# Patient Record
Sex: Male | Born: 1935 | Race: White | Hispanic: No | Smoking: Former smoker
Health system: Southern US, Community
[De-identification: ages and names within clinical notes are randomized; demographics above are authoritative.]

## PROBLEM LIST (undated history)

## (undated) DIAGNOSIS — I10 Essential (primary) hypertension: Secondary | ICD-10-CM

## (undated) DIAGNOSIS — E785 Hyperlipidemia, unspecified: Secondary | ICD-10-CM

## (undated) DIAGNOSIS — N400 Enlarged prostate without lower urinary tract symptoms: Secondary | ICD-10-CM

## (undated) DIAGNOSIS — H409 Unspecified glaucoma: Secondary | ICD-10-CM

## (undated) DIAGNOSIS — I340 Nonrheumatic mitral (valve) insufficiency: Secondary | ICD-10-CM

## (undated) DIAGNOSIS — I509 Heart failure, unspecified: Secondary | ICD-10-CM

## (undated) DIAGNOSIS — I480 Paroxysmal atrial fibrillation: Secondary | ICD-10-CM

## (undated) DIAGNOSIS — I071 Rheumatic tricuspid insufficiency: Secondary | ICD-10-CM

## (undated) DIAGNOSIS — I351 Nonrheumatic aortic (valve) insufficiency: Secondary | ICD-10-CM

## (undated) HISTORY — DX: Nonrheumatic aortic (valve) insufficiency: I35.1

## (undated) HISTORY — PX: PILONIDAL CYST / SINUS EXCISION: SUR543

## (undated) HISTORY — DX: Rheumatic tricuspid insufficiency: I07.1

## (undated) HISTORY — DX: Nonrheumatic mitral (valve) insufficiency: I34.0

## (undated) HISTORY — DX: Paroxysmal atrial fibrillation: I48.0

## (undated) HISTORY — PX: EYE SURGERY: SHX253

---

## 1998-12-25 ENCOUNTER — Other Ambulatory Visit: Admission: RE | Admit: 1998-12-25 | Discharge: 1998-12-25 | Payer: Self-pay | Admitting: Urology

## 2001-12-28 ENCOUNTER — Ambulatory Visit (HOSPITAL_COMMUNITY): Admission: RE | Admit: 2001-12-28 | Discharge: 2001-12-28 | Payer: Self-pay | Admitting: Gastroenterology

## 2011-05-08 NOTE — Procedures (Signed)
[facility]  Patient:    WES, LEZOTTE Visit Number: [id number] MRN: [id number]          Service Type: END Location: ENDO Attending Physician:  Rich Brave Dictated by:   Florencia Reasons, M.D. Proc. Date: 12/28/01 Admit Date:  12/28/2001   CC:         Al Decant. Janey Greaser, M.D.   Procedure Report  PROCEDURE:  Colonoscopy.  SURGEON:  Florencia Reasons, M.D.  INDICATIONS:  Screening for colon cancer in a 77 year old gentleman.  FINDINGS:  Pancolonic diverticulosis.  DESCRIPTION OF PROCEDURE:  The nature, purpose, and risks of the procedure had been discussed with the patient who provided written consent.  Sedation was fentanyl 75 mcg and Versed 9 mg IV without arrhythmias or desaturation. Digital exam of the prostate was unremarkable.  The Olympus adult video colonoscope was advanced without significant difficulty to the cecum as identified by clear visualization of the appendiceal orifice, after which pull back was initiated.  The prep was very good with just a little bit of residual stool film here and there which was irrigated away when necessary for adequate visualization.  The main finding on this exam was extensive pancolonic diverticulosis.  No polyps, cancer, colitis, or vascular malformations were observed, and retroflexion in the rectum was normal.  No biopsies were obtained.  The patient tolerated the procedure well and there were no apparent complications.  IMPRESSION:  Pancolonic diverticulosis, otherwise normal examination to the cecum.  PLAN:  Consider screening flexible sigmoidoscopy in five years and perhaps a follow up colonoscopy in 10 years if the patient remains in good general health. Dictated by:   Florencia Reasons, M.D. Attending Physician:  Rich Brave DD:  12/28/01 TD:  12/28/01 Job: [id number]

## 2013-08-31 ENCOUNTER — Other Ambulatory Visit (HOSPITAL_COMMUNITY): Payer: Self-pay | Admitting: Family Medicine

## 2013-08-31 DIAGNOSIS — R42 Dizziness and giddiness: Secondary | ICD-10-CM

## 2013-08-31 DIAGNOSIS — R011 Cardiac murmur, unspecified: Secondary | ICD-10-CM

## 2013-09-04 ENCOUNTER — Ambulatory Visit (HOSPITAL_COMMUNITY)
Admission: RE | Admit: 2013-09-04 | Discharge: 2013-09-04 | Disposition: A | Discharge status: Home / Self Care | Payer: Medicare Other | Source: Ambulatory Visit | Attending: Family Medicine | Admitting: Family Medicine

## 2013-09-04 DIAGNOSIS — R011 Cardiac murmur, unspecified: Secondary | ICD-10-CM

## 2013-09-04 DIAGNOSIS — R42 Dizziness and giddiness: Secondary | ICD-10-CM

## 2013-09-04 NOTE — Progress Notes (Signed)
  Echocardiogram 2D Echocardiogram has been performed.  Barry Burgess FRANCES 09/04/2013, 2:55 PM

## 2015-10-14 ENCOUNTER — Encounter (HOSPITAL_COMMUNITY): Payer: Self-pay | Admitting: Emergency Medicine

## 2015-10-14 ENCOUNTER — Inpatient Hospital Stay (HOSPITAL_COMMUNITY)
Admission: EM | Admit: 2015-10-14 | Discharge: 2015-10-16 | DRG: 309 | Disposition: A | Discharge status: Home / Self Care | Payer: Medicare Other | Attending: Internal Medicine | Admitting: Internal Medicine

## 2015-10-14 ENCOUNTER — Emergency Department (HOSPITAL_COMMUNITY): Payer: Medicare Other

## 2015-10-14 DIAGNOSIS — E785 Hyperlipidemia, unspecified: Secondary | ICD-10-CM | POA: Diagnosis present

## 2015-10-14 DIAGNOSIS — I4891 Unspecified atrial fibrillation: Secondary | ICD-10-CM

## 2015-10-14 DIAGNOSIS — I10 Essential (primary) hypertension: Secondary | ICD-10-CM | POA: Diagnosis present

## 2015-10-14 DIAGNOSIS — Z87891 Personal history of nicotine dependence: Secondary | ICD-10-CM | POA: Diagnosis not present

## 2015-10-14 DIAGNOSIS — Z7982 Long term (current) use of aspirin: Secondary | ICD-10-CM | POA: Diagnosis not present

## 2015-10-14 DIAGNOSIS — N400 Enlarged prostate without lower urinary tract symptoms: Secondary | ICD-10-CM | POA: Diagnosis present

## 2015-10-14 DIAGNOSIS — I169 Hypertensive crisis, unspecified: Secondary | ICD-10-CM | POA: Diagnosis present

## 2015-10-14 DIAGNOSIS — Z79899 Other long term (current) drug therapy: Secondary | ICD-10-CM | POA: Diagnosis not present

## 2015-10-14 DIAGNOSIS — H409 Unspecified glaucoma: Secondary | ICD-10-CM | POA: Diagnosis not present

## 2015-10-14 HISTORY — DX: Essential (primary) hypertension: I10

## 2015-10-14 HISTORY — DX: Unspecified atrial fibrillation: I48.91

## 2015-10-14 HISTORY — DX: Hyperlipidemia, unspecified: E78.5

## 2015-10-14 HISTORY — DX: Unspecified glaucoma: H40.9

## 2015-10-14 HISTORY — DX: Benign prostatic hyperplasia without lower urinary tract symptoms: N40.0

## 2015-10-14 LAB — HEPATIC FUNCTION PANEL
ALBUMIN: 4 g/dL (ref 3.5–5.0)
ALK PHOS: 58 U/L (ref 38–126)
ALT: 17 U/L (ref 17–63)
AST: 22 U/L (ref 15–41)
Bilirubin, Direct: 0.2 mg/dL (ref 0.1–0.5)
Indirect Bilirubin: 0.9 mg/dL (ref 0.3–0.9)
TOTAL PROTEIN: 6.5 g/dL (ref 6.5–8.1)
Total Bilirubin: 1.1 mg/dL (ref 0.3–1.2)

## 2015-10-14 LAB — BASIC METABOLIC PANEL
ANION GAP: 5 (ref 5–15)
BUN: 17 mg/dL (ref 6–20)
CALCIUM: 8.7 mg/dL — AB (ref 8.9–10.3)
CO2: 27 mmol/L (ref 22–32)
CREATININE: 1.06 mg/dL (ref 0.61–1.24)
Chloride: 110 mmol/L (ref 101–111)
GFR calc Af Amer: >60 mL/min (ref >60)
GLUCOSE: 145 mg/dL — AB (ref 65–99)
Potassium: 4.2 mmol/L (ref 3.5–5.1)
Sodium: 142 mmol/L (ref 135–145)

## 2015-10-14 LAB — TSH: TSH: 1.487 u[IU]/mL (ref 0.350–4.500)

## 2015-10-14 LAB — CBC
HCT: 43.4 % (ref 39.0–52.0)
Hemoglobin: 14 g/dL (ref 13.0–17.0)
MCH: 30.8 pg (ref 26.0–34.0)
MCHC: 32.3 g/dL (ref 30.0–36.0)
MCV: 95.4 fL (ref 78.0–100.0)
PLATELETS: 170 10*3/uL (ref 150–400)
RBC: 4.55 MIL/uL (ref 4.22–5.81)
RDW: 13.5 % (ref 11.5–15.5)
WBC: 5.6 10*3/uL (ref 4.0–10.5)

## 2015-10-14 LAB — T4, FREE: Free T4: 1.25 ng/dL — ABNORMAL HIGH (ref 0.61–1.12)

## 2015-10-14 LAB — PROTIME-INR
INR: 1.09 (ref 0.00–1.49)
PROTHROMBIN TIME: 14.3 s (ref 11.6–15.2)

## 2015-10-14 LAB — TROPONIN I
Troponin I: <0.03 ng/mL (ref <0.031)
Troponin I: <0.03 ng/mL (ref <0.031)

## 2015-10-14 LAB — APTT: APTT: 31 s (ref 24–37)

## 2015-10-14 LAB — MRSA PCR SCREENING: MRSA by PCR: NEGATIVE

## 2015-10-14 LAB — PHOSPHORUS: Phosphorus: 3 mg/dL (ref 2.5–4.6)

## 2015-10-14 LAB — MAGNESIUM: MAGNESIUM: 1.8 mg/dL (ref 1.7–2.4)

## 2015-10-14 LAB — BRAIN NATRIURETIC PEPTIDE: B NATRIURETIC PEPTIDE 5: 345.1 pg/mL — AB (ref 0.0–100.0)

## 2015-10-14 MED ORDER — LATANOPROST 0.005 % OP SOLN
1.0000 [drp] | Freq: Every day | OPHTHALMIC | Status: DC
  Filled 2015-10-14: qty 2.5

## 2015-10-14 MED ORDER — DILTIAZEM LOAD VIA INFUSION
10.0000 mg | Freq: Once | INTRAVENOUS | Status: AC
  Administered 2015-10-14: 10 mg via INTRAVENOUS
  Filled 2015-10-14: qty 10

## 2015-10-14 MED ORDER — ACETAMINOPHEN 650 MG RE SUPP: 650.0000 mg | Freq: Four times a day (QID) | RECTAL | Status: DC | PRN

## 2015-10-14 MED ORDER — FINASTERIDE 5 MG PO TABS
5.0000 mg | ORAL_TABLET | Freq: Every day | ORAL | Status: DC
  Administered 2015-10-15 – 2015-10-16 (×2): 5 mg via ORAL
  Filled 2015-10-14 (×2): qty 1

## 2015-10-14 MED ORDER — ONDANSETRON HCL 4 MG PO TABS: 4.0000 mg | ORAL_TABLET | Freq: Four times a day (QID) | ORAL | Status: DC | PRN

## 2015-10-14 MED ORDER — ASPIRIN EC 81 MG PO TBEC
81.0000 mg | DELAYED_RELEASE_TABLET | Freq: Every day | ORAL | Status: DC
  Administered 2015-10-14 – 2015-10-15 (×2): 81 mg via ORAL
  Filled 2015-10-14 (×4): qty 1

## 2015-10-14 MED ORDER — HEPARIN (PORCINE) IN NACL 100-0.45 UNIT/ML-% IJ SOLN
1050.0000 [IU]/h | INTRAMUSCULAR | Status: AC
  Administered 2015-10-14: 1200 [IU]/h via INTRAVENOUS
  Administered 2015-10-15: 1050 [IU]/h via INTRAVENOUS
  Filled 2015-10-14 (×3): qty 250

## 2015-10-14 MED ORDER — HEPARIN BOLUS VIA INFUSION
4000.0000 [IU] | Freq: Once | INTRAVENOUS | Status: AC
  Administered 2015-10-14: 4000 [IU] via INTRAVENOUS
  Filled 2015-10-14: qty 4000

## 2015-10-14 MED ORDER — DOXAZOSIN MESYLATE 4 MG PO TABS
4.0000 mg | ORAL_TABLET | Freq: Every day | ORAL | Status: DC
  Administered 2015-10-14 – 2015-10-15 (×2): 4 mg via ORAL
  Filled 2015-10-14: qty 1
  Filled 2015-10-14 (×2): qty 4
  Filled 2015-10-14: qty 1

## 2015-10-14 MED ORDER — ONDANSETRON HCL 4 MG/2ML IJ SOLN: 4.0000 mg | Freq: Four times a day (QID) | INTRAMUSCULAR | Status: DC | PRN

## 2015-10-14 MED ORDER — DORZOLAMIDE HCL-TIMOLOL MAL 2-0.5 % OP SOLN
1.0000 [drp] | Freq: Two times a day (BID) | OPHTHALMIC | Status: DC
  Administered 2015-10-14 – 2015-10-16 (×4): 1 [drp] via OPHTHALMIC
  Filled 2015-10-14: qty 10

## 2015-10-14 MED ORDER — ICAPS PO CAPS: 1.0000 | ORAL_CAPSULE | Freq: Every day | ORAL | Status: DC

## 2015-10-14 MED ORDER — SODIUM CHLORIDE 0.9 % IJ SOLN: 3.0000 mL | INTRAMUSCULAR | Status: DC | PRN

## 2015-10-14 MED ORDER — SODIUM CHLORIDE 0.9 % IV SOLN
250.0000 mL | INTRAVENOUS | Status: DC | PRN
Start: 2015-10-14 — End: 2015-10-16

## 2015-10-14 MED ORDER — SODIUM CHLORIDE 0.9 % IJ SOLN
3.0000 mL | Freq: Two times a day (BID) | INTRAMUSCULAR | Status: DC
  Administered 2015-10-15 – 2015-10-16 (×2): 3 mL via INTRAVENOUS

## 2015-10-14 MED ORDER — PROSIGHT PO TABS
1.0000 | ORAL_TABLET | Freq: Every day | ORAL | Status: DC
  Administered 2015-10-16: 1 via ORAL
  Filled 2015-10-14 (×2): qty 1

## 2015-10-14 MED ORDER — ATORVASTATIN CALCIUM 40 MG PO TABS
40.0000 mg | ORAL_TABLET | Freq: Every day | ORAL | Status: DC
  Administered 2015-10-15: 40 mg via ORAL
  Filled 2015-10-14: qty 1

## 2015-10-14 MED ORDER — ACETAMINOPHEN 325 MG PO TABS: 650.0000 mg | ORAL_TABLET | Freq: Four times a day (QID) | ORAL | Status: DC | PRN

## 2015-10-14 MED ORDER — DILTIAZEM HCL 100 MG IV SOLR
5.0000 mg/h | INTRAVENOUS | Status: DC
  Administered 2015-10-14: 12.5 mg/h via INTRAVENOUS
  Administered 2015-10-14: 10 mg/h via INTRAVENOUS
  Filled 2015-10-14 (×2): qty 100

## 2015-10-14 NOTE — ED Notes (Signed)
Lenis DickinsonAlaina, RN 2nd RN verified Heparin administration.

## 2015-10-14 NOTE — Progress Notes (Signed)
ANTICOAGULATION CONSULT NOTE - Initial Consult  Pharmacy Consult for Heparin Indication: atrial fibrillation  No Known Allergies  Patient Measurements: Height: 6' (182.9 cm) Weight: 175 lb (79.379 kg) IBW/kg (Calculated) : 77.6 Heparin Dosing Weight: 79 kg   Vital Signs: Temp: 97.9 F (36.6 C) (10/24 1554) Temp Source: Oral (10/24 1554) BP: 161/115 mmHg (10/24 1638) Pulse Rate: 119 (10/24 1638)  Labs: No results for input(s): HGB, HCT, PLT, APTT, LABPROT, INR, HEPARINUNFRC, CREATININE, CKTOTAL, CKMB, TROPONINI in the last 72 hours.  CrCl cannot be calculated (Patient has no serum creatinine result on file.).   Medical History: Past Medical History  Diagnosis Date  . Hypertension   . Hyperlipidemia     Medications:  Scheduled:  . heparin  4,000 Units Intravenous Once   Infusions:  . diltiazem (CARDIZEM) infusion 10 mg/hr (10/14/15 1628)  . heparin      Assessment:  [age] yr male sent by primary MD to ED due to rapid and irregular heart rate with hypertension.  EKG shows AFib with RVR  IV Heparin per pharmacy dosing ordered  Baseline aPTT, INR and CBC already ordered  Per medication history, patient not on oral anticoagulation prior to admission  Goal of Therapy:  Heparin level 0.3-0.7 units/ml Monitor platelets by anticoagulation protocol: Yes   Plan:  Heparin 4000 unit IV bolus x 1 Heparin infusion @ 1200 units/hr Check heparin level 8 hr after heparin started Follow heparin level & CBC daily while on heparin  Kani Jobson, Joselyn GlassmanLeann Trefz, PharmD 10/14/2015,4:41 PM

## 2015-10-14 NOTE — ED Provider Notes (Addendum)
CSN: [id number]     Arrival date & time 10/14/15  1530 History   First MD Initiated Contact with Patient 10/14/15 1558     Chief Complaint  Patient presents with  . elevated HR/BP      (Consider location/radiation/quality/duration/timing/severity/associated sxs/prior Treatment) HPI Comments: Patient presents to the emergency department as a referral from his primary care doctor. Patient was seen in his doctor's office earlier and was found to have a rapid and irregular heart rate as well as hypertension. Patient reports that his ankles have been more swollen than usual but he is not experiencing any chest pain, palpitations, shortness of breath. He was recently taken off his hydrochlorothiazide and had his Cardura dose cut in half.   Past Medical History  Diagnosis Date  . Hypertension   . Hyperlipidemia    History reviewed. No pertinent past surgical history. No family history on file. Social History  Substance Use Topics  . Smoking status: Never Smoker   . Smokeless tobacco: None  . Alcohol Use: No    Review of Systems  Respiratory: Negative for shortness of breath.   Cardiovascular: Positive for leg swelling. Negative for chest pain.  All other systems reviewed and are negative.     Allergies  Review of patient's allergies indicates not on file.  Home Medications   Prior to Admission medications   Not on File   BP 178/118 mmHg  Pulse 143  Temp(Src) 97.9 F (36.6 C) (Oral)  Resp 18  SpO2 100% Physical Exam  Constitutional: He is oriented to person, place, and time. He appears well-developed and well-nourished. No distress.  HENT:  Head: Normocephalic and atraumatic.  Right Ear: Hearing normal.  Left Ear: Hearing normal.  Nose: Nose normal.  Mouth/Throat: Oropharynx is clear and moist and mucous membranes are normal.  Eyes: Conjunctivae and EOM are normal. Pupils are equal, round, and reactive to light.  Neck: Normal range of motion. Neck supple.   Cardiovascular: S1 normal and S2 normal.  An irregularly irregular rhythm present. Tachycardia present.  Exam reveals no gallop and no friction rub.   No murmur heard. Pulmonary/Chest: Effort normal and breath sounds normal. No respiratory distress. He exhibits no tenderness.  Abdominal: Soft. Normal appearance and bowel sounds are normal. There is no hepatosplenomegaly. There is no tenderness. There is no rebound, no guarding, no tenderness at McBurney's point and negative Murphy's sign. No hernia.  Musculoskeletal: Normal range of motion. He exhibits edema.  Neurological: He is alert and oriented to person, place, and time. He has normal strength. No cranial nerve deficit or sensory deficit. Coordination normal. GCS eye subscore is 4. GCS verbal subscore is 5. GCS motor subscore is 6.  Skin: Skin is warm, dry and intact. No rash noted. No cyanosis.  Psychiatric: He has a normal mood and affect. His speech is normal and behavior is normal. Thought content normal.  Nursing note and vitals reviewed.   ED Course  Procedures (including critical care time) Labs Review Labs Reviewed  CBC  BASIC METABOLIC PANEL  TROPONIN I  TSH  PROTIME-INR  APTT  BRAIN NATRIURETIC PEPTIDE    Imaging Review No results found. I have personally reviewed and evaluated these images and lab results as part of my medical decision-making.   EKG Interpretation None      ED ECG REPORT   Date: 10/14/2015  Rate: 139  Rhythm: atrial fibrillation  QRS Axis: normal  Intervals: no P waves  ST/T Wave abnormalities: normal  Conduction Disutrbances:none  Narrative Interpretation:   Old EKG Reviewed: none available  I have personally reviewed the EKG tracing and agree with the computerized printout as noted.   MDM   Final diagnoses:  Atrial fibrillation with RVR (HCC)    Patient presents to the emergency room for evaluation of elevated heart rate and elevated blood pressure. Patient does have a history  of hypertension. He recently had a pressure medications adjusted. He was at his doctor for follow-up when he was noted to have a blood pressure that was very elevated and also heart rate in the 140-150 range. EKG shows atrial fibrillation with rapid ventricular response. Patient is asymptomatic, onset time is unknown. CHAD2DS2-VASc score = 3, so anticoagulation was initiated. Patient initiated on Cardizem bolus and drip for rate control. He will require hospitalization for further management.  CRITICAL CARE Performed by: Gilda Crease   Total critical care time:  Critical care time was exclusive of separately billable procedures and treating other patients.  Critical care was necessary to treat or prevent imminent or life-threatening deterioration.  Critical care was time spent personally by me on the following activities: development of treatment plan with patient and/or surrogate as well as nursing, discussions with consultants, evaluation of patient's response to treatment, examination of patient, obtaining history from patient or surrogate, ordering and performing treatments and interventions, ordering and review of laboratory studies, ordering and review of radiographic studies, pulse oximetry and re-evaluation of patient's condition.   Addendum: Discussed briefly with Dr. [place]. Patient will be seen by cardiology in the morning.  Gilda Crease, MD 10/14/15 0865  Gilda Crease, MD 10/14/15 [phone]

## 2015-10-14 NOTE — ED Notes (Signed)
MD at bedside. 

## 2015-10-14 NOTE — ED Notes (Signed)
Per patient, was at PCPs office and BP and HR were elevated-ankle swelling-was taking off HCTZ and BP pill dose was lowered

## 2015-10-14 NOTE — H&P (Signed)
[facility] History and Physical  Barry Burgess OAC:[id number] DOB: 09/24/1926 DOA: 10/14/2015  Referring physician: Dr. Blinda LeatherwoodPollina  PCP: Mickie HillierLITTLE,KEVIN LORNE, MD   Chief Complaint: hypertensive crisis and Irregular rhythm and HR  HPI: Barry Burgess is a 81 y.o. male with PMH significant for HTN, HLD, BPH and Glaucoma; who came to ED from PCP office to be evaluated for irregular heart rhythm and hypertensive crisis. Patient reports noticing LE swelling (pedal edema) for the last week and also noticing his BP at home to be higher than usual. Went to see PCP for further evaluation and was found to have on A. Fib with RVR and with hypertensive crisis. Patient sent to ED for further evaluation and treatment. Patient denies CP, palpitations, diaphoresis, SOB, orthopnea, HA's, lightheadedness, blurred vision, abd pain, cough, dysuria and any overt bleeding.  In the ED HR up to 140-150, A. Fib with RVR confirmed and BP of 178/118 (was 220/120 at PCP office according to patient). Patient started on cardizem drip and heparin drip. [facility] called to admit patient for further evaluation and treatment.   Review of Systems:  Negative except as mentioned on HPI  Past Medical History  Diagnosis Date  . Hypertension   . Hyperlipidemia    Past Surgical History  Procedure Laterality Date  . Pilonidal cyst / sinus excision    . Eye surgery      bilateral cataracts   Social History:  reports that he quit smoking about 53 years ago. His smoking use included Pipe and Cigars. He has never used smokeless tobacco. He reports that he drinks alcohol. He reports that he does not use illicit drugs.  No Known Allergies  Family hx: significant for HTN, otherwise unremarkable   Prior to Admission medications   Medication Sig Start Date End Date Taking? Authorizing Provider  aspirin EC 81 MG tablet Take 81 mg by mouth daily.   Yes Historical Provider, MD  dorzolamide-timolol (COSOPT) 22.3-6.8 MG/ML  ophthalmic solution Place 1 drop into both eyes 2 (two) times daily. 10/07/15  Yes Historical Provider, MD  doxazosin (CARDURA) 4 MG tablet Take 4 mg by mouth daily. 10/11/15  Yes Historical Provider, MD  finasteride (PROSCAR) 5 MG tablet Take 5 mg by mouth daily. 08/14/15  Yes Historical Provider, MD  latanoprost (XALATAN) 0.005 % ophthalmic solution Place 1 drop into both eyes every morning. 10/07/15  Yes Historical Provider, MD  Multiple Vitamins-Minerals (ICAPS PO) Take 1 tablet by mouth daily.   Yes Historical Provider, MD  simvastatin (ZOCOR) 80 MG tablet Take 40 mg by mouth every evening. 09/10/15  Yes Historical Provider, MD   Physical Exam: Filed Vitals:   10/14/15 1900 10/14/15 1915 10/14/15 1930 10/14/15 1938  BP: 154/85 154/89 163/95 154/89  Pulse: 88 94 99 86  Temp:      TempSrc:      Resp: 20 21 12 22   Height:      Weight:      SpO2: 95% 94% 95% 94%    Wt Readings from Last 3 Encounters:  10/14/15 79.379 kg (175 lb)    General:  Appears calm and comfortable, denies CP, SOB, nausea, vomiting, diaphoresis and palpitations. Patient AAOX3. Eyes: PERRL, normal lids, irises & conjunctiva; no nystagmus; patient with right lazy eye/starbismus (intermittent and chronic) vision not affected  ENT: grossly normal hearing, lips & tongue, no erythema, no exudates, fair dentition; no ears or nostrils drainage  Neck: no LAD, masses or thyromegaly, no JVD Cardiovascular: irregular irregular, no m/r/g. Trace  to 1+ LE edema bilaterally. Telemetry: no P waves; trace consistent with A. fib Respiratory: CTA bilaterally, no w/r/r. Normal respiratory effort. Abdomen: soft, nt, ND, positive BS Skin: no rash or induration seen on exam Musculoskeletal: grossly normal tone BUE/BLE Psychiatric: grossly normal mood and affect, speech fluent and appropriate Neurologic: grossly non-focal.          Labs on Admission:  Basic Metabolic Panel:  Recent Labs Lab 10/14/15 1613  NA 142  K 4.2  CL  110  CO2 27  GLUCOSE 145*  BUN 17  CREATININE 1.06  CALCIUM 8.7*   CBC:  Recent Labs Lab 10/14/15 1613  WBC 5.6  HGB 14.0  HCT 43.4  MCV 95.4  PLT 170   Cardiac Enzymes:  Recent Labs Lab 10/14/15 1613  TROPONINI <0.03    BNP (last 3 results)  Recent Labs  10/14/15 1613  BNP 345.1*    Radiological Exams on Admission: Dg Chest Port 1 View  10/14/2015  CLINICAL DATA:  Rapid and irregular heart rate.  Hypertension. EXAM: PORTABLE CHEST 1 VIEW COMPARISON:  None. FINDINGS: AP portable views of the chest were obtained. There is lucency in the retrocardiac space and suspect a large hiatal hernia. Subtle densities at both lung bases could represent pleural effusions with atelectasis or consolidation. Lung markings are slightly prominent without frank pulmonary edema. Heart size is upper limits of normal. IMPRESSION: Bibasilar chest densities could represent a combination of atelectasis/consolidation with small effusions. Question a large hiatal hernia. Electronically Signed   By: Richarda Overlie M.D.   On: 10/14/2015 16:44    EKG:  Date: 10/14/2015 Rate: 139 Rhythm: atrial fibrillation QRS Axis: normal Intervals: no P waves appreciated  ST/T Wave abnormalities: none Narrative Interpretation: Positive A. Fib with RVR, no acute ischemic changes appreciated; HR 139 Old EKG Reviewed: none available   Assessment/Plan 1-Hypertensive crisis and A.fib with RVR: new onset -patient is asymptomatic (no palpitations, no CP, no diaphoresis) -will admit to stepdown -will start cardizem drip -started on heparin drip -will cycle troponin, will check 2-D echo, will check electrolytes (especially magnesium, phosphorus and K) and thyroid function -no signs or symptoms of infection -CHADsVasc Score 3 -cardiology consulted, will follow rec's  2-Atrial fibrillation with RVR [facility]): treatment as mentioned above. -CHADsVASC score 3  3-HLD (hyperlipidemia): will continue  statins -will check lipid profile  4-Glaucoma: will continue current eye drops regimen (xalanta, cosopt)  5-BPH: no complaints of urinary retention -will continue cardura and proscar  Cardiology (Dr. Swaziland consulted by ED physician)  Code Status: DNR DVT Prophylaxis:on heparin drip Family Communication: no family at bedside Disposition Plan: LOS > 2 midnights, inpatient, stepdown  Time spent: 55 minutes  Vassie Loll [facility] Pager [phone]

## 2015-10-15 ENCOUNTER — Inpatient Hospital Stay (HOSPITAL_COMMUNITY): Payer: Medicare Other

## 2015-10-15 ENCOUNTER — Encounter (HOSPITAL_COMMUNITY): Payer: Self-pay | Admitting: Student

## 2015-10-15 DIAGNOSIS — E785 Hyperlipidemia, unspecified: Secondary | ICD-10-CM

## 2015-10-15 DIAGNOSIS — N400 Enlarged prostate without lower urinary tract symptoms: Secondary | ICD-10-CM

## 2015-10-15 DIAGNOSIS — I4891 Unspecified atrial fibrillation: Secondary | ICD-10-CM

## 2015-10-15 DIAGNOSIS — I10 Essential (primary) hypertension: Secondary | ICD-10-CM | POA: Diagnosis present

## 2015-10-15 LAB — LIPID PANEL
CHOL/HDL RATIO: 1.8 ratio
Cholesterol: 93 mg/dL (ref 0–200)
HDL: 53 mg/dL (ref >40)
LDL Cholesterol: 35 mg/dL (ref 0–99)
Triglycerides: 27 mg/dL (ref <150)
VLDL: 5 mg/dL (ref 0–40)

## 2015-10-15 LAB — BASIC METABOLIC PANEL
ANION GAP: 6 (ref 5–15)
BUN: 19 mg/dL (ref 6–20)
CALCIUM: 8.6 mg/dL — AB (ref 8.9–10.3)
CO2: 27 mmol/L (ref 22–32)
Chloride: 108 mmol/L (ref 101–111)
Creatinine, Ser: 1.07 mg/dL (ref 0.61–1.24)
GFR calc Af Amer: >60 mL/min (ref >60)
GFR calc non Af Amer: >60 mL/min (ref >60)
GLUCOSE: 159 mg/dL — AB (ref 65–99)
POTASSIUM: 3.8 mmol/L (ref 3.5–5.1)
Sodium: 141 mmol/L (ref 135–145)

## 2015-10-15 LAB — CBC
HEMATOCRIT: 39.3 % (ref 39.0–52.0)
HEMOGLOBIN: 12.7 g/dL — AB (ref 13.0–17.0)
MCH: 31.1 pg (ref 26.0–34.0)
MCHC: 32.3 g/dL (ref 30.0–36.0)
MCV: 96.3 fL (ref 78.0–100.0)
Platelets: 161 10*3/uL (ref 150–400)
RBC: 4.08 MIL/uL — ABNORMAL LOW (ref 4.22–5.81)
RDW: 13.7 % (ref 11.5–15.5)
WBC: 5.1 10*3/uL (ref 4.0–10.5)

## 2015-10-15 LAB — HEPARIN LEVEL (UNFRACTIONATED)
HEPARIN UNFRACTIONATED: 0.54 [IU]/mL (ref 0.30–0.70)
Heparin Unfractionated: 0.81 IU/mL — ABNORMAL HIGH (ref 0.30–0.70)

## 2015-10-15 LAB — TROPONIN I: Troponin I: <0.03 ng/mL (ref <0.031)

## 2015-10-15 MED ORDER — LOSARTAN POTASSIUM 50 MG PO TABS
25.0000 mg | ORAL_TABLET | Freq: Every day | ORAL | Status: DC
  Administered 2015-10-15: 25 mg via ORAL
  Filled 2015-10-15 (×2): qty 1

## 2015-10-15 MED ORDER — DILTIAZEM HCL 60 MG PO TABS
60.0000 mg | ORAL_TABLET | Freq: Four times a day (QID) | ORAL | Status: DC
  Administered 2015-10-15 – 2015-10-16 (×4): 60 mg via ORAL
  Filled 2015-10-15 (×4): qty 1

## 2015-10-15 MED ORDER — RIVAROXABAN 15 MG PO TABS
15.0000 mg | ORAL_TABLET | Freq: Every day | ORAL | Status: DC
  Administered 2015-10-15: 15 mg via ORAL
  Filled 2015-10-15: qty 1

## 2015-10-15 NOTE — Progress Notes (Signed)
ANTICOAGULATION CONSULT NOTE - F/u Consult  Pharmacy Consult for Heparin Indication: atrial fibrillation  No Known Allergies  Patient Measurements: Height: 6' (182.9 cm) Weight: 170 lb 10.2 oz (77.4 kg) IBW/kg (Calculated) : 77.6 Heparin Dosing Weight: 79 kg   Vital Signs: Temp: 98.3 F (36.8 C) (10/25 0000) Temp Source: Oral (10/25 0000) BP: 99/60 mmHg (10/25 0200) Pulse Rate: 63 (10/25 0200)  Labs:  Recent Labs  10/14/15 1613 10/14/15 2100 10/15/15 0214  HGB 14.0  --  12.7*  HCT 43.4  --  39.3  PLT 170  --  161  APTT 31  --   --   LABPROT 14.3  --   --   INR 1.09  --   --   HEPARINUNFRC  --   --  0.81*  CREATININE 1.06  --  1.07  TROPONINI <0.03 <0.03 <0.03    Estimated Creatinine Clearance: 53.2 mL/min (by C-G formula based on Cr of 1.07).   Medical History: Past Medical History  Diagnosis Date  . Hypertension   . Hyperlipidemia     Medications:  Scheduled:  . aspirin EC  81 mg Oral Daily  . atorvastatin  40 mg Oral q1800  . dorzolamide-timolol  1 drop Both Eyes BID  . doxazosin  4 mg Oral Daily  . finasteride  5 mg Oral Daily  . latanoprost  1 drop Both Eyes QHS  . multivitamin  1 tablet Oral Daily  . sodium chloride  3 mL Intravenous Q12H   Infusions:  . diltiazem (CARDIZEM) infusion Stopped (10/15/15 0045)  . heparin 1,200 Units/hr (10/14/15 1722)    Assessment:  [age] yr male sent by primary MD to ED due to rapid and irregular heart rate with hypertension.  EKG shows AFib with RVR  IV Heparin per pharmacy dosing ordered  Baseline aPTT, INR and CBC already ordered  Per medication history, patient not on oral anticoagulation prior to admission Today, 10/25  0200 HL=0.81, no problems per RN, above goal  Goal of Therapy:  Heparin level 0.3-0.7 units/ml Monitor platelets by anticoagulation protocol: Yes   Plan:  Decrease heparin drip to 1050 units/hr Check heparin level 8 hr after heparin adjusted Follow heparin level & CBC daily  while on heparin  Lorenza EvangelistGreen, Tuere Nwosu R, PharmD 10/15/2015,3:16 AM

## 2015-10-15 NOTE — Progress Notes (Signed)
Pt transferred to 1424 via wheelchair with all belongings. Report given to RN and all questions answered.

## 2015-10-15 NOTE — Care Management Note (Signed)
Case Management Note  Patient Details  Name: Barry Burgess MRN: [id number] Date of Birth: 06/29/1926  Subjective/Objective:            A.fib        Action/Plan:Date: October 15, 2015 Chart reviewed for concurrent status and case management needs. Will continue to follow patient for changes and needs: Marcelle Smilinghonda Rosemae Mcquown, RN, BSN, [place]   [phone]   Expected Discharge Date:   (unknown)               Expected Discharge Plan:  Home/Self Care  In-House Referral:     Discharge planning Services  CM Consult  Post Acute Care Choice:  NA Choice offered to:  NA  DME Arranged:    DME Agency:     HH Arranged:    HH Agency:     Status of Service:  In process, will continue to follow  Medicare Important Message Given:    Date Medicare IM Given:    Medicare IM give by:    Date Additional Medicare IM Given:    Additional Medicare Important Message give by:     If discussed at Long Length of Stay Meetings, dates discussed:    Additional Comments:  Golda AcreDavis, Prentiss Polio Lynn, RN 10/15/2015, 10:49 AM

## 2015-10-15 NOTE — Progress Notes (Signed)
  Echocardiogram 2D Echocardiogram has been performed.  Tye SavoyCasey N Glorianne Proctor 10/15/2015, 2:06 PM

## 2015-10-15 NOTE — Consult Note (Signed)
CARDIOLOGY CONSULT NOTE   Patient ID: Barry NunneryCharles W Burgess MRN: [id number], DOB/AGE: 79/12/1925   Admit date: 10/14/2015 Date of Consult: 10/15/2015 Reason for  Consult: Atrial Fibrillation  Primary Physician: Mickie HillierLITTLE,KEVIN LORNE, MD Primary Cardiologist: New  HPI: Barry Burgess is a 81 y.o. male with past medical history of HTN, HLD, BPH, and Glaucoma who presented to [facility] ED on 10/14/2015 for elevated BP and HR. Was noted to be in atrial fibrillation with rates in 140's - 150's and BP was elevated to the 170's / 110's.   The patient reports his home BP medications have been reduced in the past few weeks secondary to dizziness in the early morning hours. His HCTZ had been discontinued and his Cardura dose had been reduced from 8mg  to 4mg . He had been monitoring his BP at home and said it has been elevated into the 170's and 180's over the past several days. When at his PCP yesterday, his BP was in the 180s and his pulse was in the 140's and an EKG showed atrial fibrillation.  Upon being admitted, he denies any palpitations, chest pain, shortness of breath, lightheadedness, dizziness, or presyncopal events. He does report trace edema in his lower extremities ever since stopping HCTZ.  He was started on Cardizem for rate control and Heparin for anticoagulation. His HR was in the 70's - 80's overnight and has been in the 90's - 130's this morning.   He reports having never seen a cardiologist in the past. Denies any past cardiac workup or history of atrial fibrillation. Reports his father passed of a stroke. No history of CAD in the patient personally or in his family. Exercises several times a week at [employer]. Lives with his son, but performs most ADL's independently.    Problem List Past Medical History  Diagnosis Date  . Hypertension   . Hyperlipidemia   . BPH (benign prostatic hyperplasia)   . Glaucoma     Past Surgical History  Procedure Laterality Date  . Pilonidal  cyst / sinus excision    . Eye surgery      bilateral cataracts     Allergies No Known Allergies    Inpatient Medications . aspirin EC  81 mg Oral Daily  . atorvastatin  40 mg Oral q1800  . diltiazem  60 mg Oral 4 times per day  . dorzolamide-timolol  1 drop Both Eyes BID  . doxazosin  4 mg Oral Daily  . finasteride  5 mg Oral Daily  . latanoprost  1 drop Both Eyes QHS  . losartan  25 mg Oral Daily  . multivitamin  1 tablet Oral Daily  . sodium chloride  3 mL Intravenous Q12H    Family History Family History  Problem Relation Age of Onset  . Stroke Father [id number]     Social History Social History   Social History  . Marital Status: Married    Spouse Name: N/A  . Number of Children: N/A  . Years of Education: N/A   Occupational History  . Not on file.   Social History Main Topics  . Smoking status: Former Smoker    Types: Pipe, Cigars    Quit date: 10/13/1962  . Smokeless tobacco: Never Used  . Alcohol Use: Yes     Comment: 1 glass wine per night  . Drug Use: No  . Sexual Activity: Not on file   Other Topics Concern  . Not on file   Social History Narrative  Review of Systems General:  No chills, fever, night sweats or weight changes.  Cardiovascular:  No chest pain, dyspnea on exertion, edema, orthopnea, palpitations, paroxysmal nocturnal dyspnea. Dermatological: No rash, lesions/masses Respiratory: No cough, dyspnea Urologic: No hematuria, dysuria Abdominal:   No nausea, vomiting, diarrhea, bright red blood per rectum, melena, or hematemesis Neurologic:  No visual changes, wkns, changes in mental status. All other systems reviewed and are otherwise negative except as noted above.  Physical Exam Blood pressure 147/83, pulse 77, temperature 97.4 F (36.3 C), temperature source Oral, resp. rate 17, height 6' (1.829 m), weight 170 lb 10.2 oz (77.4 kg), SpO2 95 %.  General: Pleasant, elderly Caucasian male in  NAD Psych: Normal affect. Neuro: Alert  and oriented X 3. Moves all extremities spontaneously. HEENT: Normal  Neck: Supple without bruits or JVD. Lungs:  Resp regular and unlabored, CTA without wheezing or rales. Heart: Irregularly irregular, no s3, s4, or murmurs. Abdomen: Soft, non-tender, non-distended, BS + x 4.  Extremities: No clubbing or cyanosis. Trace edema. DP/PT/Radials 2+ and equal bilaterally.  Labs  Recent Labs  10/14/15 1613 10/14/15 2100 10/15/15 0214 10/15/15 0900  TROPONINI <0.03 <0.03 <0.03 <0.03   Lab Results  Component Value Date   WBC 5.1 10/15/2015   HGB 12.7* 10/15/2015   HCT 39.3 10/15/2015   MCV 96.3 10/15/2015   PLT 161 10/15/2015     Recent Labs Lab 10/14/15 2100 10/15/15 0214  NA  --  141  K  --  3.8  CL  --  108  CO2  --  27  BUN  --  19  CREATININE  --  1.07  CALCIUM  --  8.6*  PROT 6.5  --   BILITOT 1.1  --   ALKPHOS 58  --   ALT 17  --   AST 22  --   GLUCOSE  --  159*   Lab Results  Component Value Date   CHOL 93 10/15/2015   HDL 53 10/15/2015   LDLCALC 35 10/15/2015   TRIG 27 10/15/2015   No results found for: DDIMER  Radiology/Studies  Dg Chest Port 1 View: 10/14/2015  CLINICAL DATA:  Rapid and irregular heart rate.  Hypertension. EXAM: PORTABLE CHEST 1 VIEW COMPARISON:  None. FINDINGS: AP portable views of the chest were obtained. There is lucency in the retrocardiac space and suspect a large hiatal hernia. Subtle densities at both lung bases could represent pleural effusions with atelectasis or consolidation. Lung markings are slightly prominent without frank pulmonary edema. Heart size is upper limits of normal. IMPRESSION: Bibasilar chest densities could represent a combination of atelectasis/consolidation with small effusions. Question a large hiatal hernia. Electronically Signed   By: Richarda Overlie M.D.   On: 10/14/2015 16:44    Telemetry: Atrial fibrillation with rates in 70's - 80's overnight. 90's - 130's this AM.  ECHOCARDIOGRAM: Pending   ASSESSMENT  AND PLAN  1. New Onset Atrial Fibrillation with RVR - presented in Atrial Fibrillation with RVR and rates in 140's - 150's. Was started on Cardizem drip and Heparin for anticoagulation. - This patients CHA2DS2-VASc Score and unadjusted Ischemic Stroke Rate (% per year) is equal to 3.2 % stroke rate/year from a score of 3 (HTN, Age > 75 x2).  - Will switch IV Cardizem to Cardizem  PO Q6H. Will consult Pharmacy for initiation of Xarelto.   2. Hypertensive Crisis - BP initially in the 170's / 110's. - has been 85/52 - 178/118 while admitted.  - will  start Cozaar  daily.  3. HLD - Lipid Panel showing LDL of 35, HDL of 53, Triglycerides of 27 and Total Cholesterol of 93. - continue statin therapy.  4. Glaucoma - per admitting team   Signed, Ellsworth Lennox, PA-C 10/15/2015, 10:35 AM Pager: [phone]   Patient examined chart reviewed discussed care with patient and son Exam with poor dentition. Lazy right eye.  Rapid afib SEM.  Lungs clear New onset afib risk factors age and HTN  CHADVASC  3  Stop heparin Start xarelto Discussed stroke risk and NOAC with patient and son.   Rx BP and rate with ARB and cardizem orally.  Echo  IF no valve disease And normal EF can see as outpatient and arrange [facility] in 3 weeks If EF low or signfiicant valve disease would arrange TEE/DCC Thursday  Charlton Haws

## 2015-10-15 NOTE — Progress Notes (Signed)
Patient ID: Barry Burgess, male   DOB: 1926-08-09, 81 y.o.   MRN: [id number] [facility] PROGRESS NOTE  Barry Burgess:[id number] DOB: January 31, 1926 DOA: 2015/11/02 PCP: Mickie Hillier, MD  Brief narrative:    81 y.o. male with past medical history of HTN, HLD, BPH who presented to [facility] ED with elevated blood pressure and he was found to be in atrial fibrillation with HR in 140-150 range. He was started on Cardizem IV and heparin drip for anticoagulation. Cardio has seen the pt in consultation.  Transfer to telemetry today.   Assessment/Plan:    Principal Problem:   Accelerated hypertension - BP currently controlled with Cardizem and losartan - Transfer to telemetry floor today  Active Problems:   Atrial fibrillation with RVR (HCC) - CHADS vasc score 3 (HTN, age x 2) - On AC with heparin - Change to xarelto per cardiology - Rate controlled with Cardizem     HLD (hyperlipidemia) - Lipid Panel showed LDL of 35, HDL of 53, Triglycerides of 27 and Total Cholesterol of 93. - Continue statin therapy.    BPH (benign prostatic hyperplasia) - Continue Proscar and Cardura   DVT Prophylaxis  - Currently on IV heparin, switch to xarelto per pharmacy  Code Status: Full.  Family Communication:  plan of care discussed with the patient Disposition Plan: Transfer tot telemetry today   IV access:  Peripheral IV  Procedures and diagnostic studies:    Dg Chest Port 1 View 11-02-15  Bibasilar chest densities could represent a combination of atelectasis/consolidation with small effusions. Question a large hiatal hernia. Electronically Signed   By: Richarda Overlie M.D.   On: 11/02/15 16:44    Medical Consultants:  Cardiology   Other Consultants:  None   IAnti-Infectives:   None    Manson Passey, MD  [facility] Pager [phone]  Time spent in minutes: 25 minutes  If 7PM-7AM, please contact night-coverage [email or web address] Password [facility] 10/15/2015, 12:39 PM   LOS: 1 day    HPI/Subjective: No acute overnight events. Patient reports he feels better.   Objective: Filed Vitals:   10/15/15 0750 10/15/15 0800 10/15/15 1000 10/15/15 1211  BP:  152/86 147/83   Pulse:  51 77   Temp: 97.4 F (36.3 C)   97.7 F (36.5 C)  TempSrc: Oral   Oral  Resp:  15 17   Height:      Weight:      SpO2:  97% 95%     Intake/Output Summary (Last 24 hours) at 10/15/15 1239 Last data filed at 10/15/15 0933  Gross per 24 hour  Intake  277.6 ml  Output    475 ml  Net -197.4 ml    Exam:   General:  Pt is alert, follows commands appropriately, not in acute distress  Cardiovascular: tachcyardic, S1/S2, no murmurs  Respiratory: Clear to auscultation bilaterally, no wheezing, no crackles, no rhonchi  Abdomen: Soft, non tender, non distended, bowel sounds present  Extremities: No edema, pulses DP and PT palpable bilaterally  Neuro: Grossly nonfocal  Data Reviewed: Basic Metabolic Panel:  Recent Labs Lab 11-02-15 1613 02-Nov-2015 2100 10/15/15 0214  NA 142  --  141  K 4.2  --  3.8  CL 110  --  108  CO2 27  --  27  GLUCOSE 145*  --  159*  BUN 17  --  19  CREATININE 1.06  --  1.07  CALCIUM 8.7*  --  8.6*  MG  --  1.8  --  PHOS  --  3.0  --    Liver Function Tests:  Recent Labs Lab 10/14/15 2100  AST 22  ALT 17  ALKPHOS 58  BILITOT 1.1  PROT 6.5  ALBUMIN 4.0   No results for input(s): LIPASE, AMYLASE in the last 168 hours. No results for input(s): AMMONIA in the last 168 hours. CBC:  Recent Labs Lab 10/14/15 1613 10/15/15 0214  WBC 5.6 5.1  HGB 14.0 12.7*  HCT 43.4 39.3  MCV 95.4 96.3  PLT 170 161   Cardiac Enzymes:  Recent Labs Lab 10/14/15 1613 10/14/15 2100 10/15/15 0214 10/15/15 0900  TROPONINI <0.03 <0.03 <0.03 <0.03   BNP: Invalid input(s): POCBNP CBG: No results for input(s): GLUCAP in the last 168 hours.  Recent Results (from the past 240 hour(s))  MRSA PCR Screening     Status: None   Collection Time:  10/14/15  9:00 PM  Result Value Ref Range Status   MRSA by PCR NEGATIVE NEGATIVE Final     Scheduled Meds: . aspirin EC  81 mg Oral Daily  . atorvastatin  40 mg Oral q1800  . diltiazem  60 mg Oral 4 times per day  . dorzolamide-timolol  1 drop Both Eyes BID  . doxazosin  4 mg Oral Daily  . finasteride  5 mg Oral Daily  . latanoprost  1 drop Both Eyes QHS  . losartan  25 mg Oral Daily  . multivitamin  1 tablet Oral Daily  . sodium chloride  3 mL Intravenous Q12H   Continuous Infusions: . heparin 1,050 Units/hr (10/15/15 1115)

## 2015-10-15 NOTE — Progress Notes (Signed)
ANTICOAGULATION CONSULT NOTE - Initial Consult  Pharmacy Consult for Heparin >> Xarelto Indication: atrial fibrillation  No Known Allergies  Patient Measurements: Height: 6' (182.9 cm) Weight: 170 lb 10.2 oz (77.4 kg) IBW/kg (Calculated) : 77.6  Vital Signs: Temp: 97.7 F (36.5 C) (10/25 1211) Temp Source: Oral (10/25 1211) BP: 147/83 mmHg (10/25 1000) Pulse Rate: 77 (10/25 1000)  Labs:  Recent Labs  10/14/15 1613 10/14/15 2100 10/15/15 0214 10/15/15 0900 10/15/15 1134  HGB 14.0  --  12.7*  --   --   HCT 43.4  --  39.3  --   --   PLT 170  --  161  --   --   APTT 31  --   --   --   --   LABPROT 14.3  --   --   --   --   INR 1.09  --   --   --   --   HEPARINUNFRC  --   --  0.81*  --  0.54  CREATININE 1.06  --  1.07  --   --   TROPONINI <0.03 <0.03 <0.03 <0.03  --     Estimated Creatinine Clearance: 53.2 mL/min (by C-G formula based on Cr of 1.07).   Medical History: Past Medical History  Diagnosis Date  . Hypertension   . Hyperlipidemia   . BPH (benign prostatic hyperplasia)   . Glaucoma     Medications:  Prescriptions prior to admission  Medication Sig Dispense Refill Last Dose  . aspirin EC 81 MG tablet Take 81 mg by mouth daily.   10/13/2015 at Unknown time  . dorzolamide-timolol (COSOPT) 22.3-6.8 MG/ML ophthalmic solution Place 1 drop into both eyes 2 (two) times daily.  6 10/13/2015 at Unknown time  . doxazosin (CARDURA) 4 MG tablet Take 4 mg by mouth daily.  2 10/13/2015 at Unknown time  . finasteride (PROSCAR) 5 MG tablet Take 5 mg by mouth daily.  2 10/14/2015 at Unknown time  . latanoprost (XALATAN) 0.005 % ophthalmic solution Place 1 drop into both eyes every morning.  8 10/14/2015 at Unknown time  . Multiple Vitamins-Minerals (ICAPS PO) Take 1 tablet by mouth daily.   10/14/2015 at Unknown time  . simvastatin (ZOCOR) 80 MG tablet Take 40 mg by mouth every evening.  1 10/13/2015 at Unknown time   Scheduled:  . aspirin EC  81 mg Oral Daily  .  atorvastatin  40 mg Oral q1800  . diltiazem  60 mg Oral 4 times per day  . dorzolamide-timolol  1 drop Both Eyes BID  . doxazosin  4 mg Oral Daily  . finasteride  5 mg Oral Daily  . latanoprost  1 drop Both Eyes QHS  . losartan  25 mg Oral Daily  . multivitamin  1 tablet Oral Daily  . rivaroxaban  15 mg Oral Q supper  . sodium chloride  3 mL Intravenous Q12H    Assessment: 37 yoM presenting with elevated BP and new onset Afib w/ RVR; started on heparin per pharmacy.  Today cardiology would like to transition to Xarelto.     Baseline INR, aPTT: wnl  Prior anticoagulation: none  CHA2DS2-VASc = 3   Today, 10/15/2015:  CBC: Hgb/Plt low-normal  Most recent heparin level at goal  Major drug interactions: ASA, diltiazem (mod 3A4 inhib - per package insert Xarelto not recommended with moderate inhibitors if CrCl 15-80 ml/min unless benefits of anticoagulation outweigh risks)  No bleeding issues per nursing  CrCl 53 ml/min (  baseline)  Goal of Therapy: Prevention of thromboembolic event  Plan:  Stop heparin at 1400 today  Start Xarelto 15 mg daily with supper, with first dose given as heparin is turned off.  Opting for lower Xarelto dose due to advanced age, concomitant diltiazem, and borderline renal function at baseline (dose would be reduced to 15 mg daily with CrCl 15-50 ml/min)  CBC at least q72 hr while on Xarelto; SCr weekly  Monitor for signs of bleeding or thrombosis   Bernadene Personrew Milissa Fesperman, PharmD Pager: [phone] 10/15/2015, 1:54 PM

## 2015-10-16 MED ORDER — DILTIAZEM HCL ER COATED BEADS 240 MG PO CP24: 240.0000 mg | ORAL_CAPSULE | Freq: Every day | ORAL | Status: DC

## 2015-10-16 MED ORDER — APIXABAN 5 MG PO TABS: 5.0000 mg | ORAL_TABLET | Freq: Two times a day (BID) | ORAL | Status: DC

## 2015-10-16 MED ORDER — DILTIAZEM HCL ER COATED BEADS 240 MG PO CP24
240.0000 mg | ORAL_CAPSULE | Freq: Every day | ORAL | Status: DC
  Administered 2015-10-16: 240 mg via ORAL
  Filled 2015-10-16: qty 1

## 2015-10-16 MED ORDER — LOSARTAN POTASSIUM 25 MG PO TABS: 25.0000 mg | ORAL_TABLET | Freq: Every day | ORAL | Status: DC

## 2015-10-16 MED ORDER — APIXABAN 5 MG PO TABS
5.0000 mg | ORAL_TABLET | Freq: Once | ORAL | Status: AC
  Administered 2015-10-16: 5 mg via ORAL
  Filled 2015-10-16: qty 1

## 2015-10-16 NOTE — Progress Notes (Signed)
Pt's co pay for Xarelto or Eliquis is $300.00. Prior authorization is needed by MD [id number]. Information given to Dr. Elisabeth Pigeonevine, pt and pt's son. Eliquis 30 days free trail card given to pt and pt's son.

## 2015-10-16 NOTE — Progress Notes (Signed)
Patient Name: Barry Burgess Date of Encounter: 10/16/2015  Principal Problem:   Accelerated hypertension Active Problems:   Atrial fibrillation with RVR (HCC)   HLD (hyperlipidemia)   BPH (benign prostatic hyperplasia)   Primary Cardiologist: New - Dr. Eden Emms Patient Profile: 81 y.o. male w/ PMH of HTN, HLD, BPH, and Glaucoma who presented to [facility] ED on 10/14/2015 for elevated BP and HR. Was noted to be in atrial fibrillation with rates in 140's - 150's and BP was elevated to the 170's / 110's.   SUBJECTIVE: Denies any chest pain, palpitations, or shortness of breath. Feeling well this AM and desires to go home.  OBJECTIVE Filed Vitals:   10/15/15 2150 10/16/15 0058 10/16/15 0257 10/16/15 0532  BP: 138/78 126/75 139/90 140/75  Pulse: 86 77 103 77  Temp: 98.3 F (36.8 C)  97.8 F (36.6 C) 97.2 F (36.2 C)  TempSrc: Oral  Oral Oral  Resp: 18  18   Height:      Weight:    172 lb 3.2 oz (78.109 kg)  SpO2: 95%  97% 97%    Intake/Output Summary (Last 24 hours) at 10/16/15 0921 Last data filed at 10/16/15 0630  Gross per 24 hour  Intake      0 ml  Output    550 ml  Net   -550 ml   Filed Weights   10/14/15 2048 10/15/15 0600 10/16/15 0532  Weight: 170 lb 10.2 oz (77.4 kg) 170 lb 10.2 oz (77.4 kg) 172 lb 3.2 oz (78.109 kg)    PHYSICAL EXAM General: Well developed, well nourished, male in no acute distress. Head: Normocephalic, atraumatic.  Neck: Supple without bruits, JVD not elevated. Lungs:  Resp regular and unlabored, CTA without wheezing or rales. Heart: Irregularly irregular, S1, S2, no S3, S4, or murmur; no rub. Abdomen: Soft, non-tender, non-distended with normoactive bowel sounds. No hepatomegaly. No rebound/guarding. No obvious abdominal masses. Extremities: No clubbing, cyanosis, or edema. Distal pedal pulses are 2+ bilaterally. Neuro: Alert and oriented X 3. Moves all extremities spontaneously. Psych: Normal affect.  LABS: CBC: Recent  Labs  10/14/15 1613 10/15/15 0214  WBC 5.6 5.1  HGB 14.0 12.7*  HCT 43.4 39.3  MCV 95.4 96.3  PLT 170 161   INR: Recent Labs  10/14/15 1613  INR 1.09   Basic Metabolic Panel: Recent Labs  10/14/15 1613 10/14/15 2100 10/15/15 0214  NA 142  --  141  K 4.2  --  3.8  CL 110  --  108  CO2 27  --  27  GLUCOSE 145*  --  159*  BUN 17  --  19  CREATININE 1.06  --  1.07  CALCIUM 8.7*  --  8.6*  MG  --  1.8  --   PHOS  --  3.0  --    Liver Function Tests: Recent Labs  10/14/15 2100  AST 22  ALT 17  ALKPHOS 58  BILITOT 1.1  PROT 6.5  ALBUMIN 4.0   Cardiac Enzymes: Recent Labs  10/14/15 2100 10/15/15 0214 10/15/15 0900  TROPONINI <0.03 <0.03 <0.03   BNP:  B NATRIURETIC PEPTIDE  Date/Time Value Ref Range Status  10/14/2015 04:13 PM 345.1* 0.0 - 100.0 pg/mL Final   Fasting Lipid Panel: Recent Labs  10/15/15 0214  CHOL 93  HDL 53  LDLCALC 35  TRIG 27  CHOLHDL 1.8   Thyroid Function Tests: Recent Labs  10/14/15 1613  TSH 1.487   TELE:  Atrial fibrillation  with rate in 60's -80's. Peaks into the 110's.    ECHO: 10/15/2015 Study Conclusions - Left ventricle: The cavity size was normal. Wall thickness was normal. Systolic function was normal. The estimated ejection fraction was in the range of 55% to 60%. Wall motion was normal; there were no regional wall motion abnormalities. - Right atrium: The atrium was severely dilated.  Radiology/Studies: Dg Chest Port 1 View: 10/14/2015  CLINICAL DATA:  Rapid and irregular heart rate.  Hypertension. EXAM: PORTABLE CHEST 1 VIEW COMPARISON:  None. FINDINGS: AP portable views of the chest were obtained. There is lucency in the retrocardiac space and suspect a large hiatal hernia. Subtle densities at both lung bases could represent pleural effusions with atelectasis or consolidation. Lung markings are slightly prominent without frank pulmonary edema. Heart size is upper limits of normal. IMPRESSION: Bibasilar  chest densities could represent a combination of atelectasis/consolidation with small effusions. Question a large hiatal hernia. Electronically Signed   By: Richarda OverlieAdam  Henn M.D.   On: 10/14/2015 16:44   Current Medications:  . aspirin EC  81 mg Oral Daily  . atorvastatin  40 mg Oral q1800  . diltiazem  60 mg Oral 4 times per day  . dorzolamide-timolol  1 drop Both Eyes BID  . doxazosin  4 mg Oral Daily  . finasteride  5 mg Oral Daily  . latanoprost  1 drop Both Eyes QHS  . losartan  25 mg Oral Daily  . multivitamin  1 tablet Oral Daily  . rivaroxaban  15 mg Oral Q supper  . sodium chloride  3 mL Intravenous Q12H      ASSESSMENT AND PLAN:  1. New Onset Atrial Fibrillation with RVR - presented in Atrial Fibrillation with RVR and rates in 140's - 150's. Was started on Cardizem drip and Heparin for anticoagulation. - This patients CHA2DS2-VASc Score and unadjusted Ischemic Stroke Rate (% per year) is equal to 3.2 % stroke rate/year from a score of 3 (HTN, Age > 75 x2).  - Echo showed EF of 55-60% with no evidence of valvular disease. - Rate has been in 60's - 80's mostly with peak rate in 110's. Continue Cardizem 60mg  PO Q6H with potential switch to Cardizem CD.  - Xarelto started. Pharmacy concerned with potential interaction of Cardizem and Xarelto. Recommend switching to Eliquis. Will check with insurance to see if they cover Eliquis. Is OK from cardiology perspective to be switched to Eliquis.   2. Hypertensive Crisis - BP initially in the 170's / 110's. - has been 126/75 - 147/96 in the past 24 hours. - continue Cozaar 25mg  daily.  3. HLD - Lipid Panel showing LDL of 35, HDL of 53, Triglycerides of 27 and Total Cholesterol of 93. - continue statin therapy.  4. Glaucoma - per admitting team  Once anticoagulation is confirmed, will likely need TEE/DCCV in 3 weeks which can be done on an outpatient basis. Stable for discharge from cardiology perspective.   Signed, Ellsworth LennoxBrittany M  Strader , PA-C 9:21 AM 10/16/2015 Pager: [phone]  Doing well BP down and rate controlled Eliquis for anticoagulation and LA cardizem for rate control Plan DCC in 3-4 weeks as Echo shows normal EF and no significant valve disease.    Charlton HawsPeter Nishan

## 2015-10-16 NOTE — Discharge Summary (Signed)
Physician Discharge Summary  Barry Burgess ZOX:[id number] DOB: 04-Oct-1926 DOA: 10/14/2015  PCP: Mickie Hillier, MD  Admit date: 10/14/2015 Discharge date: 10/16/2015  Recommendations for Outpatient Follow-up:  Continue apixaban and Cardizem on discharge for atrial fibrillation.  Discharge Diagnoses:  Principal Problem:   Accelerated hypertension Active Problems:   Atrial fibrillation with RVR (HCC)   HLD (hyperlipidemia)   BPH (benign prostatic hyperplasia)    Discharge Condition: stable   Diet recommendation: as tolerated   History of present illness:  81 y.o. male with past medical history of HTN, HLD, BPH who presented to [facility] ED with elevated blood pressure and he was found to be in atrial fibrillation with HR in 140-150 range. He was started on Cardizem IV and heparin drip for anticoagulation. Cardio has seen the pt in consultation. Transferred to telemetry 10/25.   Hospital Course:    Assessment/Plan:    Principal Problem:  Accelerated hypertension - BP currently controlled with Cardizem and losartan - Patient will continue Cardizem 240 mg daily and losartan 25 mg daily on discharge  Active Problems:  Atrial fibrillation with RVR (HCC) - CHADS vasc score 3 (HTN, age x 2) - On AC with heparin initially, then today xarelto but per cardiology will be changed to apixaban 5 mg BID 1st dose to be given in hospital and then to continue as prescribed on discharge - Rate controlled with Cardizem    HLD (hyperlipidemia) - Lipid Panel showed LDL of 35, HDL of 53, Triglycerides of 27 and Total Cholesterol of 93. - Continue statin therapy on discharge   BPH (benign prostatic hyperplasia) - Continue Proscar and Cardura on discharge   DVT Prophylaxis  - On xarelto but per cardio changed to apixaban today   Code Status: Full.  Family Communication: plan of care discussed with the patient   IV access:  Peripheral IV  Procedures and diagnostic  studies:   Dg Chest Port 1 View 10/14/2015 Bibasilar chest densities could represent a combination of atelectasis/consolidation with small effusions. Question a large hiatal hernia. Electronically Signed By: Richarda Overlie M.D. On: 10/14/2015 16:44    Medical Consultants:  Cardiology   Other Consultants:  None   IAnti-Infectives:   None    Signed:  Manson Passey, MD  [facility] 10/16/2015, 10:29 AM  Pager #: [phone]  Time spent in minutes: more than 30 minutes   Discharge Exam: Filed Vitals:   10/16/15 0955  BP: 136/72  Pulse: 76  Temp:   Resp:    Filed Vitals:   10/16/15 0058 10/16/15 0257 10/16/15 0532 10/16/15 0955  BP: 126/75 139/90 140/75 136/72  Pulse: 77 103 77 76  Temp:  97.8 F (36.6 C) 97.2 F (36.2 C)   TempSrc:  Oral Oral   Resp:  18    Height:      Weight:   78.109 kg (172 lb 3.2 oz)   SpO2:  97% 97%     General: Pt is alert, follows commands appropriately, not in acute distress Cardiovascular: Rate controlled, S1/S2 + Respiratory: Clear to auscultation bilaterally, no wheezing, no crackles, no rhonchi Abdominal: Soft, non tender, non distended, bowel sounds +, no guarding Extremities: no edema, no cyanosis, pulses palpable bilaterally DP and PT Neuro: Grossly nonfocal  Discharge Instructions  Discharge Instructions    Call MD for:  difficulty breathing, headache or visual disturbances    Complete by:  As directed      Call MD for:  persistant dizziness or light-headedness    Complete by:  As directed      Call MD for:  persistant nausea and vomiting    Complete by:  As directed      Call MD for:  severe uncontrolled pain    Complete by:  As directed      Diet - low sodium heart healthy    Complete by:  As directed      Discharge instructions    Complete by:  As directed   Continue apixaban and Cardizem on discharge for atrial fibrillation.     Increase activity slowly    Complete by:  As directed              Medication List    TAKE these medications        apixaban 5 MG Tabs tablet  Commonly known as:  ELIQUIS  Take 1 tablet (5 mg total) by mouth 2 (two) times daily.     aspirin EC 81 MG tablet  Take 81 mg by mouth daily.     diltiazem 240 MG 24 hr capsule  Commonly known as:  CARDIZEM CD  Take 1 capsule (240 mg total) by mouth daily.     dorzolamide-timolol 22.3-6.8 MG/ML ophthalmic solution  Commonly known as:  COSOPT  Place 1 drop into both eyes 2 (two) times daily.     doxazosin 4 MG tablet  Commonly known as:  CARDURA  Take 4 mg by mouth daily.     finasteride 5 MG tablet  Commonly known as:  PROSCAR  Take 5 mg by mouth daily.     ICAPS PO  Take 1 tablet by mouth daily.     latanoprost 0.005 % ophthalmic solution  Commonly known as:  XALATAN  Place 1 drop into both eyes every morning.     losartan 25 MG tablet  Commonly known as:  COZAAR  Take 1 tablet (25 mg total) by mouth daily.     simvastatin 80 MG tablet  Commonly known as:  ZOCOR  Take 40 mg by mouth every evening.           Follow-up Information    Follow up with Tereso NewcomerScott Weaver, PA-C On 10/30/2015.   Specialties:  Physician Assistant, Radiology, Interventional Cardiology   Why:  Cardiology Follow-Up on 10/30/2015 at 2:20PM.    Contact information:   [address] [place] [address] [phone]       Follow up with Mickie HillierLITTLE,KEVIN LORNE, MD. Schedule an appointment as soon as possible for a visit in 1 week.   Specialty:  Family Medicine   Why:  Follow up appt after recent hospitalization   Contact information:   [address] [place] [address] [phone]        The results of significant diagnostics from this hospitalization (including imaging, microbiology, ancillary and laboratory) are listed below for reference.    Significant Diagnostic Studies: Dg Chest Port 1 View  10/14/2015  CLINICAL DATA:  Rapid and irregular heart rate.  Hypertension. EXAM:  PORTABLE CHEST 1 VIEW COMPARISON:  None. FINDINGS: AP portable views of the chest were obtained. There is lucency in the retrocardiac space and suspect a large hiatal hernia. Subtle densities at both lung bases could represent pleural effusions with atelectasis or consolidation. Lung markings are slightly prominent without frank pulmonary edema. Heart size is upper limits of normal. IMPRESSION: Bibasilar chest densities could represent a combination of atelectasis/consolidation with small effusions. Question a large hiatal hernia. Electronically Signed   By: Richarda OverlieAdam  Henn  M.D.   On: 10/14/2015 16:44    Microbiology: Recent Results (from the past 240 hour(s))  MRSA PCR Screening     Status: None   Collection Time: 10/14/15  9:00 PM  Result Value Ref Range Status   MRSA by PCR NEGATIVE NEGATIVE Final    Comment:        The GeneXpert MRSA Assay (FDA approved for NASAL specimens only), is one component of a comprehensive MRSA colonization surveillance program. It is not intended to diagnose MRSA infection nor to guide or monitor treatment for MRSA infections.      Labs: Basic Metabolic Panel:  Recent Labs Lab 10/14/15 1613 10/14/15 2100 10/15/15 0214  NA 142  --  141  K 4.2  --  3.8  CL 110  --  108  CO2 27  --  27  GLUCOSE 145*  --  159*  BUN 17  --  19  CREATININE 1.06  --  1.07  CALCIUM 8.7*  --  8.6*  MG  --  1.8  --   PHOS  --  3.0  --    Liver Function Tests:  Recent Labs Lab 10/14/15 2100  AST 22  ALT 17  ALKPHOS 58  BILITOT 1.1  PROT 6.5  ALBUMIN 4.0   No results for input(s): LIPASE, AMYLASE in the last 168 hours. No results for input(s): AMMONIA in the last 168 hours. CBC:  Recent Labs Lab 10/14/15 1613 10/15/15 0214  WBC 5.6 5.1  HGB 14.0 12.7*  HCT 43.4 39.3  MCV 95.4 96.3  PLT 170 161   Cardiac Enzymes:  Recent Labs Lab 10/14/15 1613 10/14/15 2100 10/15/15 0214 10/15/15 0900  TROPONINI <0.03 <0.03 <0.03 <0.03   BNP: BNP (last 3  results)  Recent Labs  10/14/15 1613  BNP 345.1*    ProBNP (last 3 results) No results for input(s): PROBNP in the last 8760 hours.  CBG: No results for input(s): GLUCAP in the last 168 hours.

## 2015-10-16 NOTE — Progress Notes (Signed)
Patient with 2.02 second pause. Pt asymptomatic. VSS. NP on call notified. No new orders placed. Will continue to monitor closely

## 2015-10-16 NOTE — Progress Notes (Signed)
Patient with 4 beats of Vtach. Patient asymptomatic. VSS. NP on call notified. No new orders placed. Will continue to monitor closely

## 2015-10-16 NOTE — Discharge Instructions (Signed)
Atrial Fibrillation Atrial fibrillation is a type of heartbeat that is irregular or fast (rapid). If you have this condition, your heart keeps quivering in a weird (chaotic) way. This condition can make it so your heart cannot pump blood normally. Having this condition gives a person more risk for stroke, heart failure, and other heart problems. There are different types of atrial fibrillation. Talk with your doctor to learn about the type that you have. HOME CARE  Take over-the-counter and prescription medicines only as told by your doctor.  If your doctor prescribed a blood-thinning medicine, take it exactly as told. Taking too much of it can cause bleeding. If you do not take enough of it, you will not have the protection that you need against stroke and other problems.  Do not use any tobacco products. These include cigarettes, chewing tobacco, and e-cigarettes. If you need help quitting, ask your doctor.  If you have apnea (obstructive sleep apnea), manage it as told by your doctor.  Do not drink alcohol.  Do not drink beverages that have caffeine. These include coffee, soda, and tea.  Maintain a healthy weight. Do not use diet pills unless your doctor says they are safe for you. Diet pills may make heart problems worse.  Follow diet instructions as told by your doctor.  Exercise regularly as told by your doctor.  Keep all follow-up visits as told by your doctor. This is important. GET HELP IF:  You notice a change in the speed, rhythm, or strength of your heartbeat.  You are taking a blood-thinning medicine and you notice more bruising.  You get tired more easily when you move or exercise. GET HELP RIGHT AWAY IF:  You have pain in your chest or your belly (abdomen).  You have sweating or weakness.  You feel sick to your stomach (nauseous).  You notice blood in your throw up (vomit), poop (stool), or pee (urine).  You are short of breath.  You suddenly have swollen feet  and ankles.  You feel dizzy.  Your suddenly get weak or numb in your face, arms, or legs, especially if it happens on one side of your body.  You have trouble talking, trouble understanding, or both.  Your face or your eyelid droops on one side. These symptoms may be an emergency. Do not wait to see if the symptoms will go away. Get medical help right away. Call your local emergency services (911 in the [place] Do not drive yourself to the hospital.   This information is not intended to replace advice given to you by your health care provider. Make sure you discuss any questions you have with your health care provider.   Document Released: 09/15/2008 Document Revised: 08/28/2015 Document Reviewed: 04/03/2015 Elsevier Interactive Patient Education 2016 Elsevier Inc. Diltiazem extended-release capsules or tablets What is this medicine? DILTIAZEM (dil TYE a zem) is a calcium-channel blocker. It affects the amount of calcium found in your heart and muscle cells. This relaxes your blood vessels, which can reduce the amount of work the heart has to do. This medicine is used to treat high blood pressure and chest pain caused by angina. This medicine may be used for other purposes; ask your health care provider or pharmacist if you have questions. What should I tell my health care provider before I take this medicine? They need to know if you have any of these conditions: -heart problems, low blood pressure, irregular heartbeat -liver disease -previous heart attack -an unusual or allergic reaction  to diltiazem, other medicines, foods, dyes, or preservatives -pregnant or trying to get pregnant -breast-feeding How should I use this medicine? Take this medicine by mouth with a glass of water. Follow the directions on the prescription label. Swallow whole, do not crush or chew. Ask your doctor or pharmacist if your should take this medicine with food. Take your doses at regular intervals. Do not take  your medicine more often then directed. Do not stop taking except on the advice of your doctor or health care professional. Ask your doctor or health care professional how to gradually reduce the dose. Talk to your pediatrician regarding the use of this medicine in children. Special care may be needed. Overdosage: If you think you have taken too much of this medicine contact a poison control center or emergency room at once. NOTE: This medicine is only for you. Do not share this medicine with others. What if I miss a dose? If you miss a dose, take it as soon as you can. If it is almost time for your next dose, take only that dose. Do not take double or extra doses. What may interact with this medicine? Do not take this medicine with any of the following medications: -cisapride -hawthorn -pimozide -ranolazine -red yeast rice This medicine may also interact with the following medications: -buspirone -carbamazepine -cimetidine -cyclosporine -digoxin -local anesthetics or general anesthetics -lovastatin -medicines for anxiety or difficulty sleeping like midazolam and triazolam -medicines for high blood pressure or heart problems -quinidine -rifampin, rifabutin, or rifapentine This list may not describe all possible interactions. Give your health care provider a list of all the medicines, herbs, non-prescription drugs, or dietary supplements you use. Also tell them if you smoke, drink alcohol, or use illegal drugs. Some items may interact with your medicine. What should I watch for while using this medicine? Check your blood pressure and pulse rate regularly. Ask your doctor or health care professional what your blood pressure and pulse rate should be and when you should contact him or her. You may feel dizzy or lightheaded. Do not drive, use machinery, or do anything that needs mental alertness until you know how this medicine affects you. To reduce the risk of dizzy or fainting spells, do  not sit or stand up quickly, especially if you are an older patient. Alcohol can make you more dizzy or increase flushing and rapid heartbeats. Avoid alcoholic drinks. What side effects may I notice from receiving this medicine? Side effects that you should report to your doctor or health care professional as soon as possible: -allergic reactions like skin rash, itching or hives, swelling of the face, lips, or tongue -confusion, mental depression -feeling faint or lightheaded, falls -redness, blistering, peeling or loosening of the skin, including inside the mouth -slow, irregular heartbeat -swelling of the feet and ankles -unusual bleeding or bruising, pinpoint red spots on the skin Side effects that usually do not require medical attention (report to your doctor or health care professional if they continue or are bothersome): -constipation or diarrhea -difficulty sleeping -facial flushing -headache -nausea, vomiting -sexual dysfunction -weak or tired This list may not describe all possible side effects. Call your doctor for medical advice about side effects. You may report side effects to FDA at 1-800-FDA-1088. Where should I keep my medicine? Keep out of the reach of children. Store at room temperature between 15 and 30 degrees C (59 and 86 degrees F). Protect from humidity. Throw away any unused medicine after the expiration date. NOTE: This  sheet is a summary. It may not cover all possible information. If you have questions about this medicine, talk to your doctor, pharmacist, or health care provider.    2016, Elsevier/Gold Standard. (2008-03-29 14:35:47) Apixaban oral tablets What is this medicine? APIXABAN (a PIX a ban) is an anticoagulant (blood thinner). It is used to lower the chance of stroke in people with a medical condition called atrial fibrillation. It is also used to treat or prevent blood clots in the lungs or in the veins. This medicine may be used for other purposes;  ask your health care provider or pharmacist if you have questions. What should I tell my health care provider before I take this medicine? They need to know if you have any of these conditions: -bleeding disorders -bleeding in the brain -blood in your stools (black or tarry stools) or if you have blood in your vomit -history of stomach bleeding -kidney disease -liver disease -mechanical heart valve -an unusual or allergic reaction to apixaban, other medicines, foods, dyes, or preservatives -pregnant or trying to get pregnant -breast-feeding How should I use this medicine? Take this medicine by mouth with a glass of water. Follow the directions on the prescription label. You can take it with or without food. If it upsets your stomach, take it with food. Take your medicine at regular intervals. Do not take it more often than directed. Do not stop taking except on your doctor's advice. Stopping this medicine may increase your risk of a blot clot. Be sure to refill your prescription before you run out of medicine. Talk to your pediatrician regarding the use of this medicine in children. Special care may be needed. Overdosage: If you think you have taken too much of this medicine contact a poison control center or emergency room at once. NOTE: This medicine is only for you. Do not share this medicine with others. What if I miss a dose? If you miss a dose, take it as soon as you can. If it is almost time for your next dose, take only that dose. Do not take double or extra doses. What may interact with this medicine? This medicine may interact with the following: -aspirin and aspirin-like medicines -certain medicines for fungal infections like ketoconazole and itraconazole -certain medicines for seizures like carbamazepine and phenytoin -certain medicines that treat or prevent blood clots like warfarin, enoxaparin, and dalteparin -clarithromycin -NSAIDs, medicines for pain and inflammation, like  ibuprofen or naproxen -rifampin -ritonavir -St. John's wort This list may not describe all possible interactions. Give your health care provider a list of all the medicines, herbs, non-prescription drugs, or dietary supplements you use. Also tell them if you smoke, drink alcohol, or use illegal drugs. Some items may interact with your medicine. What should I watch for while using this medicine? Notify your doctor or health care professional and seek emergency treatment if you develop breathing problems; changes in vision; chest pain; severe, sudden headache; pain, swelling, warmth in the leg; trouble speaking; sudden numbness or weakness of the face, arm, or leg. These can be signs that your condition has gotten worse. If you are going to have surgery, tell your doctor or health care professional that you are taking this medicine. Tell your health care professional that you use this medicine before you have a spinal or epidural procedure. Sometimes people who take this medicine have bleeding problems around the spine when they have a spinal or epidural procedure. This bleeding is very rare. If you have a spinal  or epidural procedure while on this medicine, call your health care professional immediately if you have back pain, numbness or tingling (especially in your legs and feet), muscle weakness, paralysis, or loss of bladder or bowel control. Avoid sports and activities that might cause injury while you are using this medicine. Severe falls or injuries can cause unseen bleeding. Be careful when using sharp tools or knives. Consider using an Neurosurgeon. Take special care brushing or flossing your teeth. Report any injuries, bruising, or red spots on the skin to your doctor or health care professional. What side effects may I notice from receiving this medicine? Side effects that you should report to your doctor or health care professional as soon as possible: -allergic reactions like skin rash,  itching or hives, swelling of the face, lips, or tongue -signs and symptoms of bleeding such as bloody or black, tarry stools; red or dark-brown urine; spitting up blood or brown material that looks like coffee grounds; red spots on the skin; unusual bruising or bleeding from the eye, gums, or nose This list may not describe all possible side effects. Call your doctor for medical advice about side effects. You may report side effects to FDA at 1-800-FDA-1088. Where should I keep my medicine? Keep out of the reach of children. Store at room temperature between 20 and 25 degrees C (68 and 77 degrees F). Throw away any unused medicine after the expiration date. NOTE: This sheet is a summary. It may not cover all possible information. If you have questions about this medicine, talk to your doctor, pharmacist, or health care provider.    2016, Elsevier/Gold Standard. (2013-08-11 11:59:24)

## 2015-10-29 NOTE — H&P (Signed)
Cardiology Office Note   Date:  10/30/2015   ID:  Barry NunneryCharles W Burgess, DOB 05/16/1926, MRN [id number]   Patient Care Team: Catha GosselinKevin Little, MD as PCP - General (Family Medicine) Wendall StadePeter C Nishan, MD as Consulting Physician (Cardiology)    Chief Complaint  Patient presents with  . Hospitalization Follow-up  . Atrial Fibrillation     History of Present Illness: Barry Burgess is a 81 y.o. male with a hx of HTN, HL, BPH, Glaucoma.  Admitted 10/24-10/26 with AF with RVR in the setting of accelerated HTN.  CHADS2-VASc=3.  Apixaban was initiated for anticoagulation.  Rate was controlled on calcium channel blocker. Plan was to pursue DCCV after 3-4 weeks of uninterrupted anticoagulation.    Returns for FU.  Here alone. Doing well since DC. Denies chest pain, dyspnea, syncope, PND.  He has chronic LE edema.  Denies any bleeding issues.  He likes to exercise and play golf.  He is eager to get back to his usual routine.  He was fatigued after DC but this is improved.     Studies/Reports Reviewed Today:  Echo 10/15/15 EF 55-60%, no RWMA, severe RAE   Past Medical History  Diagnosis Date  . Hypertension   . Hyperlipidemia   . BPH (benign prostatic hyperplasia)   . Glaucoma     Past Surgical History  Procedure Laterality Date  . Pilonidal cyst / sinus excision    . Eye surgery      bilateral cataracts     Current Outpatient Prescriptions  Medication Sig Dispense Refill  . apixaban (ELIQUIS) 5 MG TABS tablet Take 1 tablet (5 mg total) by mouth 2 (two) times daily. 60 tablet 0  . aspirin EC 81 MG tablet Take 81 mg by mouth daily.    Marland Kitchen. diltiazem (CARDIZEM CD) 240 MG 24 hr capsule Take 1 capsule (240 mg total) by mouth daily. 30 capsule 0  . dorzolamide-timolol (COSOPT) 22.3-6.8 MG/ML ophthalmic solution Place 1 drop into both eyes 2 (two) times daily.  6  . doxazosin (CARDURA) 4 MG tablet Take 4 mg by mouth daily.  2  . finasteride (PROSCAR) 5 MG tablet Take 5 mg by mouth daily.  2   . latanoprost (XALATAN) 0.005 % ophthalmic solution Place 1 drop into both eyes every morning.  8  . losartan (COZAAR) 25 MG tablet Take 1 tablet (25 mg total) by mouth daily. 30 tablet 0  . Multiple Vitamins-Minerals (ICAPS PO) Take 1 tablet by mouth daily.    . simvastatin (ZOCOR) 80 MG tablet Take 40 mg by mouth every evening.  1   No current facility-administered medications for this visit.    Allergies:   Review of patient's allergies indicates no known allergies.    Social History:   Social History   Social History  . Marital Status: Married    Spouse Name: N/A  . Number of Children: N/A  . Years of Education: N/A   Social History Main Topics  . Smoking status: Former Smoker    Types: Pipe, Cigars    Quit date: 10/13/1962  . Smokeless tobacco: Never Used  . Alcohol Use: Yes     Comment: 1 glass wine per night  . Drug Use: No  . Sexual Activity: Not Asked   Other Topics Concern  . None   Social History Narrative     Family History:   Family History  Problem Relation Age of Onset  . Stroke Father 92      ROS:  Please see the history of present illness.   Review of Systems  All other systems reviewed and are negative.     PHYSICAL EXAM: VS:  BP 144/80 mmHg  Pulse 106  Ht 6' (1.829 m)  Wt 171 lb 6.4 oz (77.747 kg)  BMI 23.24 kg/m2    Wt Readings from Last 3 Encounters:  10/30/15 171 lb 6.4 oz (77.747 kg)  10/16/15 172 lb 3.2 oz (78.109 kg)     GEN: Well nourished, well developed, in no acute distress HEENT: normal Neck: no JVD, no masses Cardiac:  Normal S1/S2, irreg irreg rhythm; no murmur ,  no rubs or gallops, 1-2+ ankle edema   Respiratory:  clear to auscultation bilaterally, no wheezing, rhonchi or rales. GI: soft, nontender, nondistended, + BS MS: no deformity or atrophy Skin: warm and dry  Neuro:  CNs II-XII intact, Strength and sensation are intact Psych: Normal affect   EKG:  EKG is ordered today.  It demonstrates:   NSR, HR  106, rightward axis, QTc 438 ms, no change from prior tracing.    Recent Labs: 10/14/2015: ALT 17; B Natriuretic Peptide 345.1*; Magnesium 1.8; TSH 1.487 10/15/2015: BUN 19; Creatinine, Ser 1.07; Hemoglobin 12.7*; Platelets 161; Potassium 3.8; Sodium 141    Lipid Panel    Component Value Date/Time   CHOL 93 10/15/2015 0214   TRIG 27 10/15/2015 0214   HDL 53 10/15/2015 0214   CHOLHDL 1.8 10/15/2015 0214   VLDL 5 10/15/2015 0214   LDLCALC 35 10/15/2015 0214      ASSESSMENT AND PLAN:  1. Persistent Atrial Fibrillation:   He remains in AF.  I reviewed the risks and benefits of proceeding with DCCV to restore NSR.  We discussed the importance of 3 weeks of uninterrupted anticoagulation prior to proceeding with DCCV.  He understands all of this.  Continue Diltiazem, Eliquis.  He can DC ASA as he is now on Eliquis.  I will schedule him for DCCV the week of 11/21 as this will be > 3 weeks since his first dose of Eliquis.    2. HTN:  Fair control.  Continue current Rx.      Medication Changes: Current medicines are reviewed at length with the patient today.  Concerns regarding medicines are as outlined above.  The following changes have been made:   Discontinued Medications   No medications on file   Modified Medications   No medications on file   New Prescriptions   No medications on file   Labs/ tests ordered today include:   Orders Placed This Encounter  Procedures  . Basic Metabolic Panel (BMET)  . EKG 12-Lead  . PR OFFICE OUTPATIENT VISIT 5 MINUTES     Disposition:    FU with Dr. Charlton Haws or me 1-2 weeks post DCCV.     Signed, Brynda Rim, MHS 10/30/2015 5:00 PM    [facility] [address] [place], [place], [place]  [address] Phone: [phone]; Fax: [phone]

## 2015-10-30 ENCOUNTER — Encounter: Payer: Self-pay | Admitting: Physician Assistant

## 2015-10-30 ENCOUNTER — Encounter: Payer: Self-pay | Admitting: *Deleted

## 2015-10-30 ENCOUNTER — Ambulatory Visit (INDEPENDENT_AMBULATORY_CARE_PROVIDER_SITE_OTHER): Payer: Medicare Other | Admitting: Physician Assistant

## 2015-10-30 VITALS — BP 144/80 | HR 106 | Ht 72.0 in | Wt 171.4 lb

## 2015-10-30 DIAGNOSIS — I481 Persistent atrial fibrillation: Secondary | ICD-10-CM

## 2015-10-30 DIAGNOSIS — I10 Essential (primary) hypertension: Secondary | ICD-10-CM

## 2015-10-30 DIAGNOSIS — I4819 Other persistent atrial fibrillation: Secondary | ICD-10-CM

## 2015-10-30 NOTE — Patient Instructions (Signed)
Medication Instructions:  Your physician recommends that you continue on your current medications as directed. Please refer to the Current Medication list given to you today.   Labwork: 11/12/15 BMET  Testing/Procedures: Your physician has recommended that you have a Cardioversion (DCCV). Electrical Cardioversion uses a jolt of electricity to your heart either through paddles or wired patches attached to your chest. This is a controlled, usually prescheduled, procedure. Defibrillation is done under light anesthesia in the hospital, and you usually go home the day of the procedure. This is done to get your heart back into a normal rhythm. You are not awake for the procedure. Please see the instruction sheet given to you today.    Follow-Up: 1. DR. Eden EmmsNISHAN 1-2 WEEKS S/P CARDIOVERSION  2. YOU WILL NEED TO COME IN 11/12/15 FOR NURSE VISIT FOR AN EKG TO BE DONE THE DAY BEFORE CARDIOVERSION  Any Other Special Instructions Will Be Listed Below (If Applicable).   If you need a refill on your cardiac medications before your next appointment, please call your pharmacy.

## 2015-11-12 ENCOUNTER — Other Ambulatory Visit (INDEPENDENT_AMBULATORY_CARE_PROVIDER_SITE_OTHER): Payer: Medicare Other | Admitting: *Deleted

## 2015-11-12 ENCOUNTER — Ambulatory Visit (INDEPENDENT_AMBULATORY_CARE_PROVIDER_SITE_OTHER): Payer: Medicare Other | Admitting: Physician Assistant

## 2015-11-12 DIAGNOSIS — I481 Persistent atrial fibrillation: Secondary | ICD-10-CM

## 2015-11-12 DIAGNOSIS — I10 Essential (primary) hypertension: Secondary | ICD-10-CM

## 2015-11-12 DIAGNOSIS — I4819 Other persistent atrial fibrillation: Secondary | ICD-10-CM

## 2015-11-12 DIAGNOSIS — Z0189 Encounter for other specified special examinations: Secondary | ICD-10-CM

## 2015-11-12 LAB — BASIC METABOLIC PANEL
BUN: 18 mg/dL (ref 7–25)
CALCIUM: 8.9 mg/dL (ref 8.6–10.3)
CHLORIDE: 107 mmol/L (ref 98–110)
CO2: 25 mmol/L (ref 20–31)
Creat: 0.95 mg/dL (ref 0.70–1.11)
GLUCOSE: 101 mg/dL — AB (ref 65–99)
Potassium: 4.1 mmol/L (ref 3.5–5.3)
SODIUM: 142 mmol/L (ref 135–146)

## 2015-11-12 NOTE — Progress Notes (Signed)
PT  HAD   EKG  DONE TODAY  FOR  PRE  CARDIOVERSION. CARDIOVERSION  SCHEDULED FOR  TOMORROW    PER  PT  HAD  QUESTIONS  AND  WAS  WANTING TO  CANCELL PROCEDURE AND  GIVE MED  LONGER TIME  TO  WORK  ATTEMPTED  TO   ENCOURAGE PT  TO  HAVE PROCEDURE DONE  INSISTED   ON SPEAKING TO SCOTT WEAVER  PAC.SCOTT  SPOKE WITH PT  AND  AND AFTER  MUCH ENCOURAGEMENT  PT  AGREES TO PROCEED .Zack Seal/CY

## 2015-11-12 NOTE — Progress Notes (Signed)
Reviewed DCCV with patient. He feels fine. No complaints.  Tells me HR at home in the 80s. HR up to 140 in ECG today. He states he is nervous. I have recommended he proceed with DCCV tomorrow as planned. He agrees to proceed. Harue Pribble, PA-C   11/12/2015 5:46 PM   

## 2015-11-13 ENCOUNTER — Encounter (HOSPITAL_COMMUNITY)
Admission: RE | Disposition: A | Discharge status: Home / Self Care | Payer: Self-pay | Source: Ambulatory Visit | Attending: Cardiovascular Disease

## 2015-11-13 ENCOUNTER — Ambulatory Visit (HOSPITAL_COMMUNITY): Payer: Medicare Other | Admitting: Anesthesiology

## 2015-11-13 ENCOUNTER — Other Ambulatory Visit: Payer: Self-pay | Admitting: Cardiovascular Disease

## 2015-11-13 ENCOUNTER — Other Ambulatory Visit: Payer: Self-pay | Admitting: *Deleted

## 2015-11-13 ENCOUNTER — Encounter (HOSPITAL_COMMUNITY): Payer: Self-pay | Admitting: Anesthesiology

## 2015-11-13 ENCOUNTER — Ambulatory Visit (HOSPITAL_COMMUNITY)
Admission: RE | Admit: 2015-11-13 | Discharge: 2015-11-13 | Disposition: A | Discharge status: Home / Self Care | Payer: Medicare Other | Source: Ambulatory Visit | Attending: Cardiovascular Disease | Admitting: Cardiovascular Disease

## 2015-11-13 DIAGNOSIS — Z7982 Long term (current) use of aspirin: Secondary | ICD-10-CM | POA: Diagnosis not present

## 2015-11-13 DIAGNOSIS — I10 Essential (primary) hypertension: Secondary | ICD-10-CM | POA: Insufficient documentation

## 2015-11-13 DIAGNOSIS — I4891 Unspecified atrial fibrillation: Secondary | ICD-10-CM | POA: Insufficient documentation

## 2015-11-13 DIAGNOSIS — Z7901 Long term (current) use of anticoagulants: Secondary | ICD-10-CM | POA: Diagnosis not present

## 2015-11-13 DIAGNOSIS — E785 Hyperlipidemia, unspecified: Secondary | ICD-10-CM | POA: Diagnosis not present

## 2015-11-13 DIAGNOSIS — Z87891 Personal history of nicotine dependence: Secondary | ICD-10-CM | POA: Insufficient documentation

## 2015-11-13 DIAGNOSIS — I4819 Other persistent atrial fibrillation: Secondary | ICD-10-CM

## 2015-11-13 DIAGNOSIS — Z79899 Other long term (current) drug therapy: Secondary | ICD-10-CM | POA: Insufficient documentation

## 2015-11-13 HISTORY — PX: CARDIOVERSION: SHX1299

## 2015-11-13 SURGERY — CARDIOVERSION
Anesthesia: General

## 2015-11-13 MED ORDER — PROPOFOL 10 MG/ML IV BOLUS
INTRAVENOUS | Status: DC | PRN
  Administered 2015-11-13: 60 mg via INTRAVENOUS

## 2015-11-13 MED ORDER — METOPROLOL TARTRATE 1 MG/ML IV SOLN
INTRAVENOUS | Status: AC
  Filled 2015-11-13: qty 5

## 2015-11-13 MED ORDER — LIDOCAINE HCL (CARDIAC) 20 MG/ML IV SOLN
INTRAVENOUS | Status: DC | PRN
  Administered 2015-11-13: 40 mg via INTRAVENOUS

## 2015-11-13 MED ORDER — DILTIAZEM HCL ER COATED BEADS 240 MG PO CP24: 240.0000 mg | ORAL_CAPSULE | Freq: Every day | ORAL | Status: DC

## 2015-11-13 MED ORDER — SODIUM CHLORIDE 0.9 % IJ SOLN: 3.0000 mL | INTRAMUSCULAR | Status: DC | PRN

## 2015-11-13 MED ORDER — SODIUM CHLORIDE 0.9 % IV SOLN
INTRAVENOUS | Status: DC | PRN
  Administered 2015-11-13: 10:00:00 via INTRAVENOUS

## 2015-11-13 MED ORDER — SODIUM CHLORIDE 0.9 % IJ SOLN: 3.0000 mL | Freq: Two times a day (BID) | INTRAMUSCULAR | Status: DC

## 2015-11-13 MED ORDER — HYDROCORTISONE 1 % EX CREA
1.0000 "application " | TOPICAL_CREAM | Freq: Three times a day (TID) | CUTANEOUS | Status: DC | PRN
  Filled 2015-11-13: qty 28

## 2015-11-13 MED ORDER — LOSARTAN POTASSIUM 25 MG PO TABS: 25.0000 mg | ORAL_TABLET | Freq: Every day | ORAL | Status: DC

## 2015-11-13 MED ORDER — SODIUM CHLORIDE 0.9 % IV SOLN: 250.0000 mL | INTRAVENOUS | Status: DC

## 2015-11-13 NOTE — Anesthesia Postprocedure Evaluation (Signed)
Anesthesia Post Note  Patient: Barry NunneryCharles W Eiben  Procedure(s) Performed: Procedure(s) (LRB): CARDIOVERSION (N/A)  Patient location during evaluation: PACU Anesthesia Type: General Level of consciousness: sedated and patient cooperative Pain management: pain level controlled Vital Signs Assessment: post-procedure vital signs reviewed and stable Respiratory status: spontaneous breathing Cardiovascular status: stable Anesthetic complications: no    Last Vitals:  Filed Vitals:   11/13/15 1030 11/13/15 1040  BP: 128/70 151/86  Pulse: 75 70  Temp:    Resp: 12 19    Last Pain: There were no vitals filed for this visit.               Lewie LoronJohn Ofilia Rayon

## 2015-11-13 NOTE — Transfer of Care (Signed)
Immediate Anesthesia Transfer of Care Note  Patient: Barry NunneryCharles W Burgess  Procedure(s) Performed: Procedure(s): CARDIOVERSION (N/A)  Patient Location: Endoscopy Unit  Anesthesia Type:MAC  Level of Consciousness: awake, alert  and patient cooperative  Airway & Oxygen Therapy: Patient Spontanous Breathing and Patient connected to nasal cannula oxygen  Post-op Assessment: Report given to RN, Post -op Vital signs reviewed and stable and Patient moving all extremities  Post vital signs: Reviewed and stable  Last Vitals:  Filed Vitals:   11/13/15 0956 11/13/15 1024  BP: 165/92 125/77  Pulse:  72  Temp:  36.6 C  Resp: 20 16    Complications: No apparent anesthesia complications

## 2015-11-13 NOTE — CV Procedure (Signed)
DCC: 70 mg propofol  60 mg Lidocaine  ON Rx Eliquis over 3 weeks NPO  Single 120 J bipasic shock delivered with conversion from afib rate 118 to NSR rate 73 Some PAC;s NSVT and elevated BP post conversion given iv lopressor 5 mg  No immediate neurologic sequelae  Charlton HawsPeter Teyon Odette

## 2015-11-13 NOTE — Anesthesia Postprocedure Evaluation (Signed)
Anesthesia Post Note  Patient: Barry Burgess  Procedure(s) Performed: Procedure(s) (LRB): CARDIOVERSION (N/A)  Anesthesia Post Evaluation  Last Vitals:  Filed Vitals:   11/13/15 0956 11/13/15 1024  BP: 165/92 125/77  Pulse:  72  Temp:  36.6 C  Resp: 20 16    Last Pain: There were no vitals filed for this visit.               Eldra Word

## 2015-11-13 NOTE — H&P (View-Only) (Signed)
Reviewed DCCV with patient. He feels fine. No complaints.  Tells me HR at home in the 80s. HR up to 140 in ECG today. He states he is nervous. I have recommended he proceed with DCCV tomorrow as planned. He agrees to proceed. Tereso NewcomerScott Tahji Boyd, PA-C   11/12/2015 5:46 PM

## 2015-11-13 NOTE — Anesthesia Preprocedure Evaluation (Addendum)
Anesthesia Evaluation  Patient identified by MRN, date of birth, ID band Patient awake    Reviewed: Allergy & Precautions, NPO status , Patient's Chart, lab work & pertinent test results  Airway Mallampati: II  TM Distance: >3 FB Neck ROM: Full    Dental no notable dental hx.    Pulmonary former smoker,    Pulmonary exam normal breath sounds clear to auscultation       Cardiovascular hypertension, Pt. on medications negative cardio ROS Normal cardiovascular exam Rhythm:Regular Rate:Normal     Neuro/Psych negative neurological ROS  negative psych ROS   GI/Hepatic negative GI ROS, Neg liver ROS,   Endo/Other  negative endocrine ROS  Renal/GU negative Renal ROS     Musculoskeletal negative musculoskeletal ROS (+)   Abdominal   Peds  Hematology negative hematology ROS (+)   Anesthesia Other Findings   Reproductive/Obstetrics                             Anesthesia Physical Anesthesia Plan  ASA: II  Anesthesia Plan: General   Post-op Pain Management:    Induction: Intravenous  Airway Management Planned: Mask  Additional Equipment:   Intra-op Plan:   Post-operative Plan:   Informed Consent: I have reviewed the patients History and Physical, chart, labs and discussed the procedure including the risks, benefits and alternatives for the proposed anesthesia with the patient or authorized representative who has indicated his/her understanding and acceptance.   Dental advisory given  Plan Discussed with: CRNA  Anesthesia Plan Comments:         Anesthesia Quick Evaluation

## 2015-11-13 NOTE — Discharge Instructions (Signed)
Electrical Cardioversion, Care After °Refer to this sheet in the next few weeks. These instructions provide you with information on caring for yourself after your procedure. Your health care provider may also give you more specific instructions. Your treatment has been planned according to current medical practices, but problems sometimes occur. Call your health care provider if you have any problems or questions after your procedure. °WHAT TO EXPECT AFTER THE PROCEDURE °After your procedure, it is typical to have the following sensations: °· Some redness on the skin where the shocks were delivered. If this is tender, a sunburn lotion or hydrocortisone cream may help. °· Possible return of an abnormal heart rhythm within hours or days after the procedure. °HOME CARE INSTRUCTIONS °· Take medicines only as directed by your health care provider. Be sure you understand how and when to take your medicine. °· Learn how to feel your pulse and check it often. °· Limit your activity for 48 hours after the procedure or as directed by your health care provider. °· Avoid or minimize caffeine and other stimulants as directed by your health care provider. °SEEK MEDICAL CARE IF: °· You feel like your heart is beating too fast or your pulse is not regular. °· You have any questions about your medicines. °· You have bleeding that will not stop. °SEEK IMMEDIATE MEDICAL CARE IF: °· You are dizzy or feel faint. °· It is hard to breathe or you feel short of breath. °· There is a change in discomfort in your chest. °· Your speech is slurred or you have trouble moving an arm or leg on one side of your body. °· You get a serious muscle cramp that does not go away. °· Your fingers or toes turn cold or blue. °  °This information is not intended to replace advice given to you by your health care provider. Make sure you discuss any questions you have with your health care provider. °  °Document Released: 09/27/2013 Document Revised: 12/28/2014  Document Reviewed: 09/27/2013 °Elsevier Interactive Patient Education ©2016 Elsevier Inc. ° °

## 2015-11-13 NOTE — Interval H&P Note (Signed)
History and Physical Interval Note:  11/13/2015 9:48 AM  Barry Burgess  has presented today for surgery, with the diagnosis of AFIB  The various methods of treatment have been discussed with the patient and family. After consideration of risks, benefits and other options for treatment, the patient has consented to  Procedure(s): CARDIOVERSION (N/A) as a surgical intervention .  The patient's history has been reviewed, patient examined, no change in status, stable for surgery.  I have reviewed the patient's chart and labs.  Questions were answered to the patient's satisfaction.     Charlton HawsPeter Dovey Fatzinger

## 2015-11-15 ENCOUNTER — Encounter (HOSPITAL_COMMUNITY): Payer: Self-pay | Admitting: Cardiovascular Disease

## 2015-11-18 ENCOUNTER — Other Ambulatory Visit: Payer: Self-pay

## 2015-11-18 ENCOUNTER — Telehealth: Payer: Self-pay | Admitting: *Deleted

## 2015-11-18 MED ORDER — DILTIAZEM HCL ER COATED BEADS 240 MG PO CP24: 240.0000 mg | ORAL_CAPSULE | Freq: Every day | ORAL | Status: DC

## 2015-11-18 MED ORDER — LOSARTAN POTASSIUM 25 MG PO TABS: 25.0000 mg | ORAL_TABLET | Freq: Every day | ORAL | Status: DC

## 2015-11-18 NOTE — Telephone Encounter (Signed)
New message ° ° ° ° °Returning a call to the nurse °

## 2015-11-18 NOTE — Telephone Encounter (Signed)
Pt notified of lab results by phone with verbal understanding. Pt asked for me to please pass on to Tereso NewcomerScott Weaver, [facility] how much he appreciates everything he has done for him and to let Lorin PicketScott know that he is feels better than he has in a long time. I thanked the pt for his kind words and most certainly will pass his message on to Hudson BendScott W. PA.

## 2015-11-21 NOTE — Progress Notes (Signed)
SEEING  MICHELE  LENZE PAC  ON 12-02-15 ./CY

## 2015-12-02 ENCOUNTER — Encounter: Payer: Self-pay | Admitting: Physician Assistant

## 2015-12-02 ENCOUNTER — Ambulatory Visit (INDEPENDENT_AMBULATORY_CARE_PROVIDER_SITE_OTHER): Payer: Medicare Other | Admitting: Physician Assistant

## 2015-12-02 VITALS — BP 140/90 | HR 75 | Ht 72.0 in | Wt 165.0 lb

## 2015-12-02 DIAGNOSIS — I4891 Unspecified atrial fibrillation: Secondary | ICD-10-CM | POA: Diagnosis not present

## 2015-12-02 DIAGNOSIS — I10 Essential (primary) hypertension: Secondary | ICD-10-CM

## 2015-12-02 DIAGNOSIS — I481 Persistent atrial fibrillation: Secondary | ICD-10-CM | POA: Diagnosis not present

## 2015-12-02 DIAGNOSIS — I4819 Other persistent atrial fibrillation: Secondary | ICD-10-CM

## 2015-12-02 NOTE — Patient Instructions (Signed)
Medication Instructions:  Your physician recommends that you continue on your current medications as directed. Please refer to the Current Medication list given to you today.   Labwork: None ordered  Testing/Procedures: None ordered  Follow-Up: Your physician recommends that you schedule a follow-up appointment in: 2 MONTHS WITH DR. Eden Emms   Any Other Special Instructions Will Be Listed Below (If Applicable).  Low-Sodium Eating Plan Sodium raises blood pressure and causes water to be held in the body. Getting less sodium from food will help lower your blood pressure, reduce any swelling, and protect your heart, liver, and kidneys. We get sodium by adding salt (sodium chloride) to food. Most of our sodium comes from canned, boxed, and frozen foods. Restaurant foods, fast foods, and pizza are also very high in sodium. Even if you take medicine to lower your blood pressure or to reduce fluid in your body, getting less sodium from your food is important. WHAT IS MY PLAN? Most people should limit their sodium intake to 2,300 mg a day. Your health care provider recommends that you limit your sodium intake to 2 GRAMS a day.  WHAT DO I NEED TO KNOW ABOUT THIS EATING PLAN? For the low-sodium eating plan, you will follow these general guidelines:  Choose foods with a % Daily Value for sodium of less than 5% (as listed on the food label).   Use salt-free seasonings or herbs instead of table salt or sea salt.   Check with your health care provider or pharmacist before using salt substitutes.   Eat fresh foods.  Eat more vegetables and fruits.  Limit canned vegetables. If you do use them, rinse them well to decrease the sodium.   Limit cheese to 1 oz (28 g) per day.   Eat lower-sodium products, often labeled as "lower sodium" or "no salt added."  Avoid foods that contain monosodium glutamate (MSG). MSG is sometimes added to [place] food and some canned foods.  Check food labels  (Nutrition Facts labels) on foods to learn how much sodium is in one serving.  Eat more home-cooked food and less restaurant, buffet, and fast food.  When eating at a restaurant, ask that your food be prepared with less salt, or no salt if possible.  HOW DO I READ FOOD LABELS FOR SODIUM INFORMATION? The Nutrition Facts label lists the amount of sodium in one serving of the food. If you eat more than one serving, you must multiply the listed amount of sodium by the number of servings. Food labels may also identify foods as:  Sodium free--Less than 5 mg in a serving.  Very low sodium--35 mg or less in a serving.  Low sodium--140 mg or less in a serving.  Light in sodium--50% less sodium in a serving. For example, if a food that usually has 300 mg of sodium is changed to become light in sodium, it will have 150 mg of sodium.  Reduced sodium--25% less sodium in a serving. For example, if a food that usually has 400 mg of sodium is changed to reduced sodium, it will have 300 mg of sodium. WHAT FOODS CAN I EAT? Grains Low-sodium cereals, including oats, puffed wheat and rice, and shredded wheat cereals. Low-sodium crackers. Unsalted rice and pasta. Lower-sodium bread.  Vegetables Frozen or fresh vegetables. Low-sodium or reduced-sodium canned vegetables. Low-sodium or reduced-sodium tomato sauce and paste. Low-sodium or reduced-sodium tomato and vegetable juices.  Fruits Fresh, frozen, and canned fruit. Fruit juice.  Meat and Other Protein Products Low-sodium canned tuna  and salmon. Fresh or frozen meat, poultry, seafood, and fish. Lamb. Unsalted nuts. Dried beans, peas, and lentils without added salt. Unsalted canned beans. Homemade soups without salt. Eggs.  Dairy Milk. Soy milk. Ricotta cheese. Low-sodium or reduced-sodium cheeses. Yogurt.  Condiments Fresh and dried herbs and spices. Salt-free seasonings. Onion and garlic powders. Low-sodium varieties of mustard and ketchup. Fresh  or refrigerated horseradish. Lemon juice.  Fats and Oils Reduced-sodium salad dressings. Unsalted butter.  Other Unsalted popcorn and pretzels.  The items listed above may not be a complete list of recommended foods or beverages. Contact your dietitian for more options. WHAT FOODS ARE NOT RECOMMENDED? Grains Instant hot cereals. Bread stuffing, pancake, and biscuit mixes. Croutons. Seasoned rice or pasta mixes. Noodle soup cups. Boxed or frozen macaroni and cheese. Self-rising flour. Regular salted crackers. Vegetables Regular canned vegetables. Regular canned tomato sauce and paste. Regular tomato and vegetable juices. Frozen vegetables in sauces. Salted JamaicaFrench fries. Olives. Rosita FirePickles. Relishes. Sauerkraut. Salsa. Meat and Other Protein Products Salted, canned, smoked, spiced, or pickled meats, seafood, or fish. Bacon, ham, sausage, hot dogs, corned beef, chipped beef, and packaged luncheon meats. Salt pork. Jerky. Pickled herring. Anchovies, regular canned tuna, and sardines. Salted nuts. Dairy Processed cheese and cheese spreads. Cheese curds. Blue cheese and cottage cheese. Buttermilk.  Condiments Onion and garlic salt, seasoned salt, table salt, and sea salt. Canned and packaged gravies. Worcestershire sauce. Tartar sauce. Barbecue sauce. Teriyaki sauce. Soy sauce, including reduced sodium. Steak sauce. Fish sauce. Oyster sauce. Cocktail sauce. Horseradish that you find on the shelf. Regular ketchup and mustard. Meat flavorings and tenderizers. Bouillon cubes. Hot sauce. Tabasco sauce. Marinades. Taco seasonings. Relishes. Fats and Oils Regular salad dressings. Salted butter. Margarine. Ghee. Bacon fat.  Other Potato and tortilla chips. Corn chips and puffs. Salted popcorn and pretzels. Canned or dried soups. Pizza. Frozen entrees and pot pies.  The items listed above may not be a complete list of foods and beverages to avoid. Contact your dietitian for more information.     This information is not intended to replace advice given to you by your health care provider. Make sure you discuss any questions you have with your health care provider.   Document Released: 05/29/2002 Document Revised: 12/28/2014 Document Reviewed: 10/11/2013 Elsevier Interactive Patient Education [employer]2016 Elsevier Inc.   If you need a refill on your cardiac medications before your next appointment, please call your pharmacy.

## 2015-12-02 NOTE — Assessment & Plan Note (Signed)
Patient underwent DC cardioversion and converted to normal sinus rhythm. He is feeling so much better. Rate is controlled. Continue Eliquis, and diltiazem.

## 2015-12-02 NOTE — Progress Notes (Signed)
Cardiology Office Note   Date:  12/02/2015   ID:  DEROLD DORSCH, DOB 08/22/1926, MRN [id number]  PCP:  Mickie Hillier, MD  Cardiologist:  Dr. Eden Emms Chief Complaint: Post hospital follow-up    History of Present Illness: Barry Burgess is a 81 y.o. male who presents for post hospital follow-up after undergoing cardioversion for atrial fibrillation. He has history of A. fib with RVR 10/14/15 in the setting of accelerated hypertension.CHADS2Vasc=3. Abixiban was initiated. Rate was controlled with calcium channel blocker. EF 55-60% on echo 10/15/15.  Patient comes in today feeling so much better. His energy is back, edema is gone and feels 20 yrs younger. He denies any further chest pain, palpitations, dyspnea, dyspnea on exertion, dizziness or presyncope.    Past Medical History  Diagnosis Date  . Hypertension   . Hyperlipidemia   . BPH (benign prostatic hyperplasia)   . Glaucoma     Past Surgical History  Procedure Laterality Date  . Pilonidal cyst / sinus excision    . Eye surgery      bilateral cataracts  . Cardioversion N/A 11/13/2015    Procedure: CARDIOVERSION;  Surgeon: Wendall Stade, MD;  Location: [facility] ENDOSCOPY;  Service: Cardiovascular;  Laterality: N/A;     Current Outpatient Prescriptions  Medication Sig Dispense Refill  . apixaban (ELIQUIS) 5 MG TABS tablet Take 1 tablet (5 mg total) by mouth 2 (two) times daily. 60 tablet 0  . aspirin EC 81 MG tablet Take 81 mg by mouth daily.    Marland Kitchen diltiazem (CARDIZEM CD) 240 MG 24 hr capsule Take 1 capsule (240 mg total) by mouth daily. 90 capsule 0  . dorzolamide-timolol (COSOPT) 22.3-6.8 MG/ML ophthalmic solution Place 1 drop into both eyes 2 (two) times daily.  6  . doxazosin (CARDURA) 4 MG tablet Take 4 mg by mouth daily.  2  . finasteride (PROSCAR) 5 MG tablet Take 5 mg by mouth daily.  2  . latanoprost (XALATAN) 0.005 % ophthalmic solution Place 1 drop into both eyes every morning.  8  . losartan (COZAAR) 25 MG  tablet Take 1 tablet (25 mg total) by mouth daily. 90 tablet 0  . Multiple Vitamins-Minerals (ICAPS PO) Take 1 tablet by mouth daily.    . pravastatin (PRAVACHOL) 80 MG tablet Take 40 mg by mouth daily.      No current facility-administered medications for this visit.    Allergies:   Review of patient's allergies indicates no known allergies.    Social History:  The patient  reports that he quit smoking about 53 years ago. His smoking use included Pipe and Cigars. He has never used smokeless tobacco. He reports that he drinks alcohol. He reports that he does not use illicit drugs.   Family History:  The patient's family history includes Stroke (age of onset: 13) in his father.    ROS:  Please see the history of present illness.   Otherwise, review of systems are positive for none.   All other systems are reviewed and negative.    PHYSICAL EXAM: VS:  Ht 6' (1.829 m)  Wt 165 lb (74.844 kg)  BMI 22.37 kg/m2 , BMI Body mass index is 22.37 kg/(m^2). GEN: Well nourished, well developed, in no acute distress Neck: no JVD, HJR, carotid bruits, or masses Cardiac: RRR; 2/6 systolic murmur at the left sternal border, no gallop, rubs, thrill or heave,  Respiratory:  clear to auscultation bilaterally, normal work of breathing GI: soft, nontender, nondistended, + BS MS: no  deformity or atrophy Extremities: without cyanosis, clubbing, edema, good distal pulses bilaterally.  Skin: warm and dry, no rash Neuro:  Strength and sensation are intact    EKG:  EKG is ordered today. The ekg ordered today demonstrates normal sinus rhythm, no acute change Recent Labs: 10/14/2015: ALT 17; B Natriuretic Peptide 345.1*; Magnesium 1.8; TSH 1.487 10/15/2015: Hemoglobin 12.7*; Platelets 161 11/12/2015: BUN 18; Creat 0.95; Potassium 4.1; Sodium 142    Lipid Panel    Component Value Date/Time   CHOL 93 10/15/2015 0214   TRIG 27 10/15/2015 0214   HDL 53 10/15/2015 0214   CHOLHDL 1.8 10/15/2015 0214    VLDL 5 10/15/2015 0214   LDLCALC 35 10/15/2015 0214      Wt Readings from Last 3 Encounters:  12/02/15 165 lb (74.844 kg)  11/13/15 171 lb (77.565 kg)  10/30/15 171 lb 6.4 oz (77.747 kg)      Other studies Reviewed: Additional studies/ records that were reviewed today include and review of the records demonstrates:  2-D echo 10/15/15 Study Conclusions  - Left ventricle: The cavity size was normal. Wall thickness was   normal. Systolic function was normal. The estimated ejection   fraction was in the range of 55% to 60%. Wall motion was normal;   there were no regional wall motion abnormalities. - Right atrium: The atrium was severely dilated    ASSESSMENT AND PLAN: Atrial fibrillation with RVR (HCC) Patient underwent DC cardioversion and converted to normal sinus rhythm. He is feeling so much better. Rate is controlled. Continue Eliquis, and diltiazem.  Accelerated hypertension Patient's blood pressure is elevated today. He admits to eating a lot of salt. He takes his blood pressure regularly at home and it's usually 135/70. I've asked him to follow a 2 g sodium diet and call us with his blood pressure readings. We can increase his Cozaar if needed. He was also on HCTZ in the past that was stopped. Follow-up with Dr. Eden EmmsNishan in 2 months.     Elson ClanSigned, Michele Lenze, PA-C  12/02/2015 10:28 AM    [facility] [address], [place], [place]  [address] Phone: [phone]; Fax: [phone]

## 2015-12-02 NOTE — Assessment & Plan Note (Signed)
Patient's blood pressure is elevated today. He admits to eating a lot of salt. He takes his blood pressure regularly at home and it's usually 135/70. I've asked him to follow a 2 g sodium diet and call us with his blood pressure readings. We can increase his Cozaar if needed. He was also on HCTZ in the past that was stopped. Follow-up with Dr. Eden EmmsNishan in 2 months.

## 2016-01-22 ENCOUNTER — Other Ambulatory Visit: Payer: Self-pay | Admitting: Cardiovascular Disease

## 2016-01-27 ENCOUNTER — Telehealth: Payer: Self-pay | Admitting: Cardiovascular Disease

## 2016-01-27 MED ORDER — APIXABAN 5 MG PO TABS: 5.0000 mg | ORAL_TABLET | Freq: Two times a day (BID) | ORAL | Status: DC

## 2016-01-27 NOTE — Telephone Encounter (Signed)
New message    *STAT* If patient is at the pharmacy, call can be transferred to refill team.   1. Which medications need to be refilled? (please list name of each medication and dose if known) eliquis  5 mg   2. Which pharmacy/location (including street and city if local pharmacy) is medication to be sent to? [facility]  - mail order   3. Do they need a 30 day or 90 day supply? 90 days supply

## 2016-01-27 NOTE — Telephone Encounter (Signed)
From discharge note from hospital 10/26 "- On AC with heparin initially, then today xarelto but per cardiology will be changed to apixaban 5 mg BID 1st dose to be given in hospital and then to continue as prescribed on discharge"  Per last office visit on 12/02/15, with Jacolyn Reedy, patient is to continue apixaban. Will send refill in.  Patient has follow-up appointment with Dr. Eden Emms in a few weeks.

## 2016-01-30 ENCOUNTER — Telehealth: Payer: Self-pay | Admitting: Cardiovascular Disease

## 2016-01-30 NOTE — Telephone Encounter (Signed)
Follow up  ° ° ° °Returning call back to nurse  °

## 2016-01-30 NOTE — Telephone Encounter (Signed)
Pt has 3 tab left of Eliquis, mail order not in yet-requesting samples--pls call

## 2016-01-30 NOTE — Telephone Encounter (Signed)
Called patient back. There were samples available. Patient will come to office to pick--up at front desk.

## 2016-02-03 NOTE — Progress Notes (Signed)
Patient ID: Barry Burgess, male   DOB: July 24, 1926, 82 y.o.   MRN: [id number] Cardiology Office Note   Date:  02/10/2016   ID:  Barry Burgess Apr 09, 1926, MRN [id number]  PCP:  Barry Hillier, MD  Cardiologist:  Dr. Eden Emms Chief Complaint: Post hospital follow-up    History of Present Illness: Barry Burgess is a 82 y.o. male post [facility] 10/2015  . He has history of A. fib with RVR 10/14/15 in the setting of accelerated hypertension.CHADS2Vasc=3. Abixiban was initiated. Rate was controlled with calcium channel blocker. EF 55-60% on echo 10/15/15.  11/13/15  Single 120 J bipasic shock delivered with conversion from afib rate 118 to NSR rate 73 Some PAC;s NSVT and elevated BP post conversion given iv lopressor 5 mg  No immediate neurologic sequelae  Doing well post [facility] with no palpitations chest pain or dyspnea No bleeding issues on regular dose of eliquis . Has new hearing aids and is thrilled with them  Told me about [employer] web site  He is very knowledgeable     Past Medical History  Diagnosis Date  . Hypertension   . Hyperlipidemia   . BPH (benign prostatic hyperplasia)   . Glaucoma     Past Surgical History  Procedure Laterality Date  . Pilonidal cyst / sinus excision    . Eye surgery      bilateral cataracts  . Cardioversion N/A 11/13/2015    Procedure: CARDIOVERSION;  Surgeon: Wendall Stade, MD;  Location: [facility] ENDOSCOPY;  Service: Cardiovascular;  Laterality: N/A;     Current Outpatient Prescriptions  Medication Sig Dispense Refill  . apixaban (ELIQUIS) 5 MG TABS tablet Take 1 tablet (5 mg total) by mouth 2 (two) times daily. 180 tablet 3  . aspirin EC 81 MG tablet Take 81 mg by mouth daily.    Marland Kitchen diltiazem (CARDIZEM CD) 240 MG 24 hr capsule Take 1 capsule by mouth  daily 90 capsule 2  . dorzolamide-timolol (COSOPT) 22.3-6.8 MG/ML ophthalmic solution Place 1 drop into both eyes 2 (two) times daily.  6  . doxazosin (CARDURA) 4 MG tablet Take 4  mg by mouth daily.  2  . finasteride (PROSCAR) 5 MG tablet Take 5 mg by mouth daily.  2  . latanoprost (XALATAN) 0.005 % ophthalmic solution Place 1 drop into both eyes every morning.  8  . losartan (COZAAR) 25 MG tablet Take 1 tablet by mouth  daily 90 tablet 2  . Multiple Vitamins-Minerals (ICAPS PO) Take 1 tablet by mouth daily.    . pravastatin (PRAVACHOL) 80 MG tablet Take 40 mg by mouth daily.      No current facility-administered medications for this visit.    Allergies:   Review of patient's allergies indicates no known allergies.    Social History:  The patient  reports that he quit smoking about 53 years ago. His smoking use included Pipe and Cigars. He has never used smokeless tobacco. He reports that he drinks alcohol. He reports that he does not use illicit drugs.   Family History:  The patient's family history includes Stroke (age of onset: 34) in his father.    ROS:  Please see the history of present illness.   Otherwise, review of systems are positive for none.   All other systems are reviewed and negative.    PHYSICAL EXAM: VS:  BP 140/64 mmHg  Pulse 64  Ht 6' (1.829 m)  Wt 78.926 kg (174 lb)  BMI 23.59 kg/m2  SpO2 95% , BMI Body mass index is 23.59 kg/(m^2). GEN: Well nourished, well developed, in no acute distress Neck: no JVD, HJR, carotid bruits, or masses Cardiac: RRR; 2/6 systolic murmur at the left sternal border, no gallop, rubs, thrill or heave,  Respiratory:  clear to auscultation bilaterally, normal work of breathing GI: soft, nontender, nondistended, + BS MS: no deformity or atrophy Extremities: without cyanosis, clubbing, edema, good distal pulses bilaterally.  Skin: warm and dry, no rash Neuro:  Strength and sensation are intact    EKG:              10/2015 normal sinus rhythm, no acute change Recent Labs: 10/14/2015: ALT 17; B Natriuretic Peptide 345.1*; Magnesium 1.8; TSH 1.487 10/15/2015: Hemoglobin 12.7*; Platelets 161 11/12/2015: BUN 18;  Creat 0.95; Potassium 4.1; Sodium 142    Lipid Panel    Component Value Date/Time   CHOL 93 10/15/2015 0214   TRIG 27 10/15/2015 0214   HDL 53 10/15/2015 0214   CHOLHDL 1.8 10/15/2015 0214   VLDL 5 10/15/2015 0214   LDLCALC 35 10/15/2015 0214      Wt Readings from Last 3 Encounters:  02/10/16 78.926 kg (174 lb)  12/02/15 74.844 kg (165 lb)  11/13/15 77.565 kg (171 lb)      Other studies Reviewed: Additional studies/ records that were reviewed today include and review of the records demonstrates:  2-D echo 10/15/15 Study Conclusions  - Left ventricle: The cavity size was normal. Wall thickness was   normal. Systolic function was normal. The estimated ejection   fraction was in the range of 55% to 60%. Wall motion was normal;   there were no regional wall motion abnormalities. - Right atrium: The atrium was severely dilated    ASSESSMENT AND PLAN: No problem-specific assessment & plan notes found for this encounter.  PAF:  Post [facility] 11/13/15  Maint NSR continue Eliquis  ChadVasc 3  D/C aspirin  Ok to have teeth cleaned without stopping eliquis HTN: Well controlled.  Continue current medications and low sodium Dash type diet.   Chol:   Cholesterol is at goal.  Continue current dose of statin and diet Rx.  No myalgias or side effects.  F/U  LFT's in 6 months. Lab Results  Component Value Date   LDLCALC 35 10/15/2015            Prostate:  FU PSA primary continue cardura and proscar    Signed, Charlton Haws, MD  02/10/2016 9:33 AM    [facility] [address] [place], [place], [place]  [address] Phone: [phone]; Fax: [phone]

## 2016-02-10 ENCOUNTER — Ambulatory Visit (INDEPENDENT_AMBULATORY_CARE_PROVIDER_SITE_OTHER): Payer: Medicare Other | Admitting: Cardiovascular Disease

## 2016-02-10 ENCOUNTER — Encounter: Payer: Self-pay | Admitting: Cardiovascular Disease

## 2016-02-10 VITALS — BP 140/64 | HR 64 | Ht 72.0 in | Wt 174.0 lb

## 2016-02-10 DIAGNOSIS — I10 Essential (primary) hypertension: Secondary | ICD-10-CM | POA: Diagnosis not present

## 2016-02-10 DIAGNOSIS — I481 Persistent atrial fibrillation: Secondary | ICD-10-CM

## 2016-02-10 DIAGNOSIS — I4819 Other persistent atrial fibrillation: Secondary | ICD-10-CM

## 2016-02-10 MED ORDER — APIXABAN 5 MG PO TABS: 5.0000 mg | ORAL_TABLET | Freq: Two times a day (BID) | ORAL | Status: DC

## 2016-02-10 NOTE — Patient Instructions (Signed)

## 2016-04-28 IMAGING — DX DG CHEST 1V PORT
2 series · 2 of 2 positions shown · non-contrast
Comparison: None.

CLINICAL DATA: Rapid and irregular heart rate.  Hypertension.

EXAM:
PORTABLE CHEST 1 VIEW

[chest ap (1 of 2)]
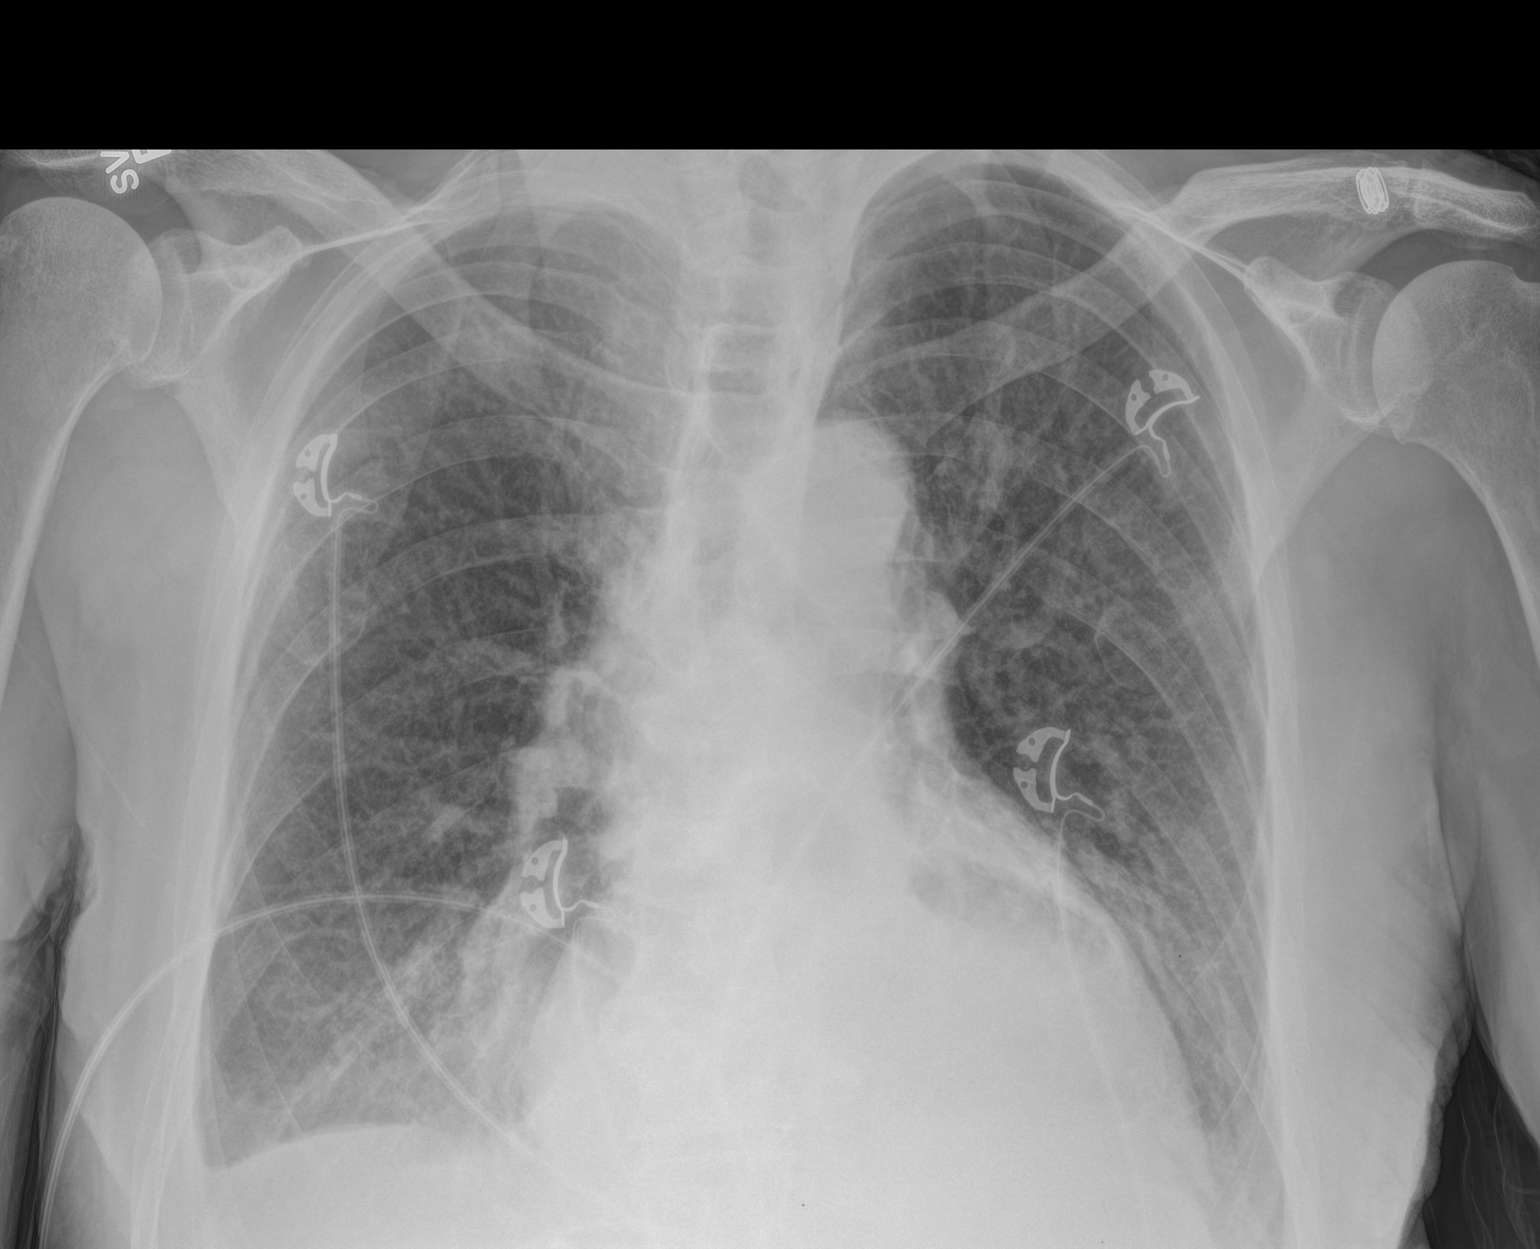

[chest ap (2 of 2)]
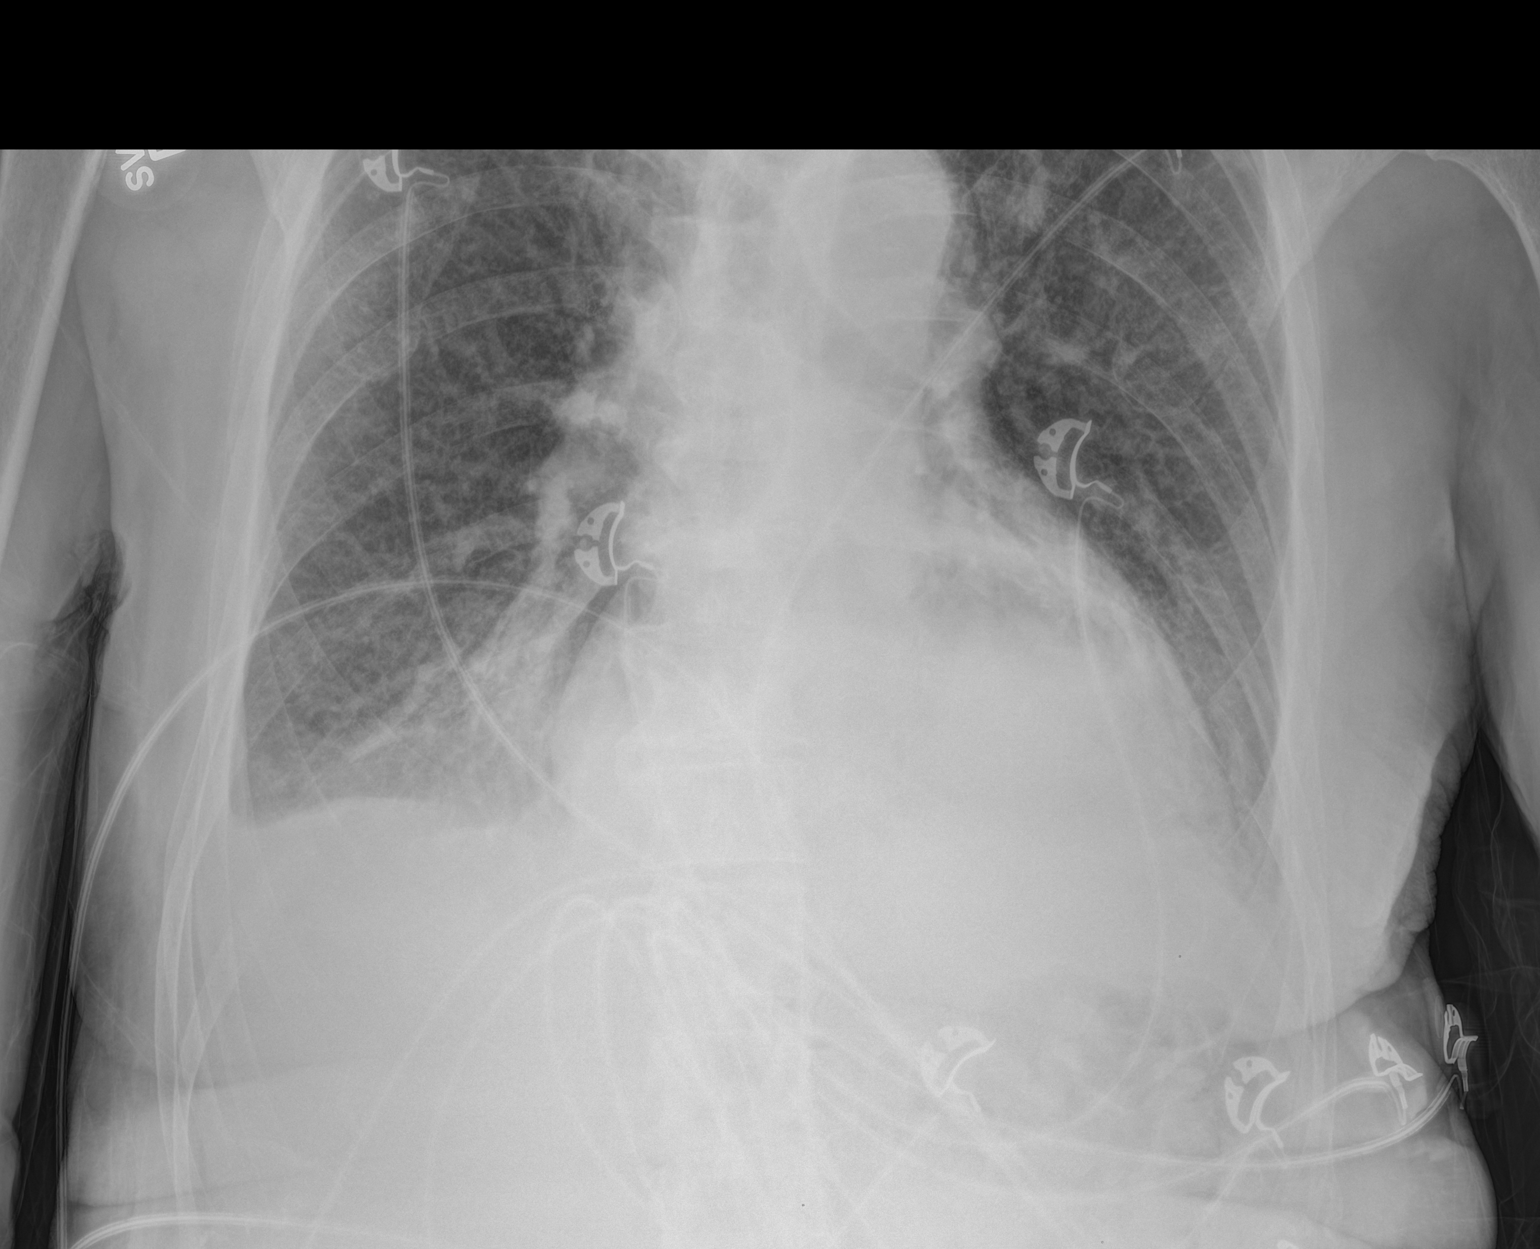

[2 of 2 positions shown; findings below may reference images not displayed]

FINDINGS: AP portable views of the chest were obtained. There is lucency in
the retrocardiac space and suspect a large hiatal hernia. Subtle
densities at both lung bases could represent pleural effusions with
atelectasis or consolidation. Lung markings are slightly prominent
without frank pulmonary edema. Heart size is upper limits of normal.
IMPRESSION: Bibasilar chest densities could represent a combination of
atelectasis/consolidation with small effusions.

Question a large hiatal hernia.

## 2016-08-09 NOTE — Progress Notes (Signed)
Cardiology Office Note:    Date:  08/10/2016   ID:  [name], DOB 27-Dec-1925, MRN [id number]  PCP:  Mickie Hillier, MD  Cardiologist:  Dr. Charlton Haws   Electrophysiologist:  n/a  Referring MD: Catha Gosselin, MD   Chief Complaint  Patient presents with  . Follow-up    Atrial fibrillation    History of Present Illness:    AARIK BLANK is a 82 y.o. male with a hx of PAF, HTN, HL, BPH, Glaucoma.  Admitted in 10/16 with AF with RVR in the setting of accelerated HTN.  CHADS2-VASc=3.  He is now on Apixaban for anticoagulation.  He ultimately underwent DCCV with restoration of NSR in 11/16.  Last seen by Dr. Eden Emms 02/10/16. He returns for follow-up.  Here alone.  He is doing very well.  Denies chest pain, significant dyspnea, orthopnea, PND, dizziness, syncope.  He has chronic LE edema and wears compression stockings.  Edema is unchanged.  He did try to hit golf balls at the driving range recently and strained his back.  He enjoys watching online courses through [employer] taught by well renowned professors around the country.  He has several history degrees.    Prior CV studies that were reviewed today include:    Echo 10/15/15 EF 55-60%, no RWMA, severe RAE  Past Medical History:  Diagnosis Date  . BPH (benign prostatic hyperplasia)   . Glaucoma   . Hyperlipidemia   . Hypertension     Past Surgical History:  Procedure Laterality Date  . CARDIOVERSION N/A 11/13/2015   Procedure: CARDIOVERSION;  Surgeon: Wendall Stade, MD;  Location: [facility] ENDOSCOPY;  Service: Cardiovascular;  Laterality: N/A;  . EYE SURGERY     bilateral cataracts  . PILONIDAL CYST / SINUS EXCISION      Current Medications: Outpatient Medications Prior to Visit  Medication Sig Dispense Refill  . apixaban (ELIQUIS) 5 MG TABS tablet Take 1 tablet (5 mg total) by mouth 2 (two) times daily. 180 tablet 3  . diltiazem (CARDIZEM CD) 240 MG 24 hr capsule Take 1 capsule by mouth  daily 90  capsule 2  . dorzolamide-timolol (COSOPT) 22.3-6.8 MG/ML ophthalmic solution Place 1 drop into both eyes 2 (two) times daily.  6  . doxazosin (CARDURA) 4 MG tablet Take 4 mg by mouth daily.  2  . finasteride (PROSCAR) 5 MG tablet Take 5 mg by mouth daily.  2  . latanoprost (XALATAN) 0.005 % ophthalmic solution Place 1 drop into both eyes every morning.  8  . losartan (COZAAR) 25 MG tablet Take 1 tablet by mouth  daily 90 tablet 2  . Multiple Vitamins-Minerals (ICAPS PO) Take 1 tablet by mouth daily.    . pravastatin (PRAVACHOL) 80 MG tablet Take 40 mg by mouth daily.     Marland Kitchen aspirin EC 81 MG tablet Take 81 mg by mouth daily.     No facility-administered medications prior to visit.       Allergies:   Review of patient's allergies indicates no known allergies.   Social History   Social History  . Marital status: Married    Spouse name: N/A  . Number of children: N/A  . Years of education: N/A   Social History Main Topics  . Smoking status: Former Smoker    Types: Pipe, Cigars    Quit date: 10/13/1962  . Smokeless tobacco: Never Used  . Alcohol use Yes     Comment: 1 glass wine per night  .  Drug use: No  . Sexual activity: Not Asked   Other Topics Concern  . None   Social History Narrative  . None     Family History:  The patient's family history includes Stroke (age of onset: 6292) in his father.   ROS:   Please see the history of present illness.    ROS All other systems reviewed and are negative.   EKGs/Labs/Other Test Reviewed:    EKG:  EKG is  ordered today.  The ekg ordered today demonstrates NSR, HR 79, rightward axis, nonspecific ST-T wave changes, QTc 440 ms, no significant change when compared to prior tracing dated 12/02/15  Recent Labs: 10/14/2015: ALT 17; B Natriuretic Peptide 345.1; Magnesium 1.8; TSH 1.487 10/15/2015: Hemoglobin 12.7; Platelets 161 11/12/2015: BUN 18; Creat 0.95; Potassium 4.1; Sodium 142   Recent Lipid Panel    Component Value  Date/Time   CHOL 93 10/15/2015 0214   TRIG 27 10/15/2015 0214   HDL 53 10/15/2015 0214   CHOLHDL 1.8 10/15/2015 0214   VLDL 5 10/15/2015 0214   LDLCALC 35 10/15/2015 0214   Labs from PCP dated 07/03/16: Creatinine 1.07, LDL 52, TSH 2.95, hemoglobin 14.3, ALT 11.  Physical Exam:    VS:  BP 140/70   Pulse 80   Ht 6' 0.5" (1.842 m)   Wt 175 lb (79.4 kg)   BMI 23.41 kg/m     Wt Readings from Last 3 Encounters:  08/10/16 175 lb (79.4 kg)  02/10/16 174 lb (78.9 kg)  12/02/15 165 lb (74.8 kg)     Physical Exam  Constitutional: He is oriented to person, place, and time. He appears well-developed and well-nourished. No distress.  HENT:  Head: Normocephalic and atraumatic.  Neck: Normal range of motion. No JVD present.  Cardiovascular: Normal rate, regular rhythm, S1 normal and S2 normal.   No murmur heard. Pulmonary/Chest: Effort normal and breath sounds normal. He has no wheezes. He has no rhonchi. He has no rales.  Abdominal: Soft. There is no tenderness.  Musculoskeletal: He exhibits edema.  1+ bilateral LE edema, stockings in place  Neurological: He is alert and oriented to person, place, and time.  Skin: Skin is warm and dry.  Psychiatric: He has a normal mood and affect.    ASSESSMENT:    1. Paroxysmal atrial fibrillation (HCC)   2. Essential hypertension    PLAN:    In order of problems listed above:  1. PAF - Patient remains in normal sinus rhythm. He underwent DCCV in 11/16. He is doing well symptomatically. He is tolerating anticoagulation. His weight is greater than 60 kg and his creatinine is remains less than 1.5. Continue apixaban 5 mg twice a day. I have again asked him to stop taking aspirin.   2. HTN:  At target based upon current J[place] guidelines.  Continue current Rx.    Medication Adjustments/Labs and Tests Ordered: Current medicines are reviewed at length with the patient today.  Concerns regarding medicines are outlined above.  Medication changes,  Labs and Tests ordered today are outlined in the Patient Instructions noted below. Patient Instructions  Medication Instructions:  1. STOP ASPIRIN Labwork: NONE Testing/Procedures: NONE Follow-Up: Your physician wants you to follow-up in: 6 MONTHS WITH DR. Eden EmmsNISHAN You will receive a reminder letter in the mail two months in advance. If you don't receive a letter, please call our office to schedule the follow-up appointment. Any Other Special Instructions Will Be Listed Below (If Applicable). If you need a refill on  your cardiac medications before your next appointment, please call your pharmacy.  Signed, Tereso NewcomerScott Dustine Bertini, PA-C  08/10/2016 8:53 AM    [facility] [address], [place], [place]  [address] Phone: [phone]([phone]; Fax: [phone]

## 2016-08-10 ENCOUNTER — Encounter: Payer: Self-pay | Admitting: Physician Assistant

## 2016-08-10 ENCOUNTER — Ambulatory Visit (INDEPENDENT_AMBULATORY_CARE_PROVIDER_SITE_OTHER): Payer: Medicare Other | Admitting: Physician Assistant

## 2016-08-10 VITALS — BP 140/70 | HR 80 | Ht 72.5 in | Wt 175.0 lb

## 2016-08-10 DIAGNOSIS — I10 Essential (primary) hypertension: Secondary | ICD-10-CM

## 2016-08-10 DIAGNOSIS — I480 Paroxysmal atrial fibrillation: Secondary | ICD-10-CM

## 2016-08-10 NOTE — Patient Instructions (Addendum)
Medication Instructions:  1. STOP ASPIRIN Labwork: NONE Testing/Procedures: NONE Follow-Up: Your physician wants you to follow-up in: 6 MONTHS WITH DR. Eden EmmsNISHAN You will receive a reminder letter in the mail two months in advance. If you don't receive a letter, please call our office to schedule the follow-up appointment. Any Other Special Instructions Will Be Listed Below (If Applicable). If you need a refill on your cardiac medications before your next appointment, please call your pharmacy.

## 2016-09-22 ENCOUNTER — Other Ambulatory Visit: Payer: Self-pay | Admitting: Cardiovascular Disease

## 2016-12-17 ENCOUNTER — Other Ambulatory Visit: Payer: Self-pay | Admitting: Cardiovascular Disease

## 2017-05-17 ENCOUNTER — Other Ambulatory Visit: Payer: Self-pay | Admitting: Cardiovascular Disease

## 2017-05-18 NOTE — Telephone Encounter (Signed)
Called PCP Dr Clarene DukeLittle for more recent labs - pt had BMET drawn July 03 2016 and SCr was 1.07, Hgb 14.3. Labs stable to continue Eliquis 5mg  BID dosing.

## 2017-09-05 ENCOUNTER — Other Ambulatory Visit: Payer: Self-pay | Admitting: Cardiovascular Disease

## 2017-09-30 NOTE — Progress Notes (Signed)
Cardiology Office Note:    Date:  10/04/2017   ID:  Barry Burgess, DOB 22-Apr-1926, MRN [id number]  PCP:  Catha Gosselin, MD  Cardiologist:  Dr. Charlton Haws   Electrophysiologist:  n/a  Referring MD: Catha Gosselin, MD   No chief complaint on file.   History of Present Illness:    Barry Burgess is a 83 y.o. male with a hx of PAF, HTN, HL, BPH, Glaucoma. I have note seen since 01/2016   Admitted in 10/16 with AF with RVR in the setting of accelerated HTN.  CHADS2-VASc=3.  He is now on Apixaban for anticoagulation.  He ultimately underwent DCCV with restoration of NSR in 11/16.  He is doing very well.  Denies chest pain, significant dyspnea, orthopnea, PND, dizziness, syncope.  He has chronic LE edema and wears compression stockings.  Edema is unchanged.   He enjoys watching online courses through "[employer] taught by well renowned professors around the country.  He has several history degrees.  No complaints     Prior CV studies that were reviewed today include:    Echo 10/15/15 EF 55-60%, no RWMA, severe RAE  Past Medical History:  Diagnosis Date  . BPH (benign prostatic hyperplasia)   . Glaucoma   . Hyperlipidemia   . Hypertension     Past Surgical History:  Procedure Laterality Date  . CARDIOVERSION N/A 11/13/2015   Procedure: CARDIOVERSION;  Surgeon: Wendall Stade, MD;  Location: [facility] ENDOSCOPY;  Service: Cardiovascular;  Laterality: N/A;  . EYE SURGERY     bilateral cataracts  . PILONIDAL CYST / SINUS EXCISION      Current Medications: Outpatient Medications Prior to Visit  Medication Sig Dispense Refill  . dorzolamide-timolol (COSOPT) 22.3-6.8 MG/ML ophthalmic solution Place 1 drop into both eyes 2 (two) times daily.  6  . doxazosin (CARDURA) 4 MG tablet Take 4 mg by mouth daily.  2  . ELIQUIS 5 MG TABS tablet TAKE 1 TABLET BY MOUTH TWO  TIMES DAILY 180 tablet 1  . finasteride (PROSCAR) 5 MG tablet Take 5 mg by mouth daily.  2  . latanoprost  (XALATAN) 0.005 % ophthalmic solution Place 1 drop into both eyes every morning.  8  . losartan (COZAAR) 25 MG tablet TAKE 1 TABLET BY MOUTH  DAILY 90 tablet 0  . Multiple Vitamins-Minerals (ICAPS PO) Take 1 tablet by mouth daily.    . pravastatin (PRAVACHOL) 80 MG tablet Take 40 mg by mouth daily.     Marland Kitchen diltiazem (CARDIZEM CD) 240 MG 24 hr capsule TAKE 1 CAPSULE BY MOUTH  DAILY 90 capsule 0   No facility-administered medications prior to visit.       Allergies:   Patient has no known allergies.   Social History   Social History  . Marital status: Married    Spouse name: N/A  . Number of children: N/A  . Years of education: N/A   Social History Main Topics  . Smoking status: Former Smoker    Types: Pipe, Cigars    Quit date: 10/13/1962  . Smokeless tobacco: Never Used  . Alcohol use Yes     Comment: 1 glass wine per night  . Drug use: No  . Sexual activity: Not Asked   Other Topics Concern  . None   Social History Narrative  . None     Family History:  The patient's family history includes Stroke (age of onset: 12) in his father.   ROS:  Please see the history of present illness.    ROS All other systems reviewed and are negative.   EKGs/Labs/Other Test Reviewed:    EKG:  08/10/16  NSR, HR 79, rightward axis, nonspecific ST-T wave changes, QTc 440 ms, no significant change when compared to prior tracing dated 12/02/15 10/04/17  Afib rate 92 ICRBBB   Recent Labs: No results found for requested labs within last 8760 hours.   Recent Lipid Panel    Component Value Date/Time   CHOL 93 10/15/2015 0214   TRIG 27 10/15/2015 0214   HDL 53 10/15/2015 0214   CHOLHDL 1.8 10/15/2015 0214   VLDL 5 10/15/2015 0214   LDLCALC 35 10/15/2015 0214   Labs from PCP dated 07/03/16: Creatinine 1.07, LDL 52, TSH 2.95, hemoglobin 14.3, ALT 11.  Physical Exam:    VS:  BP 134/82   Pulse 92   Ht 6' 0.5" (1.842 m)   Wt 166 lb 4 oz (75.4 kg)   BMI 22.24 kg/m     Wt Readings  from Last 3 Encounters:  10/04/17 166 lb 4 oz (75.4 kg)  08/10/16 175 lb (79.4 kg)  02/10/16 174 lb (78.9 kg)     Physical Exam  Constitutional: He is oriented to person, place, and time. He appears well-developed and well-nourished. No distress.  HENT:  Head: Normocephalic and atraumatic.  Skewed vision right eye blind   Neck: Normal range of motion. No JVD present.  Cardiovascular: Normal rate, regular rhythm, S1 normal and S2 normal.   No murmur heard. Pulmonary/Chest: Effort normal and breath sounds normal. He has no wheezes. He has no rhonchi. He has no rales.  Abdominal: Soft. There is no tenderness.  Musculoskeletal: He exhibits edema.  1+ bilateral LE edema, stockings in place  Neurological: He is alert and oriented to person, place, and time.  Head bobbing tremor and mild  Tremor in UE;s   Skin: Skin is warm and dry.  Psychiatric: He has a normal mood and affect.    ASSESSMENT:    1. Atrial fibrillation with RVR (HCC)    PLAN:    In order of problems listed above:  1. PAF - In afib today .  He underwent DCCV in 11/16. He is doing well symptomatically. He is tolerating anticoagulation. His weight is greater than 60 kg and his creatinine is remains less than 1.5. Continue apixaban 5 mg twice a day.    2. HTN: Well controlled.  Continue current medications and low sodium Dash type diet.     Charlton Haws

## 2017-10-04 ENCOUNTER — Ambulatory Visit (INDEPENDENT_AMBULATORY_CARE_PROVIDER_SITE_OTHER): Payer: Medicare Other | Admitting: Cardiovascular Disease

## 2017-10-04 ENCOUNTER — Encounter: Payer: Self-pay | Admitting: Cardiovascular Disease

## 2017-10-04 VITALS — BP 134/82 | HR 92 | Ht 72.5 in | Wt 166.2 lb

## 2017-10-04 DIAGNOSIS — I4891 Unspecified atrial fibrillation: Secondary | ICD-10-CM

## 2017-10-04 MED ORDER — DILTIAZEM HCL ER COATED BEADS 360 MG PO CP24: 360.0000 mg | ORAL_CAPSULE | Freq: Every day | ORAL | 3 refills | Status: DC

## 2017-10-04 NOTE — Patient Instructions (Addendum)
Medication Instructions:  Your physician has recommended you make the following change in your medication:  1-INCREASE Cardizem 360 mg by mouth daily  Labwork: NONE  Testing/Procedures: Your physician has requested that you have an echocardiogram. Echocardiography is a painless test that uses sound waves to create images of your heart. It provides your doctor with information about the size and shape of your heart and how well your heart's chambers and valves are working. This procedure takes approximately one hour. There are no restrictions for this procedure.  Follow-Up: Your physician wants you to follow-up in: 6 months with Dr. Eden Emms. You will receive a reminder letter in the mail two months in advance. If you don't receive a letter, please call our office to schedule the follow-up appointment.   If you need a refill on your cardiac medications before your next appointment, please call your pharmacy.

## 2017-10-04 NOTE — Addendum Note (Signed)
Addended by: Virl Axe, Shaliyah Taite L on: 10/04/2017 09:37 AM   Modules accepted: Orders

## 2017-10-08 ENCOUNTER — Other Ambulatory Visit: Payer: Self-pay

## 2017-10-08 ENCOUNTER — Ambulatory Visit (HOSPITAL_COMMUNITY): Payer: Medicare Other | Attending: Cardiovascular Disease

## 2017-10-08 DIAGNOSIS — I4891 Unspecified atrial fibrillation: Secondary | ICD-10-CM | POA: Insufficient documentation

## 2017-10-08 DIAGNOSIS — I083 Combined rheumatic disorders of mitral, aortic and tricuspid valves: Secondary | ICD-10-CM | POA: Diagnosis not present

## 2017-10-08 DIAGNOSIS — I119 Hypertensive heart disease without heart failure: Secondary | ICD-10-CM | POA: Diagnosis not present

## 2017-10-08 DIAGNOSIS — E785 Hyperlipidemia, unspecified: Secondary | ICD-10-CM | POA: Insufficient documentation

## 2017-10-12 ENCOUNTER — Telehealth: Payer: Self-pay | Admitting: Cardiovascular Disease

## 2017-10-12 NOTE — Telephone Encounter (Signed)
New message ° ° ° °Patient calling for echo results. Please call °

## 2017-10-12 NOTE — Telephone Encounter (Signed)
Patient aware of echo results.

## 2017-12-02 ENCOUNTER — Other Ambulatory Visit: Payer: Self-pay | Admitting: Cardiovascular Disease

## 2017-12-02 NOTE — Telephone Encounter (Addendum)
Eliquis 5mg  refill request received, pt is 83 yrs old, Wt-75.4kg, last seen on 10/04/17 by Dr. Eden EmmsNishan , last Creatinine was done in 2016, therefore called PCP Dr. Fredirick MaudlinLittle's office & requested latest CMET/BMET & CBC to be faxed over to our office. Received labs from PCP & Crea-1.03 on 07/09/17. Thereforem refill request sent as requested to requested Pharmacy.

## 2018-04-21 NOTE — Progress Notes (Signed)
Cardiology Office Note:    Date:  04/22/2018   ID:  Barry Burgess, DOB 16-Oct-1926, MRN [id number]  PCP:  Catha Gosselin, MD  Cardiologist:  Dr. Charlton Haws   Electrophysiologist:  n/a  Referring MD: Catha Gosselin, MD   No chief complaint on file.   History of Present Illness:    84 y.o. history of HLD, HTN, and PAF. CHA2DVASC 3 on eliquis for anticoagulation. Last [facility] 10/2015 Still watching "[employer] " and has several history degrees. 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   TTE 10/08/17 EF 60-65% Moderate AR  Mild MR Severe LAE  No complaints     Prior CV studies that were reviewed today include:    TTE 2018 see above   Past Medical History:  Diagnosis Date  . BPH (benign prostatic hyperplasia)   . Glaucoma   . Hyperlipidemia   . Hypertension     Past Surgical History:  Procedure Laterality Date  . CARDIOVERSION N/A 11/13/2015   Procedure: CARDIOVERSION;  Surgeon: Wendall Stade, MD;  Location: [facility] ENDOSCOPY;  Service: Cardiovascular;  Laterality: N/A;  . EYE SURGERY     bilateral cataracts  . PILONIDAL CYST / SINUS EXCISION      Current Medications: Outpatient Medications Prior to Visit  Medication Sig Dispense Refill  . diltiazem (CARDIZEM CD) 360 MG 24 hr capsule Take 1 capsule (360 mg total) by mouth daily. 90 capsule 3  . dorzolamide-timolol (COSOPT) 22.3-6.8 MG/ML ophthalmic solution Place 1 drop into both eyes 2 (two) times daily.  6  . doxazosin (CARDURA) 4 MG tablet Take 4 mg by mouth daily.  2  . ELIQUIS 5 MG TABS tablet TAKE 1 TABLET BY MOUTH TWO  TIMES DAILY 180 tablet 2  . finasteride (PROSCAR) 5 MG tablet Take 5 mg by mouth daily.  2  . latanoprost (XALATAN) 0.005 % ophthalmic solution Place 1 drop into both eyes every morning.  8  . losartan (COZAAR) 25 MG tablet TAKE 1 TABLET BY MOUTH  DAILY 90 tablet 3  . Multiple Vitamins-Minerals (ICAPS PO) Take 1 tablet by mouth daily.    . pravastatin (PRAVACHOL) 80 MG  tablet Take 40 mg by mouth daily.      No facility-administered medications prior to visit.       Allergies:   Patient has no known allergies.   Social History   Socioeconomic History  . Marital status: Married    Spouse name: Not on file  . Number of children: Not on file  . Years of education: Not on file  . Highest education level: Not on file  Occupational History  . Not on file  Social Needs  . Financial resource strain: Not on file  . Food insecurity:    Worry: Not on file    Inability: Not on file  . Transportation needs:    Medical: Not on file    Non-medical: Not on file  Tobacco Use  . Smoking status: Former Smoker    Types: Pipe, Cigars    Last attempt to quit: 10/13/1962    Years since quitting: 55.5  . Smokeless tobacco: Never Used  Substance and Sexual Activity  . Alcohol use: Yes    Comment: 1 glass wine per night  . Drug use: No  . Sexual activity: Not on file  Lifestyle  . Physical activity:    Days per week: Not on file    Minutes per session: Not on file  .  Stress: Not on file  Relationships  . Social connections:    Talks on phone: Not on file    Gets together: Not on file    Attends religious service: Not on file    Active member of club or organization: Not on file    Attends meetings of clubs or organizations: Not on file    Relationship status: Not on file  Other Topics Concern  . Not on file  Social History Narrative  . Not on file     Family History:  The patient's family history includes Stroke (age of onset: 44) in his father.   ROS:   Please see the history of present illness.    ROS All other systems reviewed and are negative.   EKGs/Labs/Other Test Reviewed:    EKG:  08/10/16  NSR, HR 79, rightward axis, nonspecific ST-T wave changes, QTc 440 ms, no significant change when compared to prior tracing dated 12/02/15 10/04/17  Afib rate 92 ICRBBB   Recent Labs: No results found for requested labs within last 8760 hours.    Recent Lipid Panel    Component Value Date/Time   CHOL 93 10/15/2015 0214   TRIG 27 10/15/2015 0214   HDL 53 10/15/2015 0214   CHOLHDL 1.8 10/15/2015 0214   VLDL 5 10/15/2015 0214   LDLCALC 35 10/15/2015 0214   Labs from PCP dated 07/03/16: Creatinine 1.07, LDL 52, TSH 2.95, hemoglobin 14.3, ALT 11.  Physical Exam:    VS:  BP (!) 148/82   Pulse (!) 103   Ht 6' 0.5" (1.842 m)   Wt 174 lb 12 oz (79.3 kg)   SpO2 96%   BMI 23.37 kg/m     Wt Readings from Last 3 Encounters:  04/22/18 174 lb 12 oz (79.3 kg)  10/04/17 166 lb 4 oz (75.4 kg)  08/10/16 175 lb (79.4 kg)     Affect appropriate Healthy:  appears stated age HEENT: normal Neck supple with no adenopathy JVP normal no bruits no thyromegaly Lungs clear with no wheezing and good diaphragmatic motion Heart:  S1/S2 SEM/ and AR murmur, no rub, gallop or click PMI normal Abdomen: benighn, BS positve, no tenderness, no AAA no bruit.  No HSM or HJR Distal pulses intact with no bruits No edema Neuro non-focal Skin warm and dry No muscular weakness   ASSESSMENT:    1. Paroxysmal atrial fibrillation (HCC)   2. Essential hypertension    PLAN:    In order of problems listed above:  1. PAF - asymptomatic continue anticoagulation with eliquis    2. HTN: Well controlled.  Continue current medications and low sodium Dash type diet.   3. AR:  Asymptomatic and compensated LV size and function no need for SBE BP well Controlled follow and repeat echo for any new symptoms     Charlton Haws

## 2018-04-22 ENCOUNTER — Encounter: Payer: Self-pay | Admitting: Cardiovascular Disease

## 2018-04-22 ENCOUNTER — Ambulatory Visit: Payer: Medicare Other | Admitting: Cardiovascular Disease

## 2018-04-22 VITALS — BP 148/82 | HR 103 | Ht 72.5 in | Wt 174.8 lb

## 2018-04-22 DIAGNOSIS — I10 Essential (primary) hypertension: Secondary | ICD-10-CM

## 2018-04-22 DIAGNOSIS — I480 Paroxysmal atrial fibrillation: Secondary | ICD-10-CM | POA: Diagnosis not present

## 2018-04-22 NOTE — Patient Instructions (Signed)

## 2018-08-27 ENCOUNTER — Other Ambulatory Visit: Payer: Self-pay | Admitting: Cardiovascular Disease

## 2018-09-18 ENCOUNTER — Other Ambulatory Visit: Payer: Self-pay | Admitting: Cardiovascular Disease

## 2018-09-19 NOTE — Telephone Encounter (Signed)
Pt last saw Dr Eden Emms 04/22/18, last labs 08/10/18 Creat 1.07, age 84, weight 79.3kg, based on specified criteria pt is on appropriate dosage of Eliquis 5mg  BID.  Will refill rx.

## 2018-12-28 ENCOUNTER — Other Ambulatory Visit: Payer: Self-pay | Admitting: Cardiovascular Disease

## 2019-03-16 ENCOUNTER — Other Ambulatory Visit: Payer: Self-pay | Admitting: Cardiovascular Disease

## 2019-04-28 ENCOUNTER — Other Ambulatory Visit: Payer: Self-pay | Admitting: Cardiovascular Disease

## 2019-04-28 MED ORDER — DILTIAZEM HCL ER COATED BEADS 360 MG PO CP24: 360.0000 mg | ORAL_CAPSULE | Freq: Every day | ORAL | 0 refills | Status: DC

## 2019-04-28 NOTE — Telephone Encounter (Signed)
Pt's medication was sent to pt's pharmacy as requested. Confirmation received.  °

## 2019-05-19 ENCOUNTER — Telehealth: Payer: Self-pay

## 2019-05-19 NOTE — Telephone Encounter (Signed)
Left message for patient to call back because his upcoming virtual visit with Dr Mayford Knife needs to be rescheduled due to provider having a meeting.

## 2019-05-23 ENCOUNTER — Telehealth: Payer: Medicare Other | Admitting: Cardiology

## 2019-05-25 ENCOUNTER — Telehealth: Payer: Medicare Other | Admitting: Cardiology

## 2019-05-25 ENCOUNTER — Other Ambulatory Visit: Payer: Self-pay

## 2019-06-26 ENCOUNTER — Telehealth: Payer: Self-pay

## 2019-06-26 NOTE — Telephone Encounter (Signed)
Call patient regarding appointment with Dr. [name] on 7/15 but there was no answer and couldn't leave voicemail.

## 2019-06-29 NOTE — Telephone Encounter (Signed)
Patient is returning call.  °

## 2019-06-30 NOTE — Telephone Encounter (Signed)
Left message for patient to call back about appointment with Dr. [name] on 7/15 at 10:15 to change from virtual visit to office visit.

## 2019-07-02 NOTE — Progress Notes (Signed)
Virtual Visit via Video Note   This visit type was conducted due to national recommendations for restrictions regarding the COVID-19 Pandemic (e.g. social distancing) in an effort to limit this patient's exposure and mitigate transmission in our community.  Due to his co-morbid illnesses, this patient is at least at moderate risk for complications without adequate follow up.  This format is felt to be most appropriate for this patient at this time.  All issues noted in this document were discussed and addressed.  A limited physical exam was performed with this format.  Please refer to the patient's chart for his consent to telehealth for [facility].   Date:  07/05/2019   ID:  Barry Burgess, DOB 12/06/1926, MRN [id number]  Patient Location: Home Provider Location: Office  PCP:  Catha GosselinLittle, Kevin, MD  Cardiologist:   Eden EmmsNishan Electrophysiologist:  None   Evaluation Performed:  Follow-Up Visit  Chief Complaint:  Afib  History of Present Illness:    85 y.o. history of HLD, HTN, and PAF. CHA2DVASC 3 on eliquis for anticoagulation. [facility]2015 Still watching "[employer] " and has several history degrees. 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   TTE 10/08/17 EF 60-65% Moderate AR  Mild MR Severe LAE  No complaints    Lucila MaineGrandson is helping him   The patient  does not have symptoms concerning for COVID-19 infection (fever, chills, cough, or new shortness of breath).    Past Medical History:  Diagnosis Date  . BPH (benign prostatic hyperplasia)   . Glaucoma   . Hyperlipidemia   . Hypertension    Past Surgical History:  Procedure Laterality Date  . CARDIOVERSION N/A 11/13/2015   Procedure: CARDIOVERSION;  Surgeon: Wendall StadePeter C Sartaj Hoskin, MD;  [facility];  Service: Cardiovascular;  Laterality: N/A;  . EYE SURGERY     bilateral cataracts  . PILONIDAL CYST / SINUS EXCISION       Current Meds  Medication Sig  . diltiazem (CARDIZEM CD)  360 MG 24 hr capsule Take 1 capsule (360 mg total) by mouth daily. Please make overdue appt with Dr. Eden EmmsNishan before anymore refills. 1st attempt  . dorzolamide-timolol (COSOPT) 22.3-6.8 MG/ML ophthalmic solution Place 1 drop into both eyes 2 (two) times daily.  Marland Kitchen. doxazosin (CARDURA) 4 MG tablet Take 4 mg by mouth daily.  Marland Kitchen. ELIQUIS 5 MG TABS tablet TAKE 1 TABLET BY MOUTH TWO  TIMES DAILY  . finasteride (PROSCAR) 5 MG tablet Take 5 mg by mouth daily.  Marland Kitchen. latanoprost (XALATAN) 0.005 % ophthalmic solution Place 1 drop into both eyes every morning.  Marland Kitchen. losartan (COZAAR) 25 MG tablet TAKE 1 TABLET BY MOUTH  DAILY  . pravastatin (PRAVACHOL) 80 MG tablet Take 40 mg by mouth daily.      Allergies:   Patient has no known allergies.   Social History   Tobacco Use  . Smoking status: Former Smoker    Types: Pipe, Cigars    Quit date: 10/13/1962    Years since quitting: 56.7  . Smokeless tobacco: Never Used  Substance Use Topics  . Alcohol use: Yes    Comment: 1 glass wine per night  . Drug use: No     Family Hx: The patient's family history includes Stroke (age of onset: 2692) in his father.  ROS:   Please see the history of present illness.     All other systems reviewed and are negative.   Prior CV studies:   The following  studies were reviewed today:  Echo 09/2018 EF 60-65% moderate AR mild MR severe LAE   Labs/Other Tests and Data Reviewed:    EKG:  Not done this visit   Recent Labs: No results found for requested labs within last 8760 hours.   Recent Lipid Panel Lab Results  Component Value Date/Time   CHOL 93 10/15/2015 02:14 AM   TRIG 27 10/15/2015 02:14 AM   HDL 53 10/15/2015 02:14 AM   CHOLHDL 1.8 10/15/2015 02:14 AM   LDLCALC 35 10/15/2015 02:14 AM    Wt Readings from Last 3 Encounters:  04/22/18 174 lb 12 oz (79.3 kg)  10/04/17 166 lb 4 oz (75.4 kg)  08/10/16 175 lb (79.4 kg)     Objective:    Vital Signs:  BP 124/68   Pulse 74   Ht 6' 0.5" (1.842 m)   BMI  23.37 kg/m    Skin warm and dry No distress No tachypnea No JVP elevation  Neuro appears non focal No edema   ASSESSMENT & PLAN:    1. PAF - asymptomatic continue anticoagulation with eliquis    2. HTN: Well controlled.  Continue current medications and low sodium Dash type diet.   3. AR:  Asymptomatic and compensated LV size and function no need for SBE BP well Controlled follow and repeat echo for any new symptoms given age    COVID-75 Education: The signs and symptoms of COVID-19 were discussed with the patient and how to seek care for testing (follow up with PCP or arrange E-visit).  The importance of social distancing was discussed today.  Time:   Today, I have spent 30 minutes with the patient with telehealth technology discussing the above problems.     Medication Adjustments/Labs and Tests Ordered: Current medicines are reviewed at length with the patient today.  Concerns regarding medicines are outlined above.   Tests Ordered:  None  Medication Changes:  None   Disposition:  Follow up in a year   Signed, Jenkins Rouge, MD  07/05/2019 10:24 AM    [place]

## 2019-07-04 ENCOUNTER — Telehealth: Payer: Self-pay | Admitting: Cardiovascular Disease

## 2019-07-04 NOTE — Telephone Encounter (Signed)
New Message ° ° ° °Left message to confirm appt and get consent  °

## 2019-07-05 ENCOUNTER — Telehealth (INDEPENDENT_AMBULATORY_CARE_PROVIDER_SITE_OTHER): Payer: Medicare Other | Admitting: Cardiovascular Disease

## 2019-07-05 ENCOUNTER — Encounter: Payer: Self-pay | Admitting: Cardiovascular Disease

## 2019-07-05 ENCOUNTER — Other Ambulatory Visit: Payer: Self-pay

## 2019-07-05 DIAGNOSIS — I482 Chronic atrial fibrillation, unspecified: Secondary | ICD-10-CM

## 2019-07-05 NOTE — Patient Instructions (Signed)
Medication Instructions:  No changes If you need a refill on your cardiac medications before your next appointment, please call your pharmacy.   Lab work: none If you have labs (blood work) drawn today and your tests are completely normal, you will receive your results only by: Marland Kitchen MyChart Message (if you have MyChart) OR . A paper copy in the mail If you have any lab test that is abnormal or we need to change your treatment, we will call you to review the results.  Testing/Procedures: none  Follow-Up: At [facility] you and your health needs are our priority.  As part of our continuing mission to provide you with exceptional heart care, we have created designated Provider Care Teams.  These Care Teams include your primary Cardiologist (physician) and Advanced Practice Providers (APPs -  Physician Assistants and Nurse Practitioners) who all work together to provide you with the care you need, when you need it. You will need a follow up appointment in 12 months.  Please call our office 2 months in advance to schedule this appointment.  You may see Dr. Jenkins Rouge or one of the following Advanced Practice Providers on your designated Care Team:   Truitt Merle, NP Cecilie Kicks, NP . Kathyrn Drown, NP  Any Other Special Instructions Will Be Listed Below (If Applicable).

## 2019-07-22 ENCOUNTER — Other Ambulatory Visit: Payer: Self-pay | Admitting: Cardiovascular Disease

## 2019-08-12 ENCOUNTER — Other Ambulatory Visit: Payer: Self-pay | Admitting: Cardiovascular Disease

## 2019-08-15 ENCOUNTER — Other Ambulatory Visit: Payer: Self-pay | Admitting: Cardiovascular Disease

## 2019-08-16 NOTE — Telephone Encounter (Signed)
Eliquis 5mg  refill request received, pt is 85 yrs old, weight-79.3kg, Crea-1.07 on 08/10/2018 via PCP, Diagnosis-Afib, and last Telemedicine visit by Dr. [name] on 07/05/2019. Pt is unable to come out during the Covid 19 pandemic so called the pt's PCP for current lab work.  Pt needs current labwork so called Dr. Eddie Dibbles office and they stated they would fax over the results they have from the past year. Will await labwork.  Will send a supply for the pt and follow up.

## 2019-10-11 ENCOUNTER — Telehealth: Payer: Self-pay

## 2019-10-11 NOTE — Telephone Encounter (Signed)
Called pt to set up OV with PN 10/22, left message asking pt to call the office.  

## 2019-10-28 ENCOUNTER — Other Ambulatory Visit: Payer: Self-pay | Admitting: Cardiovascular Disease

## 2019-10-30 NOTE — Telephone Encounter (Signed)
Pt last saw Dr [name] 07/05/19 telemedicine Covid-19, last labs 10/20/19 at [facility] per [facility] Creat 0.99, age 85, weight 79.3kg, based on specified criteria pt is on appropriate dosage of Eliquis 5mg  BID.  Will refill rx.

## 2020-01-19 ENCOUNTER — Telehealth: Payer: Medicare Other | Admitting: Cardiovascular Disease

## 2020-03-29 ENCOUNTER — Telehealth: Payer: Self-pay | Admitting: Cardiovascular Disease

## 2020-03-29 NOTE — Telephone Encounter (Signed)
   Pt is adamant to speak with Dr. Eden Emms he has question about covid 19 vaccine.   Please call

## 2020-03-29 NOTE — Telephone Encounter (Signed)
Informed the patient Dr. Eden Emms is not in today. The patient states he just wants help arranging his COVID 19 vaccine. Scheduled the patient for tomorrow at the [facility] vaccination site.  Scheduled him for his 6 month visit with Dr. Eden Emms 6/2 (when his vaccinations are complete). He was grateful for assistance.

## 2020-03-30 ENCOUNTER — Ambulatory Visit: Payer: Medicare Other | Attending: Internal Medicine

## 2020-05-22 ENCOUNTER — Ambulatory Visit: Payer: Medicare Other | Admitting: Cardiovascular Disease

## 2020-07-04 NOTE — Progress Notes (Deleted)
Date:  07/04/2020   ID:  Barry Burgess, DOB [birth year], MRN [id number]  PCP:  Catha Gosselin, MD  Cardiologist:   Eden Emms Electrophysiologist:  None   Evaluation Performed:  Follow-Up Visit  Chief Complaint:  Afib  History of Present Illness:    86 y.o. history of HLD, HTN, and PAF. CHA2DVASC 3 on eliquis for anticoagulation. Last [facility] 10/2015 Still watching "[employer] " and has several history degrees. 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   TTE 10/08/17 EF 60-65% Moderate AR  Mild MR Severe LAE  No complaints    Grandson is helping him   Received COVID vaccine in April   ***  Past Medical History:  Diagnosis Date  . BPH (benign prostatic hyperplasia)   . Glaucoma   . Hyperlipidemia   . Hypertension    Past Surgical History:  Procedure Laterality Date  . CARDIOVERSION N/A 11/13/2015   Procedure: CARDIOVERSION;  Surgeon: Wendall Stade, MD;  Location: [facility] ENDOSCOPY;  Service: Cardiovascular;  Laterality: N/A;  . EYE SURGERY     bilateral cataracts  . PILONIDAL CYST / SINUS EXCISION       No outpatient medications have been marked as taking for the 07/12/20 encounter (Appointment) with Wendall Stade, MD.     Allergies:   Patient has no known allergies.   Social History   Tobacco Use  . Smoking status: Former Smoker    Types: Pipe, Cigars    Quit date: 10/13/1962    Years since quitting: 57.7  . Smokeless tobacco: Never Used  Vaping Use  . Vaping Use: Never used  Substance Use Topics  . Alcohol use: Yes    Comment: 1 glass wine per night  . Drug use: No     Family Hx: The patient's family history includes Stroke (age of onset: 60) in his father.  ROS:   Please see the history of present illness.     All other systems reviewed and are negative.   Prior CV studies:   The following studies were reviewed today:  Echo 09/2018 EF 60-65% moderate AR mild MR severe LAE   Labs/Other Tests and Data Reviewed:     EKG:  Not done this visit   Recent Labs: No results found for requested labs within last 8760 hours.   Recent Lipid Panel Lab Results  Component Value Date/Time   CHOL 93 10/15/2015 02:14 AM   TRIG 27 10/15/2015 02:14 AM   HDL 53 10/15/2015 02:14 AM   CHOLHDL 1.8 10/15/2015 02:14 AM   LDLCALC 35 10/15/2015 02:14 AM    Wt Readings from Last 3 Encounters:  04/22/18 174 lb 12 oz (79.3 kg)  10/04/17 166 lb 4 oz (75.4 kg)  08/10/16 175 lb (79.4 kg)     Objective:    Vital Signs:  There were no vitals taken for this visit.   Affect appropriate Healthy:  appears stated age HEENT: normal Neck supple with no adenopathy JVP normal no bruits no thyromegaly Lungs clear with no wheezing and good diaphragmatic motion Heart:  S1/S2 AR  murmur, no rub, gallop or click PMI normal Abdomen: benighn, BS positve, no tenderness, no AAA no bruit.  No HSM or HJR Distal pulses intact with no bruits No edema Neuro non-focal Skin warm and dry No muscular weakness    ASSESSMENT & PLAN:    1. PAF - asymptomatic continue anticoagulation with eliquis  GFR has been > 60 and weight > 60 kg  some despite age on eliquis 5 bid   2. HTN: Well controlled.  Continue current medications and low sodium Dash type diet.   3. AR:  Asymptomatic and compensated LV size and function no need for SBE BP well Controlled follow and repeat echo for any new symptoms given age       Medication Adjustments/Labs and Tests Ordered: Current medicines are reviewed at length with the patient today.  Concerns regarding medicines are outlined above.   Tests Ordered:  None  Medication Changes:  None   Disposition:  Follow up in a year   Signed, Charlton Haws, MD  07/04/2020 3:57 PM    [facility]

## 2020-07-09 ENCOUNTER — Telehealth: Payer: Self-pay | Admitting: Cardiovascular Disease

## 2020-07-09 NOTE — Telephone Encounter (Signed)
Left message for patient to call back  

## 2020-07-09 NOTE — Telephone Encounter (Signed)
Pt's brother mentionned that he's scared to come out the house due to the pandemic and wants to know if he could do a virtual visit ovrthe thephone.

## 2020-07-11 NOTE — Telephone Encounter (Signed)
Called patient about his message. Rescheduled patient to one of Dr. Fabio Bering virtual days. Patient stated his BP has been good 135/79 HR 67 and 126/71 HR 79. Patient stated he has been riding his stationery bike every day. Patient stated he just would like to stay put until this pandemic has gone away. Patient stated he did get the vaccine. Patient was agreeable to new date and time and will need a reminder message left for him several days before his virtual visit.

## 2020-07-11 NOTE — Telephone Encounter (Signed)
Patient is returning call.  °

## 2020-07-12 ENCOUNTER — Ambulatory Visit: Payer: Medicare Other | Admitting: Cardiovascular Disease

## 2020-07-16 NOTE — Progress Notes (Signed)
Virtual Visit via Video Note   This visit type was conducted due to national recommendations for restrictions regarding the COVID-19 Pandemic (e.g. social distancing) in an effort to limit this patient's exposure and mitigate transmission in our community.  Due to her co-morbid illnesses, this patient is at least at moderate risk for complications without adequate follow up.  This format is felt to be most appropriate for this patient at this time.  All issues noted in this document were discussed and addressed.  A limited physical exam was performed with this format.  Please refer to the patient's chart for her consent to telehealth for [facility].   Patient Location: Home Physician Location: Office  Date:  07/29/2020   ID:  Barry Burgess, DOB 1926/10/22, MRN [id number]  PCP:  Catha Gosselin, MD  Cardiologist:   Eden Emms Electrophysiologist:  None   Evaluation Performed:  Follow-Up Visit  Chief Complaint:  Afib  History of Present Illness:    86 y.o. history of HLD, HTN, and PAF. CHA2DVASC 3 on eliquis for anticoagulation. Last [facility] 10/2015 Still watching "[employer] " and has several history degrees. 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   TTE 10/08/17 EF 60-65% Moderate AR  Mild MR Severe LAE  No complaints    Barry Burgess is helping him   Received COVID vaccine in April  Riding stationary bike daily Home BP readings good   Using a walker now No falls   Past Medical History:  Diagnosis Date  . BPH (benign prostatic hyperplasia)   . Glaucoma   . Hyperlipidemia   . Hypertension    Past Surgical History:  Procedure Laterality Date  . CARDIOVERSION N/A 11/13/2015   Procedure: CARDIOVERSION;  Surgeon: Wendall Stade, MD;  Location: [facility] ENDOSCOPY;  Service: Cardiovascular;  Laterality: N/A;  . EYE SURGERY     bilateral cataracts  . PILONIDAL CYST / SINUS EXCISION       Current Meds  Medication Sig  . diltiazem (CARDIZEM CD) 360 MG 24  hr capsule TAKE 1 CAPSULE BY MOUTH  DAILY.  Marland Kitchen dorzolamide-timolol (COSOPT) 22.3-6.8 MG/ML ophthalmic solution Place 1 drop into both eyes 2 (two) times daily.  Marland Kitchen doxazosin (CARDURA) 4 MG tablet Take 4 mg by mouth daily.  Marland Kitchen ELIQUIS 5 MG TABS tablet TAKE 1 TABLET BY MOUTH  TWICE DAILY  . finasteride (PROSCAR) 5 MG tablet Take 5 mg by mouth daily.  Marland Kitchen latanoprost (XALATAN) 0.005 % ophthalmic solution Place 1 drop into both eyes every morning.  Marland Kitchen losartan (COZAAR) 25 MG tablet TAKE 1 TABLET BY MOUTH  DAILY  . pravastatin (PRAVACHOL) 80 MG tablet Take 40 mg by mouth daily.      Allergies:   Patient has no known allergies.   Social History   Tobacco Use  . Smoking status: Former Smoker    Types: Pipe, Cigars    Quit date: 10/13/1962    Years since quitting: 57.8  . Smokeless tobacco: Never Used  Vaping Use  . Vaping Use: Never used  Substance Use Topics  . Alcohol use: Yes    Comment: 1 glass wine per night  . Drug use: No     Family Hx: The patient's family history includes Stroke (age of onset: 84) in his father.  ROS:   Please see the history of present illness.     All other systems reviewed and are negative.   Prior CV studies:   The following studies were reviewed today:  Echo 09/2018 EF 60-65% moderate AR mild MR severe LAE   Labs/Other Tests and Data Reviewed:    EKG:  Not done this visit   Recent Labs: No results found for requested labs within last 8760 hours.   Recent Lipid Panel Lab Results  Component Value Date/Time   CHOL 93 10/15/2015 02:14 AM   TRIG 27 10/15/2015 02:14 AM   HDL 53 10/15/2015 02:14 AM   CHOLHDL 1.8 10/15/2015 02:14 AM   LDLCALC 35 10/15/2015 02:14 AM    Wt Readings from Last 3 Encounters:  04/22/18 174 lb 12 oz (79.3 kg)  10/04/17 166 lb 4 oz (75.4 kg)  08/10/16 175 lb (79.4 kg)     Objective:    Vital Signs:  BP 121/75   Pulse 74    Telephone no exam     ASSESSMENT & PLAN:    1. PAF - asymptomatic continue  anticoagulation with eliquis  GFR has been > 60 and weight > 60 kg some despite age on eliquis 5 bid   2. HTN: Well controlled.  Continue current medications and low sodium Dash type diet.   3. AR:  Asymptomatic and compensated LV size and function no need for SBE BP well Controlled follow and repeat echo for any new symptoms given age       Medication Adjustments/Labs and Tests Ordered: Current medicines are reviewed at length with the patient today.  Concerns regarding medicines are outlined above.   Tests Ordered:  None  Medication Changes:  None   Disposition:  Follow up in a year    Time:  Spent reviewing chart , echo direct patient interview and composing note 20 minutes   Signed, Charlton Haws, MD  07/29/2020 9:22 AM    [facility]

## 2020-07-19 ENCOUNTER — Other Ambulatory Visit: Payer: Self-pay | Admitting: Cardiovascular Disease

## 2020-07-29 ENCOUNTER — Other Ambulatory Visit: Payer: Self-pay

## 2020-07-29 ENCOUNTER — Telehealth (INDEPENDENT_AMBULATORY_CARE_PROVIDER_SITE_OTHER): Payer: Medicare Other | Admitting: Cardiovascular Disease

## 2020-07-29 VITALS — BP 121/75 | HR 74

## 2020-07-29 DIAGNOSIS — I351 Nonrheumatic aortic (valve) insufficiency: Secondary | ICD-10-CM

## 2020-07-29 DIAGNOSIS — I480 Paroxysmal atrial fibrillation: Secondary | ICD-10-CM

## 2020-07-29 NOTE — Patient Instructions (Signed)
Medication Instructions:  *If you need a refill on your cardiac medications before your next appointment, please call your pharmacy*  Lab Work: If you have labs (blood work) drawn today and your tests are completely normal, you will receive your results only by: . MyChart Message (if you have MyChart) OR . A paper copy in the mail If you have any lab test that is abnormal or we need to change your treatment, we will call you to review the results.  Testing/Procedures: None ordered today.  Follow-Up: At [facility], you and your health needs are our priority.  As part of our continuing mission to provide you with exceptional heart care, we have created designated Provider Care Teams.  These Care Teams include your primary Cardiologist (physician) and Advanced Practice Providers (APPs -  Physician Assistants and Nurse Practitioners) who all work together to provide you with the care you need, when you need it.  We recommend signing up for the patient portal called "MyChart".  Sign up information is provided on this After Visit Summary.  MyChart is used to connect with patients for Virtual Visits (Telemedicine).  Patients are able to view lab/test results, encounter notes, upcoming appointments, etc.  Non-urgent messages can be sent to your provider as well.   To learn more about what you can do with MyChart, go to [email or web address].    Your next appointment:   3 month(s)  The format for your next appointment:   In Person  Provider:   You may see Dr. Nishan or one of the following Advanced Practice Providers on your designated Care Team:    Lori Gerhardt, NP  Laura Ingold, NP  Jill McDaniel, NP     

## 2020-08-26 ENCOUNTER — Other Ambulatory Visit: Payer: Self-pay | Admitting: Cardiovascular Disease

## 2020-10-23 NOTE — Progress Notes (Deleted)
Date:  10/23/2020   ID:  [name], DOB 28-Sep-1926, MRN [id number]  PCP:  Catha Gosselin, MD  Cardiologist:   Eden Emms Electrophysiologist:  None   Evaluation Performed:  Follow-Up Visit  Chief Complaint:  Afib  History of Present Illness:    86 y.o. y.o. history of HLD, HTN, and PAF. CHA2DVASC 3 on eliquis for anticoagulation. Last [facility] 10/2015 Still watching "[employer] " and has several history degrees. 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   TTE 10/08/17 EF 60-65% Moderate AR  Mild MR Severe LAE  No complaints    [name] is helping him   Received COVID vaccine in April  Riding stationary bike daily Home BP readings good   Using a walker now No falls   ***  Past Medical History:  Diagnosis Date  . BPH (benign prostatic hyperplasia)   . Glaucoma   . Hyperlipidemia   . Hypertension    Past Surgical History:  Procedure Laterality Date  . CARDIOVERSION N/A 11/13/2015   Procedure: CARDIOVERSION;  Surgeon: Wendall Stade, MD;  Location: [facility] ENDOSCOPY;  Service: Cardiovascular;  Laterality: N/A;  . EYE SURGERY     bilateral cataracts  . PILONIDAL CYST / SINUS EXCISION       No outpatient medications have been marked as taking for the 10/29/20 encounter (Appointment) with Wendall Stade, MD.     Allergies:   Patient has no known allergies.   Social History   Tobacco Use  . Smoking status: Former Smoker    Types: Pipe, Cigars    Quit date: 10/13/1962    Years since quitting: 58.0  . Smokeless tobacco: Never Used  Vaping Use  . Vaping Use: Never used  Substance Use Topics  . Alcohol use: Yes    Comment: 1 glass wine per night  . Drug use: No     Family Hx: The patient's family history includes Stroke (age of onset: 38) in his father.  ROS:   Please see the history of present illness.     All other systems reviewed and are negative.   Prior CV studies:   The following studies were reviewed today:  Echo  09/2018 EF 60-65% moderate AR mild MR severe LAE   Labs/Other Tests and Data Reviewed:    EKG:   ***  Recent Labs: No results found for requested labs within last 8760 hours.   Recent Lipid Panel Lab Results  Component Value Date/Time   CHOL 93 10/15/2015 02:14 AM   TRIG 27 10/15/2015 02:14 AM   HDL 53 10/15/2015 02:14 AM   CHOLHDL 1.8 10/15/2015 02:14 AM   LDLCALC 35 10/15/2015 02:14 AM    Wt Readings from Last 3 Encounters:  04/22/18 79.3 kg  10/04/17 75.4 kg  08/10/16 79.4 kg     Objective:    Vital Signs:  There were no vitals taken for this visit.   Affect appropriate Healthy:  appears stated age HEENT: normal Neck supple with no adenopathy JVP normal no bruits no thyromegaly Lungs clear with no wheezing and good diaphragmatic motion Heart:  S1/S2AR murmur, no rub, gallop or click PMI normal Abdomen: benighn, BS positve, no tenderness, no AAA no bruit.  No HSM or HJR Distal pulses intact with no bruits No edema Neuro non-focal Skin warm and dry No muscular weakness   ASSESSMENT & PLAN:    1. PAF - asymptomatic continue anticoagulation with eliquis  GFR has been > 60 and weight >  60 kg so  despite age on eliquis 5 bid   2. HTN: Well controlled.  Continue current medications and low sodium Dash type diet.   3. AR:  Asymptomatic and compensated LV size and function no need for SBE BP well Controlled follow and repeat echo for any new symptoms given age       Medication Adjustments/Labs and Tests Ordered: Current medicines are reviewed at length with the patient today.  Concerns regarding medicines are outlined above.   Tests Ordered:  None  Medication Changes:  None   Disposition:  Follow up in a year    Time:  Spent reviewing chart , echo direct patient interview and composing note 20 minutes   Signed, Charlton Haws, MD  10/23/2020 3:56 PM    [facility]

## 2020-10-29 ENCOUNTER — Ambulatory Visit: Payer: Medicare Other | Admitting: Cardiovascular Disease

## 2020-10-29 NOTE — Progress Notes (Signed)
Virtual Visit via Video Note   This visit type was conducted due to national recommendations for restrictions regarding the COVID-19 Pandemic (e.g. social distancing) in an effort to limit this patient's exposure and mitigate transmission in our community.  Due to her co-morbid illnesses, this patient is at least at moderate risk for complications without adequate follow up.  This format is felt to be most appropriate for this patient at this time.  All issues noted in this document were discussed and addressed.  A limited physical exam was performed with this format.  Please refer to the patient's chart for her consent to telehealth for [facility].   Physician Location : office Patient location : home  Date:  10/29/2020   ID:  NASARIO CZERNIAK, DOB Mar 20, 1926, MRN [id number]  PCP:  Catha Gosselin, MD  Cardiologist:   Eden Emms Electrophysiologist:  None   Evaluation Performed:  Follow-Up Visit  Chief Complaint:  Afib  History of Present Illness:    86 y.o. y.o. history of HLD, HTN, and PAF. CHA2DVASC 3 on eliquis for anticoagulation. Last [facility] 10/2015 Still watching "[employer] " and has several history degrees. 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   TTE 10/08/17 EF 60-65% Moderate AR  Mild MR Severe LAE  No complaints    [name] is helping him   Received COVID vaccine in April  Riding stationary bike daily Home BP readings good   Using a walker now No falls   Elated about moving in with son in [place]. No more stress leaving alone and sold his house Feels fantastic  Past Medical History:  Diagnosis Date  . BPH (benign prostatic hyperplasia)   . Glaucoma   . Hyperlipidemia   . Hypertension    Past Surgical History:  Procedure Laterality Date  . CARDIOVERSION N/A 11/13/2015   Procedure: CARDIOVERSION;  Surgeon: Wendall Stade, MD;  Location: [facility] ENDOSCOPY;  Service: Cardiovascular;  Laterality: N/A;  . EYE SURGERY     bilateral cataracts   . PILONIDAL CYST / SINUS EXCISION       No outpatient medications have been marked as taking for the 11/01/20 encounter (Appointment) with Wendall Stade, MD.     Allergies:   Patient has no known allergies.   Social History   Tobacco Use  . Smoking status: Former Smoker    Types: Pipe, Cigars    Quit date: 10/13/1962    Years since quitting: 58.0  . Smokeless tobacco: Never Used  Vaping Use  . Vaping Use: Never used  Substance Use Topics  . Alcohol use: Yes    Comment: 1 glass wine per night  . Drug use: No     Family Hx: The patient's family history includes Stroke (age of onset: 27) in his father.  ROS:   Please see the history of present illness.     All other systems reviewed and are negative.   Prior CV studies:   The following studies were reviewed today:  Echo 09/2018 EF 60-65% moderate AR mild MR severe LAE   Labs/Other Tests and Data Reviewed:    EKG:   afib rate 92 nonspecific ST changes 2018  Recent Labs: No results found for requested labs within last 8760 hours.   Recent Lipid Panel Lab Results  Component Value Date/Time   CHOL 93 10/15/2015 02:14 AM   TRIG 27 10/15/2015 02:14 AM   HDL 53 10/15/2015 02:14 AM   CHOLHDL 1.8 10/15/2015 02:14 AM  LDLCALC 35 10/15/2015 02:14 AM    Wt Readings from Last 3 Encounters:  04/22/18 79.3 kg  10/04/17 75.4 kg  08/10/16 79.4 kg     Objective:    Vital Signs:  There were no vitals taken for this visit.   No exam virtual visit   ASSESSMENT & PLAN:    1. PAF - asymptomatic continue anticoagulation with eliquis  GFR has been > 60 and weight > 60 kg so  despite age on eliquis 5 bid   2. HTN: Well controlled.  Continue current medications and low sodium Dash type diet.   3. AR:  Asymptomatic and compensated LV size and function no need for SBE BP well Controlled follow and repeat echo for any new symptoms given age       Medication Adjustments/Labs and Tests Ordered: Current medicines are  reviewed at length with the patient today.  Concerns regarding medicines are outlined above.   Tests Ordered:  None  Medication Changes:  None   Disposition:  Follow up in a year    Time:  Spent reviewing chart , echo direct patient interview and composing note 20 minutes   Signed, Charlton Haws, MD  10/29/2020 8:24 AM    [facility]

## 2020-11-01 ENCOUNTER — Other Ambulatory Visit: Payer: Self-pay | Admitting: Cardiovascular Disease

## 2020-11-01 ENCOUNTER — Telehealth (INDEPENDENT_AMBULATORY_CARE_PROVIDER_SITE_OTHER): Payer: Medicare Other | Admitting: Cardiovascular Disease

## 2020-11-01 ENCOUNTER — Other Ambulatory Visit: Payer: Self-pay

## 2020-11-01 ENCOUNTER — Telehealth: Payer: Self-pay | Admitting: Cardiovascular Disease

## 2020-11-01 VITALS — BP 120/65 | HR 69 | Ht 72.0 in | Wt 185.0 lb

## 2020-11-01 DIAGNOSIS — I4819 Other persistent atrial fibrillation: Secondary | ICD-10-CM

## 2020-11-01 NOTE — Telephone Encounter (Signed)
*  STAT* If patient is at the pharmacy, call can be transferred to refill team.   1. Which medications need to be refilled? (please list name of each medication and dose if known) need a new prescription for local pharmacy for Diltiazem  2. Which pharmacy/location (including street and city if local pharmacy) is medication to be sent to?* [facility]  At [address], [place]  3. Do they need a 30 day or 90 day supply? 30 days

## 2020-11-01 NOTE — Patient Instructions (Addendum)

## 2020-11-04 ENCOUNTER — Other Ambulatory Visit: Payer: Self-pay | Admitting: Cardiovascular Disease

## 2020-11-04 NOTE — Telephone Encounter (Signed)
Follow up:     This patient needed emergency refill for 10 days for the local drug store. Patient is completley out. The rest rest was to go to the mail order

## 2020-11-04 NOTE — Telephone Encounter (Signed)
*  STAT* If patient is at the pharmacy, call can be transferred to refill team.   1. Which medications need to be refilled? (please list name of each medication and dose if known) Diltiazem   2. Which pharmacy/location (including street and city if local pharmacy) is medication to be sent to? [facility]  3. Do they need a 30 day or 90 day supply? Patient needs about 10 till mail order comes. Patient is out of this medication as of this morning. Please call patient son when it is complete [phone] patient son number

## 2020-11-05 MED ORDER — DILTIAZEM HCL ER COATED BEADS 360 MG PO CP24
360.0000 mg | ORAL_CAPSULE | Freq: Every day | ORAL | 0 refills | Status: DC
Start: 2020-11-05 — End: 2021-10-01

## 2020-11-05 NOTE — Telephone Encounter (Signed)
*  STAT* If patient is at the pharmacy, call can be transferred to refill team.   1. Which medications need to be refilled? (please list name of each medication and dose if known)  diltiazem (CARDIZEM CD) 360 MG 24 hr capsule  2. Which pharmacy/location (including street and city if local pharmacy) is medication to be sent to? [facility] [address] [place], [place] [address]  3. Do they need a 30 day or 90 day supply? 10 day supply

## 2020-11-05 NOTE — Telephone Encounter (Signed)
Called patient to let him know medication was already sent to local [facility] pharmacy in [place] and we are unable to reorder since it was done so recently (earlier this morning). Patient will go to [facility] in [place] to pick up medication.

## 2020-11-05 NOTE — Telephone Encounter (Signed)
Pt's medication was sent to [facility] for a 15 day supply until mail order arrives. Confirmation received.

## 2020-11-08 ENCOUNTER — Other Ambulatory Visit: Payer: Self-pay | Admitting: Cardiovascular Disease

## 2020-12-13 ENCOUNTER — Other Ambulatory Visit: Payer: Self-pay | Admitting: Cardiovascular Disease

## 2021-02-09 DIAGNOSIS — I4891 Unspecified atrial fibrillation: Secondary | ICD-10-CM | POA: Diagnosis not present

## 2021-02-09 DIAGNOSIS — H4089 Other specified glaucoma: Secondary | ICD-10-CM | POA: Diagnosis not present

## 2021-02-09 DIAGNOSIS — E782 Mixed hyperlipidemia: Secondary | ICD-10-CM | POA: Diagnosis not present

## 2021-02-09 DIAGNOSIS — E1169 Type 2 diabetes mellitus with other specified complication: Secondary | ICD-10-CM | POA: Diagnosis not present

## 2021-02-09 DIAGNOSIS — I10 Essential (primary) hypertension: Secondary | ICD-10-CM | POA: Diagnosis not present

## 2021-03-19 NOTE — Progress Notes (Signed)
Virtual Visit via Video Note   This visit type was conducted due to national recommendations for restrictions regarding the COVID-19 Pandemic (e.g. social distancing) in an effort to limit this patient's exposure and mitigate transmission in our community.  Due to her co-morbid illnesses, this patient is at least at moderate risk for complications without adequate follow up.  This format is felt to be most appropriate for this patient at this time.  All issues noted in this document were discussed and addressed.  A limited physical exam was performed with this format.  Please refer to the patient's chart for her consent to telehealth for [facility].   Physician Location : office Patient location : home  Date:  03/26/2021   ID:  Barry Burgess, DOB 06-Feb-1926, MRN [id number]  PCP:  Catha Gosselin, MD  Cardiologist:   Eden Emms Electrophysiologist:  None   Evaluation Performed:  Follow-Up Visit  Chief Complaint:  Afib  History of Present Illness:    87 y.o. y.o. history of HLD, HTN, and PAF. CHA2DVASC 3 on eliquis for anticoagulation. Last [facility] 10/2015 Still watching "[employer] " and has several history degrees. 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   TTE 10/08/17 EF 60-65% Moderate AR  Mild MR Severe LAE  No complaints    Barry Burgess is helping him   Received COVID vaccine in April  Riding stationary bike daily Home BP readings good   Using a walker now No falls   Elated about moving in with son in [place]. No more stress leaving alone and sold his house Feels fantastic  Past Medical History:  Diagnosis Date  . BPH (benign prostatic hyperplasia)   . Glaucoma   . Hyperlipidemia   . Hypertension    Past Surgical History:  Procedure Laterality Date  . CARDIOVERSION N/A 11/13/2015   Procedure: CARDIOVERSION;  Surgeon: Wendall Stade, MD;  Location: [facility] ENDOSCOPY;  Service: Cardiovascular;  Laterality: N/A;  . EYE SURGERY     bilateral cataracts   . PILONIDAL CYST / SINUS EXCISION       Current Meds  Medication Sig  . diltiazem (CARDIZEM CD) 360 MG 24 hr capsule Take 1 capsule (360 mg total) by mouth daily.  . dorzolamide-timolol (COSOPT) 22.3-6.8 MG/ML ophthalmic solution Place 1 drop into both eyes 2 (two) times daily.  Marland Kitchen doxazosin (CARDURA) 4 MG tablet Take 4 mg by mouth daily.  Marland Kitchen ELIQUIS 5 MG TABS tablet TAKE 1 TABLET BY MOUTH  TWICE DAILY  . finasteride (PROSCAR) 5 MG tablet Take 5 mg by mouth daily.  Marland Kitchen latanoprost (XALATAN) 0.005 % ophthalmic solution Place 1 drop into both eyes every morning.  Marland Kitchen losartan (COZAAR) 25 MG tablet TAKE 1 TABLET BY MOUTH  DAILY  . pravastatin (PRAVACHOL) 80 MG tablet Take 40 mg by mouth daily.      Allergies:   Patient has no known allergies.   Social History   Tobacco Use  . Smoking status: Former Smoker    Types: Pipe, Cigars    Quit date: 10/13/1962    Years since quitting: 58.4  . Smokeless tobacco: Never Used  Vaping Use  . Vaping Use: Never used  Substance Use Topics  . Alcohol use: Yes    Comment: 1 glass wine per night  . Drug use: No     Family Hx: The patient's family history includes Stroke (age of onset: 27) in his father.  ROS:   Please see the history of present  illness.     All other systems reviewed and are negative.   Prior CV studies:   The following studies were reviewed today:  Echo 09/2018 EF 60-65% moderate AR mild MR severe LAE   Labs/Other Tests and Data Reviewed:    EKG:   afib rate 92 nonspecific ST changes 2018  Recent Labs: No results found for requested labs within last 8760 hours.   Recent Lipid Panel Lab Results  Component Value Date/Time   CHOL 93 10/15/2015 02:14 AM   TRIG 27 10/15/2015 02:14 AM   HDL 53 10/15/2015 02:14 AM   CHOLHDL 1.8 10/15/2015 02:14 AM   LDLCALC 35 10/15/2015 02:14 AM    Wt Readings from Last 3 Encounters:  03/26/21 81.6 kg  11/01/20 83.9 kg  04/22/18 79.3 kg     Objective:    Vital Signs:  BP  128/77   Pulse 85   Ht 6' (1.829 m)   Wt 81.6 kg   BMI 24.41 kg/m    No exam virtual visit   ASSESSMENT & PLAN:    1. PAF - asymptomatic continue anticoagulation with eliquis  GFR has been > 60 and weight > 60 kg so  despite age on eliquis 5 bid   2. HTN: Well controlled.  Continue current medications and low sodium Dash type diet.   3. AR:  Asymptomatic and compensated LV size and function no need for SBE BP well Controlled follow and repeat echo for any new symptoms given age       Medication Adjustments/Labs and Tests Ordered: Current medicines are reviewed at length with the patient today.  Concerns regarding medicines are outlined above.   Tests Ordered:  None  Medication Changes:  None   Disposition:  Follow up in a year    Time:  Spent reviewing chart , echo direct patient interview and composing note 20 minutes   Signed, Charlton Haws, MD  03/26/2021 9:59 AM    [facility]

## 2021-03-26 ENCOUNTER — Other Ambulatory Visit: Payer: Self-pay

## 2021-03-26 ENCOUNTER — Telehealth (INDEPENDENT_AMBULATORY_CARE_PROVIDER_SITE_OTHER): Payer: Medicare Other | Admitting: Cardiovascular Disease

## 2021-03-26 VITALS — BP 128/77 | HR 85 | Ht 72.0 in | Wt 180.0 lb

## 2021-03-26 DIAGNOSIS — I480 Paroxysmal atrial fibrillation: Secondary | ICD-10-CM | POA: Diagnosis not present

## 2021-03-26 NOTE — Patient Instructions (Signed)

## 2021-07-16 ENCOUNTER — Other Ambulatory Visit: Payer: Self-pay | Admitting: Cardiovascular Disease

## 2021-07-17 NOTE — Telephone Encounter (Signed)
Prescription refill request for Eliquis received. Indication: afib  Last office visit: nishan, 03/26/2021 Scr: 1.10, 10/09/2020 Age: 87 yo  Weight: 81.6 kg   Pt on the correct dose of Eliquis per dosing criteria, prescription refill sent for Eliquis 5mg  BID.

## 2021-09-30 ENCOUNTER — Other Ambulatory Visit: Payer: Self-pay | Admitting: Cardiovascular Disease

## 2021-10-13 ENCOUNTER — Other Ambulatory Visit: Payer: Self-pay | Admitting: Cardiovascular Disease

## 2022-02-22 ENCOUNTER — Other Ambulatory Visit: Payer: Self-pay | Admitting: Cardiovascular Disease

## 2022-02-22 DIAGNOSIS — I4891 Unspecified atrial fibrillation: Secondary | ICD-10-CM

## 2022-02-23 NOTE — Telephone Encounter (Signed)
Eliquis 5mg  refill request received. Patient is 88 years old, weight-81.6kg, Crea-1.19 on 11/27/2021 via [facility] from [facility], [place], and last seen by Dr. [place] via Telemedicine on 03/26/2021 & has a recall pending. Dose is appropriate based on dosing criteria. Will send in refill to requested pharmacy.   ?

## 2022-04-02 ENCOUNTER — Other Ambulatory Visit: Payer: Self-pay | Admitting: Cardiovascular Disease

## 2022-05-08 ENCOUNTER — Telehealth: Payer: Self-pay | Admitting: Cardiovascular Disease

## 2022-05-08 NOTE — Telephone Encounter (Signed)
Patient's son returning call. 

## 2022-05-08 NOTE — Telephone Encounter (Signed)
Left message for patients son to call back.

## 2022-05-08 NOTE — Telephone Encounter (Signed)
Spoke with the patient's son who states that the patient has been prescribed lasix 40 mg daily. Advised that he needs to monitor his BP and for any signs of dehydration. Watch for any lightheadedness or dizziness.

## 2022-05-08 NOTE — Telephone Encounter (Signed)
Pt c/o medication issue:  1. Name of Medication:   losartan (COZAAR) 25 MG tablet    2. How are you currently taking this medication (dosage and times per day)? Take 1 tablet (25 mg total) by mouth daily. Please make overdue appt with Dr. [name] before anymore refills. Thank you 2nd Attempt  3. Are you having a reaction (difficulty breathing--STAT)? No  4. What is your medication issue? Pt's son wants to know if medication is okay to take with Lasix medication that was prescribed by another provider. Pleaser advise

## 2022-05-10 ENCOUNTER — Encounter: Payer: Self-pay | Admitting: Physician Assistant

## 2022-05-10 NOTE — Progress Notes (Unsigned)
Cardiology Office Note    Date:  05/10/2022   ID:  Avaneesh, Pepitone 04-30-1926, MRN [id number]  PCP:  Catha Gosselin, MD  Cardiologist:  Charlton Haws, MD  Electrophysiologist:  None   Chief Complaint: ***  History of Present Illness:   Barry Burgess is a 88 y.o. male with history of HTN, HLD, PAF s/p remote DCCV, DM, moderate AI, mild MR, mild TR, BPH who is seen for follow-up. This is his first return to the office since 2019 as subsequent follow-ups have been via telemedicine. Last echo 2018 EF 60-65%, mild LVH, moderate AI, mild MR, mild TR, severe LAE, mild RAE.  Valve sx only DM Eliquis dose   Paroxysmal atrial fibrillation Essential HTN Hyperlipidemia p Moderate AI, mild MR/TR  Labwork independently reviewed: [facility] *** 2019 scan A1C 6.6, Hgb 13.9, plt 160, Na 141, K 4.7, Cr 1.07, LFTs/TSH wnl, LDL 49, trig 64   Cardiology Studies:   Studies reviewed are outlined and summarized above. Reports included below if pertinent.   2d echo 2018 - Left ventricle: The cavity size was normal. There was mild    concentric hypertrophy. Systolic function was normal. The    estimated ejection fraction was in the range of 60% to 65%. Wall    motion was normal; there were no regional wall motion    abnormalities.  - Aortic valve: Transvalvular velocity was within the normal range.    There was no stenosis. There was moderate regurgitation.  - Mitral valve: Transvalvular velocity was within the normal range.    There was no evidence for stenosis. There was mild regurgitation.  - Left atrium: The atrium was severely dilated.  - Right ventricle: The cavity size was normal. Wall thickness was    normal. Systolic function was normal.  - Right atrium: The atrium was mildly dilated.  - Tricuspid valve: There was mild regurgitation.  - Pulmonary arteries: Systolic pressure was within the normal    range. PA peak pressure: 26 mm Hg (S).     Past Medical History:  Diagnosis  Date   BPH (benign prostatic hyperplasia)    Glaucoma    Hyperlipidemia    Hypertension     Past Surgical History:  Procedure Laterality Date   CARDIOVERSION N/A 11/13/2015   Procedure: CARDIOVERSION;  Surgeon: Wendall Stade, MD;  Location: [facility] ENDOSCOPY;  Service: Cardiovascular;  Laterality: N/A;   EYE SURGERY     bilateral cataracts   PILONIDAL CYST / SINUS EXCISION      Current Medications: No outpatient medications have been marked as taking for the 05/12/22 encounter (Appointment) with Laurann Montana, PA-C.   ***   Allergies:   Patient has no known allergies.   Social History   Socioeconomic History   Marital status: Married    Spouse name: Not on file   Number of children: Not on file   Years of education: Not on file   Highest education level: Not on file  Occupational History   Not on file  Tobacco Use   Smoking status: Former    Types: Pipe, Cigars    Quit date: 10/13/1962    Years since quitting: 59.6   Smokeless tobacco: Never  Vaping Use   Vaping Use: Never used  Substance and Sexual Activity   Alcohol use: Yes    Comment: 1 glass wine per night   Drug use: No   Sexual activity: Not on file  Other Topics Concern   Not on  file  Social History Narrative   Not on file   Social Determinants of Health   Financial Resource Strain: Not on file  Food Insecurity: Not on file  Transportation Needs: Not on file  Physical Activity: Not on file  Stress: Not on file  Social Connections: Not on file     Family History:  The patient's ***family history includes Stroke (age of onset: 58) in his father.  ROS:   Please see the history of present illness. Otherwise, review of systems is positive for ***.  All other systems are reviewed and otherwise negative.    EKG(s)/Additional Labs   EKG:  EKG is ordered today, personally reviewed, demonstrating ***  Recent Labs: No results found for requested labs within last 8760 hours.  Recent Lipid Panel     Component Value Date/Time   CHOL 93 10/15/2015 0214   TRIG 27 10/15/2015 0214   HDL 53 10/15/2015 0214   CHOLHDL 1.8 10/15/2015 0214   VLDL 5 10/15/2015 0214   LDLCALC 35 10/15/2015 0214    PHYSICAL EXAM:    VS:  There were no vitals taken for this visit.  BMI: There is no height or weight on file to calculate BMI.  GEN: Well nourished, well developed male in no acute distress HEENT: normocephalic, atraumatic Neck: no JVD, carotid bruits, or masses Cardiac: ***RRR; no murmurs, rubs, or gallops, no edema  Respiratory:  clear to auscultation bilaterally, normal work of breathing GI: soft, nontender, nondistended, + BS MS: no deformity or atrophy Skin: warm and dry, no rash Neuro:  Alert and Oriented x 3, Strength and sensation are intact, follows commands Psych: euthymic mood, full affect  Wt Readings from Last 3 Encounters:  03/26/21 180 lb (81.6 kg)  11/01/20 185 lb (83.9 kg)  04/22/18 174 lb 12 oz (79.3 kg)     ASSESSMENT & PLAN:   ***     Disposition: F/u with ***   Medication Adjustments/Labs and Tests Ordered: Current medicines are reviewed at length with the patient today.  Concerns regarding medicines are outlined above. Medication changes, Labs and Tests ordered today are summarized above and listed in the Patient Instructions accessible in Encounters.   Signed, Laurann Montana, PA-C  05/10/2022 8:44 AM    [facility] Phone: [phone]; Fax: [phone]

## 2022-05-11 ENCOUNTER — Other Ambulatory Visit: Payer: Self-pay

## 2022-05-11 ENCOUNTER — Emergency Department (HOSPITAL_COMMUNITY): Payer: Medicare Other

## 2022-05-11 ENCOUNTER — Inpatient Hospital Stay (HOSPITAL_COMMUNITY)
Admission: EM | Admit: 2022-05-11 | Discharge: 2022-05-15 | DRG: 291 | Disposition: A | Discharge status: Home Health | Payer: Medicare Other | Attending: Internal Medicine | Admitting: Internal Medicine

## 2022-05-11 ENCOUNTER — Encounter (HOSPITAL_COMMUNITY): Payer: Self-pay | Admitting: Internal Medicine

## 2022-05-11 DIAGNOSIS — Z79899 Other long term (current) drug therapy: Secondary | ICD-10-CM

## 2022-05-11 DIAGNOSIS — I509 Heart failure, unspecified: Secondary | ICD-10-CM | POA: Diagnosis not present

## 2022-05-11 DIAGNOSIS — I083 Combined rheumatic disorders of mitral, aortic and tricuspid valves: Secondary | ICD-10-CM | POA: Diagnosis present

## 2022-05-11 DIAGNOSIS — I480 Paroxysmal atrial fibrillation: Secondary | ICD-10-CM | POA: Insufficient documentation

## 2022-05-11 DIAGNOSIS — E785 Hyperlipidemia, unspecified: Secondary | ICD-10-CM | POA: Diagnosis present

## 2022-05-11 DIAGNOSIS — I4891 Unspecified atrial fibrillation: Secondary | ICD-10-CM | POA: Diagnosis not present

## 2022-05-11 DIAGNOSIS — I4819 Other persistent atrial fibrillation: Secondary | ICD-10-CM | POA: Insufficient documentation

## 2022-05-11 DIAGNOSIS — I5033 Acute on chronic diastolic (congestive) heart failure: Secondary | ICD-10-CM | POA: Diagnosis present

## 2022-05-11 DIAGNOSIS — R001 Bradycardia, unspecified: Secondary | ICD-10-CM

## 2022-05-11 DIAGNOSIS — N400 Enlarged prostate without lower urinary tract symptoms: Secondary | ICD-10-CM | POA: Diagnosis present

## 2022-05-11 DIAGNOSIS — H409 Unspecified glaucoma: Secondary | ICD-10-CM | POA: Diagnosis present

## 2022-05-11 DIAGNOSIS — I130 Hypertensive heart and chronic kidney disease with heart failure and stage 1 through stage 4 chronic kidney disease, or unspecified chronic kidney disease: Principal | ICD-10-CM | POA: Diagnosis present

## 2022-05-11 DIAGNOSIS — N179 Acute kidney failure, unspecified: Secondary | ICD-10-CM

## 2022-05-11 DIAGNOSIS — R21 Rash and other nonspecific skin eruption: Secondary | ICD-10-CM | POA: Diagnosis present

## 2022-05-11 DIAGNOSIS — I4811 Longstanding persistent atrial fibrillation: Secondary | ICD-10-CM | POA: Diagnosis present

## 2022-05-11 DIAGNOSIS — R0902 Hypoxemia: Secondary | ICD-10-CM

## 2022-05-11 DIAGNOSIS — N1831 Chronic kidney disease, stage 3a: Secondary | ICD-10-CM | POA: Diagnosis present

## 2022-05-11 DIAGNOSIS — Z7901 Long term (current) use of anticoagulants: Secondary | ICD-10-CM

## 2022-05-11 DIAGNOSIS — Z87891 Personal history of nicotine dependence: Secondary | ICD-10-CM

## 2022-05-11 DIAGNOSIS — J810 Acute pulmonary edema: Secondary | ICD-10-CM | POA: Diagnosis not present

## 2022-05-11 DIAGNOSIS — I10 Essential (primary) hypertension: Secondary | ICD-10-CM

## 2022-05-11 DIAGNOSIS — N5089 Other specified disorders of the male genital organs: Secondary | ICD-10-CM | POA: Diagnosis present

## 2022-05-11 DIAGNOSIS — R600 Localized edema: Secondary | ICD-10-CM

## 2022-05-11 LAB — URINALYSIS, ROUTINE W REFLEX MICROSCOPIC
Bilirubin Urine: NEGATIVE
Glucose, UA: NEGATIVE mg/dL
Hgb urine dipstick: NEGATIVE
Ketones, ur: NEGATIVE mg/dL
Leukocytes,Ua: NEGATIVE
Nitrite: NEGATIVE
Protein, ur: NEGATIVE mg/dL
Specific Gravity, Urine: 1.005 (ref 1.005–1.030)
pH: 5 (ref 5.0–8.0)

## 2022-05-11 LAB — CBC WITH DIFFERENTIAL/PLATELET
Abs Immature Granulocytes: 0.02 10*3/uL (ref 0.00–0.07)
Basophils Absolute: 0 10*3/uL (ref 0.0–0.1)
Basophils Relative: 0 %
Eosinophils Absolute: 0 10*3/uL (ref 0.0–0.5)
Eosinophils Relative: 1 %
HCT: 39.9 % (ref 39.0–52.0)
Hemoglobin: 12.5 g/dL — ABNORMAL LOW (ref 13.0–17.0)
Immature Granulocytes: 0 %
Lymphocytes Relative: 14 %
Lymphs Abs: 0.6 10*3/uL — ABNORMAL LOW (ref 0.7–4.0)
MCH: 31 pg (ref 26.0–34.0)
MCHC: 31.3 g/dL (ref 30.0–36.0)
MCV: 99 fL (ref 80.0–100.0)
Monocytes Absolute: 0.3 10*3/uL (ref 0.1–1.0)
Monocytes Relative: 8 %
Neutro Abs: 3.4 10*3/uL (ref 1.7–7.7)
Neutrophils Relative %: 77 %
Platelets: 164 10*3/uL (ref 150–400)
RBC: 4.03 MIL/uL — ABNORMAL LOW (ref 4.22–5.81)
RDW: 15.1 % (ref 11.5–15.5)
WBC: 4.5 10*3/uL (ref 4.0–10.5)
nRBC: 0 % (ref 0.0–0.2)

## 2022-05-11 LAB — BASIC METABOLIC PANEL
Anion gap: 12 (ref 5–15)
BUN: 18 mg/dL (ref 8–23)
CO2: 25 mmol/L (ref 22–32)
Calcium: 8.8 mg/dL — ABNORMAL LOW (ref 8.9–10.3)
Chloride: 104 mmol/L (ref 98–111)
Creatinine, Ser: 1.35 mg/dL — ABNORMAL HIGH (ref 0.61–1.24)
GFR, Estimated: 49 mL/min — ABNORMAL LOW (ref >60)
Glucose, Bld: 178 mg/dL — ABNORMAL HIGH (ref 70–99)
Potassium: 4 mmol/L (ref 3.5–5.1)
Sodium: 141 mmol/L (ref 135–145)

## 2022-05-11 LAB — MAGNESIUM: Magnesium: 1.8 mg/dL (ref 1.7–2.4)

## 2022-05-11 MED ORDER — ACETAMINOPHEN 325 MG PO TABS
650.0000 mg | ORAL_TABLET | Freq: Four times a day (QID) | ORAL | Status: DC | PRN
  Administered 2022-05-15: 650 mg via ORAL
  Filled 2022-05-11: qty 2

## 2022-05-11 MED ORDER — FINASTERIDE 5 MG PO TABS
5.0000 mg | ORAL_TABLET | Freq: Every day | ORAL | Status: DC
  Administered 2022-05-12 – 2022-05-15 (×4): 5 mg via ORAL
  Filled 2022-05-11 (×4): qty 1

## 2022-05-11 MED ORDER — APIXABAN 5 MG PO TABS
5.0000 mg | ORAL_TABLET | Freq: Two times a day (BID) | ORAL | Status: DC
  Administered 2022-05-11 – 2022-05-15 (×8): 5 mg via ORAL
  Filled 2022-05-11 (×8): qty 1

## 2022-05-11 MED ORDER — POLYETHYLENE GLYCOL 3350 17 G PO PACK: 17.0000 g | PACK | Freq: Every day | ORAL | Status: DC | PRN

## 2022-05-11 MED ORDER — SODIUM CHLORIDE 0.9% FLUSH
3.0000 mL | Freq: Two times a day (BID) | INTRAVENOUS | Status: DC
  Administered 2022-05-11 – 2022-05-14 (×6): 3 mL via INTRAVENOUS

## 2022-05-11 MED ORDER — FUROSEMIDE 10 MG/ML IJ SOLN
60.0000 mg | Freq: Once | INTRAMUSCULAR | Status: AC
  Administered 2022-05-11: 60 mg via INTRAVENOUS
  Filled 2022-05-11: qty 6

## 2022-05-11 MED ORDER — LATANOPROST 0.005 % OP SOLN
1.0000 [drp] | Freq: Every morning | OPHTHALMIC | Status: DC
  Administered 2022-05-12 – 2022-05-15 (×4): 1 [drp] via OPHTHALMIC
  Filled 2022-05-11: qty 2.5

## 2022-05-11 MED ORDER — PRAVASTATIN SODIUM 40 MG PO TABS
40.0000 mg | ORAL_TABLET | Freq: Every day | ORAL | Status: DC
  Administered 2022-05-12 – 2022-05-14 (×3): 40 mg via ORAL
  Filled 2022-05-11 (×3): qty 1

## 2022-05-11 MED ORDER — ACETAMINOPHEN 650 MG RE SUPP: 650.0000 mg | Freq: Four times a day (QID) | RECTAL | Status: DC | PRN

## 2022-05-11 MED ORDER — FUROSEMIDE 10 MG/ML IJ SOLN
60.0000 mg | Freq: Two times a day (BID) | INTRAMUSCULAR | Status: DC
  Administered 2022-05-12 – 2022-05-14 (×6): 60 mg via INTRAVENOUS
  Filled 2022-05-11 (×7): qty 6

## 2022-05-11 MED ORDER — DORZOLAMIDE HCL-TIMOLOL MAL 2-0.5 % OP SOLN
1.0000 [drp] | Freq: Two times a day (BID) | OPHTHALMIC | Status: DC
  Administered 2022-05-11 – 2022-05-15 (×7): 1 [drp] via OPHTHALMIC
  Filled 2022-05-11: qty 10

## 2022-05-11 NOTE — ED Triage Notes (Signed)
Patient coming from home, complaint of leg swelling and shortness of breath. Upon triage pt hr in 30s. A&Ox4.

## 2022-05-11 NOTE — ED Notes (Signed)
EDP at bedside  

## 2022-05-11 NOTE — ED Notes (Signed)
Pt stated he was unable to give urine sample at this time

## 2022-05-11 NOTE — Consult Note (Signed)
Cardiology Consultation:   Patient ID: Barry Burgess MRN: [id number]; DOB: 1926-12-18  Admit date: 05/11/2022 Date of Consult: 05/11/2022  PCP:  Hulan Fess, MD   [facility] Providers Cardiologist:  Jenkins Rouge, MD        Patient Profile:   Barry Burgess is a 88 y.o. male with a hx of longstanding persistent atrial fibrillation who is being seen 05/11/2022 for the evaluation of heart failure at the request of Dr. Reather Converse.  History of Present Illness:   Mr. Barry Burgess 88 year old patient of Dr. [name] with hypertension hyperlipidemia atrial fibrillation with CHA2DS2-VASc score of 3 on Eliquis with prior cardioversion in 2016, here with worsening leg edema, weakness over the last several days.  He states that he is not really having any shortness of breath although there is pulmonary congestion on x-ray.  Currently he is laying fairly comfortably in the emergency department.  Has a jovial laugh at times.  Mainly, he has just been feeling weak.  No syncope.  No bleeding.  No fevers.  No chest pain.  On May 19 a phone call was made to our office.  Patient's son stated that the Lasix had been prescribed 40 mg daily.  He had been on losartan 25 mg a day also.  Another provider had prescribed him Lasix for lower extremity edema.  He lives with his son in [place].  Utilizes a walker.  He is from [place].     Past Medical History:  Diagnosis Date   Aortic insufficiency    BPH (benign prostatic hyperplasia)    Glaucoma    Hyperlipidemia    Hypertension    Mitral regurgitation    PAF (paroxysmal atrial fibrillation) (HCC)    Tricuspid regurgitation     Past Surgical History:  Procedure Laterality Date   CARDIOVERSION N/A 11/13/2015   Procedure: CARDIOVERSION;  Surgeon: Josue Hector, MD;  Location: [place];  Service: Cardiovascular;  Laterality: N/A;   EYE SURGERY     bilateral cataracts   PILONIDAL CYST / SINUS EXCISION       Home Medications:  Prior to  Admission medications   Medication Sig Start Date End Date Taking? Authorizing Provider  apixaban (ELIQUIS) 5 MG TABS tablet TAKE 1 TABLET BY MOUTH  TWICE DAILY 02/23/22   Josue Hector, MD  diltiazem (CARDIZEM CD) 360 MG 24 hr capsule TAKE 1 CAPSULE BY MOUTH  DAILY. 10/01/21   Josue Hector, MD  dorzolamide-timolol (COSOPT) 22.3-6.8 MG/ML ophthalmic solution Place 1 drop into both eyes 2 (two) times daily. 10/07/15   [provider]  doxazosin (CARDURA) 4 MG tablet Take 4 mg by mouth daily. 10/11/15   [provider]  finasteride (PROSCAR) 5 MG tablet Take 5 mg by mouth daily. 08/14/15   [provider]  furosemide (LASIX) 40 MG tablet Take 40 mg by mouth daily. 05/07/22   [provider]  latanoprost (XALATAN) 0.005 % ophthalmic solution Place 1 drop into both eyes every morning. 10/07/15   [provider]  losartan (COZAAR) 25 MG tablet Take 1 tablet (25 mg total) by mouth daily. Please make overdue appt with Dr. [name] before anymore refills. Thank you 2nd Attempt 04/02/22   Josue Hector, MD  pravastatin (PRAVACHOL) 80 MG tablet Take 40 mg by mouth daily.  11/25/15   [provider]    Inpatient Medications: Scheduled Meds:  furosemide  60 mg Intravenous Once   Continuous Infusions:  PRN Meds:   Allergies:  No Known Allergies  Social History:   Social History   Socioeconomic History   Marital status: Married    Spouse name: Not on file   Number of children: Not on file   Years of education: Not on file   Highest education level: Not on file  Occupational History   Not on file  Tobacco Use   Smoking status: Former    Types: Pipe, Cigars    Quit date: 10/13/1962    Years since quitting: 59.6   Smokeless tobacco: Never  Vaping Use   Vaping Use: Never used  Substance and Sexual Activity   Alcohol use: Yes    Comment: 1 glass wine per night   Drug use: No   Sexual activity: Not on file  Other Topics Concern   Not  on file  Social History Narrative   Not on file   Social Determinants of Health   Financial Resource Strain: Not on file  Food Insecurity: Not on file  Transportation Needs: Not on file  Physical Activity: Not on file  Stress: Not on file  Social Connections: Not on file  Intimate Partner Violence: Not on file    Family History:    Family History  Problem Relation Age of Onset   Stroke Father 21     ROS:  Please see the history of present illness.  No fevers chills nausea vomiting syncope bleeding All other ROS reviewed and negative.     Physical Exam/Data:   Vitals:   05/11/22 1629 05/11/22 1630 05/11/22 1645 05/11/22 1715  BP:  (!) 109/57 (!) 112/50 (!) 118/57  Pulse:  (!) 51 (!) 42 (!) 42  Resp:  17  17  Temp: (!) 95.3 F (35.2 C)     TempSrc: Tympanic     SpO2:  94% 92% 93%   No intake or output data in the 24 hours ending 05/11/22 1730    03/26/2021    8:24 AM 11/01/2020    7:45 AM 04/22/2018    7:45 AM  Last 3 Weights  Weight (lbs) 180 lb 185 lb 174 lb 12 oz  Weight (kg) 81.647 kg 83.915 kg 79.266 kg     There is no height or weight on file to calculate BMI.  General:  Well nourished, well developed, in no acute distress, elderly HEENT: normal Neck: no JVD Vascular: No carotid bruits; Distal pulses 2+ bilaterally Cardiac:  normal S1, S2; irregularly irregular bradycardic; soft diastolic murmur  Lungs: Diminished breath sounds at bases Abd: soft, nontender, no hepatomegaly  Ext: 4+ bilateral lower extremity edema, mild excoriation noted right pretibial region Musculoskeletal:  No deformities, BUE and BLE strength normal and equal Skin: warm and dry  Neuro:  CNs 2-12 intact, no focal abnormalities noted Psych:  Normal affect   EKG:  The EKG was personally reviewed and demonstrates: Atrial fibrillation heart rate in the 30s.  Prior EKG showed atrial fibrillation heart rate of 92 bpm Telemetry:  Telemetry was personally reviewed and demonstrates: Atrial  fibrillation  Relevant CV Studies:  Echocardiogram 10/08/2017:  - Left ventricle: The cavity size was normal. There was mild    concentric hypertrophy. Systolic function was normal. The    estimated ejection fraction was in the range of 60% to 65%. Wall    motion was normal; there were no regional wall motion    abnormalities.  - Aortic valve: Transvalvular velocity was within the normal range.    There was no stenosis. There was moderate regurgitation.  -  Mitral valve: Transvalvular velocity was within the normal range.    There was no evidence for stenosis. There was mild regurgitation.  - Left atrium: The atrium was severely dilated.  - Right ventricle: The cavity size was normal. Wall thickness was    normal. Systolic function was normal.  - Right atrium: The atrium was mildly dilated.  - Tricuspid valve: There was mild regurgitation.  - Pulmonary arteries: Systolic pressure was within the normal    range. PA peak pressure: 26 mm Hg (S).   Laboratory Data:  High Sensitivity Troponin:  No results for input(s): TROPONINIHS in the last 720 hours.   Chemistry Recent Labs  Lab 05/11/22 1617  NA 141  K 4.0  CL 104  CO2 25  GLUCOSE 178*  BUN 18  CREATININE 1.35*  CALCIUM 8.8*  MG 1.8  GFRNONAA 49*  ANIONGAP 12    No results for input(s): PROT, ALBUMIN, AST, ALT, ALKPHOS, BILITOT in the last 168 hours. Lipids No results for input(s): CHOL, TRIG, HDL, LABVLDL, LDLCALC, CHOLHDL in the last 168 hours.  Hematology Recent Labs  Lab 05/11/22 1617  WBC 4.5  RBC 4.03*  HGB 12.5*  HCT 39.9  MCV 99.0  MCH 31.0  MCHC 31.3  RDW 15.1  PLT 164   Thyroid No results for input(s): TSH, FREET4 in the last 168 hours.  BNPNo results for input(s): BNP, PROBNP in the last 168 hours.  DDimer No results for input(s): DDIMER in the last 168 hours.   Radiology/Studies:  DG Chest Portable 1 View  Result Date: 05/11/2022 CLINICAL DATA:  Shortness of breath and leg swelling. EXAM:  PORTABLE CHEST 1 VIEW COMPARISON:  10/14/2015 FINDINGS: Cardiomegaly and pulmonary vascular congestion noted. Mild interstitial opacities are present. Bilateral LOWER lung opacities are noted and may represent atelectasis/consolidation/effusions are present. No pneumothorax or acute bony abnormality. IMPRESSION: 1. Cardiomegaly with pulmonary vascular congestion and probable mild interstitial edema. 2. Bilateral LOWER lung atelectasis/consolidation/effusions. Electronically Signed   By: Margarette Canada M.D.   On: 05/11/2022 16:51     Assessment and Plan:   88 year old with longstanding persistent atrial fibrillation here with worsening weakness, lower extremity edema, acute diastolic heart failure, bradycardia  Atrial fibrillation/bradycardia - We will hold his diltiazem 360 mg once a day.  Heart rate in the 30s-40s on original EKG here personally viewed.  Continues this way on telemetry.  Prior echocardiogram showed normal EF with moderate AR and mild MR with severe left atrial enlargement. -This may be contributing to some of his weakness if his bradycardia is persistent at home.  Acute on chronic diastolic heart failure -Agree with IV Lasix 60 mg.  Leg edema is quite significant.  Would continue this twice daily.  Lets shoot for 1 to 2 L out daily.  Pulmonary edema noted on chest x-ray personally reviewed.  He was prescribed 40 mg at home daily by his primary care physician.  Aortic regurgitation - Previously described as moderate in 2018.  We will repeat echocardiogram.  Essential hypertension - Continue to monitor.  Currently normotensive.  Chronic anticoagulation - Continue with Eliquis 5 mg twice a day.  Risk Assessment/Risk Scores:       New York Heart Association (NYHA) Functional Class NYHA Class III  CHA2DS2-VASc Score = 4   This indicates a 4.8% annual risk of stroke. The patient's score is based upon: CHF History: 1 HTN History: 1 Diabetes History: 0 Stroke History:  0 Vascular Disease History: 0 Age Score: 2 Gender Score: 0  For questions or updates, please contact [place] Please consult [email or web address] for contact info under    Signed, Candee Furbish, MD  05/11/2022 5:30 PM

## 2022-05-11 NOTE — H&P (Signed)
History and Physical   Barry Burgess [id number] DOB: 1926/10/22 DOA: 05/11/2022  PCP: Hulan Fess, MD   Patient coming from: Home  Chief Complaint:  SOB, Edema  HPI: Barry Burgess is a 88 y.o. male with medical history significant of hyperlipidemia, hypertension, atrial fibrillation, BPH presenting with shortness of breath and edema.  Patient reports 1 week of increasing shortness of breath and 6 months of lower extremity edema that has progressed in the past several weeks and now include scrotal edema..  Also reporting fatigue and generalized weakness.  Denies fevers, chills, chest pain, abdominal pain, constipation, diarrhea, nausea, vomiting.  ED Course: Vital signs in the ED significant for temperature of 95.3, heart rate in the 30s to 40s, blood pressure in the [id number] to 0000000 systolic, needing 0 to 1 L to maintain saturations.  Lab work-up included BMP with creatinine elevated to 1.35 from baseline of 1, glucose 178, calcium 8.6.  CBC with hemoglobin stable at 12.5.  TSH and BMP pending.  Urinalysis pending.  Chest x-ray with cardiomegaly and pulmonary vascular congestion as well as atelectasis versus effusion versus consolidation at bilateral lung bases.  Patient received dose of IV Lasix in the ED.  Cardiology was consulted and recommending holding nodal blocking agents and treating with diuretics for CHF and obtain echo.  Review of Systems: As per HPI otherwise all other systems reviewed and are negative.  Past Medical History:  Diagnosis Date   Aortic insufficiency    Atrial fibrillation with RVR ([place]) 10/14/2015   BPH (benign prostatic hyperplasia)    Glaucoma    Hyperlipidemia    Hypertension    Mitral regurgitation    PAF (paroxysmal atrial fibrillation) (HCC)    Tricuspid regurgitation     Past Surgical History:  Procedure Laterality Date   CARDIOVERSION N/A 11/13/2015   Procedure: CARDIOVERSION;  Surgeon: Josue Hector, MD;  Location: [facility] ENDOSCOPY;   Service: Cardiovascular;  Laterality: N/A;   EYE SURGERY     bilateral cataracts   PILONIDAL CYST / SINUS EXCISION      Social History  reports that he quit smoking about 59 years ago. His smoking use included pipe and cigars. He has never used smokeless tobacco. He reports current alcohol use. He reports that he does not use drugs.  No Known Allergies  Family History  Problem Relation Age of Onset   Stroke Father 45  Reviewed on admission  Prior to Admission medications   Medication Sig Start Date End Date Taking? Authorizing Provider  apixaban (ELIQUIS) 5 MG TABS tablet TAKE 1 TABLET BY MOUTH  TWICE DAILY 02/23/22   Josue Hector, MD  diltiazem (CARDIZEM CD) 360 MG 24 hr capsule TAKE 1 CAPSULE BY MOUTH  DAILY. 10/01/21   Josue Hector, MD  dorzolamide-timolol (COSOPT) 22.3-6.8 MG/ML ophthalmic solution Place 1 drop into both eyes 2 (two) times daily. 10/07/15   [provider]  doxazosin (CARDURA) 4 MG tablet Take 4 mg by mouth daily. 10/11/15   [provider]  finasteride (PROSCAR) 5 MG tablet Take 5 mg by mouth daily. 08/14/15   [provider]  furosemide (LASIX) 40 MG tablet Take 40 mg by mouth daily. 05/07/22   [provider]  latanoprost (XALATAN) 0.005 % ophthalmic solution Place 1 drop into both eyes every morning. 10/07/15   [provider]  losartan (COZAAR) 25 MG tablet Take 1 tablet (25 mg total) by mouth daily. Please make overdue appt with Dr. [name] before anymore refills. Thank  you 2nd Attempt 04/02/22   Josue Hector, MD  pravastatin (PRAVACHOL) 80 MG tablet Take 40 mg by mouth daily.  11/25/15   [provider]    Physical Exam: Vitals:   05/11/22 1800 05/11/22 1815 05/11/22 1830 05/11/22 1845  BP: (!) 116/51 105/72 (!) 116/58 121/62  Pulse: (!) 120 (!) 38 (!) 42 (!) 42  Resp: 17 16 16 18   Temp:      TempSrc:      SpO2: 94% 95% 95% 94%    Physical Exam Constitutional:      General: He is not in acute  distress.    Appearance: Normal appearance.  HENT:     Head: Normocephalic and atraumatic.     Mouth/Throat:     Mouth: Mucous membranes are moist.     Pharynx: Oropharynx is clear.  Eyes:     Extraocular Movements: Extraocular movements intact.     Pupils: Pupils are equal, round, and reactive to light.  Cardiovascular:     Rate and Rhythm: Bradycardia present. Rhythm irregular.     Pulses: Normal pulses.     Heart sounds: Normal heart sounds.  Pulmonary:     Effort: Pulmonary effort is normal. No respiratory distress.     Breath sounds: Rales present.  Abdominal:     General: Bowel sounds are normal. There is no distension.     Palpations: Abdomen is soft.     Tenderness: There is no abdominal tenderness.  Musculoskeletal:        General: No swelling or deformity.     Right lower leg: Edema present.     Left lower leg: Edema present.  Skin:    General: Skin is warm and dry.     Comments: Venous stasis changes bilateral lower extremities  Neurological:     General: No focal deficit present.     Mental Status: Mental status is at baseline.   Labs on Admission: I have personally reviewed following labs and imaging studies  CBC: Recent Labs  Lab 05/11/22 1617  WBC 4.5  NEUTROABS 3.4  HGB 12.5*  HCT 39.9  MCV 99.0  PLT 123456    Basic Metabolic Panel: Recent Labs  Lab 05/11/22 1617  NA 141  K 4.0  CL 104  CO2 25  GLUCOSE 178*  BUN 18  CREATININE 1.35*  CALCIUM 8.8*  MG 1.8    GFR: CrCl cannot be calculated (Unknown ideal weight.).  Liver Function Tests: No results for input(s): AST, ALT, ALKPHOS, BILITOT, PROT, ALBUMIN in the last 168 hours.  Urine analysis: No results found for: COLORURINE, APPEARANCEUR, [place], [place], GLUCOSEU, HGBUR, BILIRUBINUR, KETONESUR, PROTEINUR, UROBILINOGEN, NITRITE, LEUKOCYTESUR  Radiological Exams on Admission: DG Chest Portable 1 View  Result Date: 05/11/2022 CLINICAL DATA:  Shortness of breath and leg swelling. EXAM:  PORTABLE CHEST 1 VIEW COMPARISON:  10/14/2015 FINDINGS: Cardiomegaly and pulmonary vascular congestion noted. Mild interstitial opacities are present. Bilateral LOWER lung opacities are noted and may represent atelectasis/consolidation/effusions are present. No pneumothorax or acute bony abnormality. IMPRESSION: 1. Cardiomegaly with pulmonary vascular congestion and probable mild interstitial edema. 2. Bilateral LOWER lung atelectasis/consolidation/effusions. Electronically Signed   By: Margarette Canada M.D.   On: 05/11/2022 16:51    EKG: Independently reviewed.  Atrial fibrillation with slow ventricular response at 38 bpm with low voltage in multiple leads.  Assessment/Plan Principal Problem:   CHF exacerbation (HCC) Active Problems:   HTN (hypertension)   HLD (hyperlipidemia)   BPH (benign prostatic hyperplasia)   Atrial fibrillation  with slow ventricular response (HCC)   CHF exacerbation > Acute CHF in the setting of A-fib with slow ventricular response/bradycardia as below. > No prior diagnosis of this per chart review.  Last echo was in 2018 and showed EF of 60-65%, normal RV function, normal wall motion of the LV. > Given IV Lasix in the ED.  Magnesium level normal in the ED. - Appreciate cardiology recommendations - Monitor on telemetry/progressive - Continue with Lasix 60 mg IV twice daily - Strict I's and O's, daily weights - Echocardiogram - Trend renal function and electrolytes.  A-fib with SVR > Patient noted to have bradycardia in the ED in the 30s to 40s.  Remaining hemodynamically stable for now with blood pressure in the 100s to 110s. > Seen by cardiology who recommend holding nodal blocking agents for now - Continue to monitor on progressive unit in case any symptomatic or hemodynamically significant bradycardia develops, will speak with cardiology if this occurs. - Appreciate cardiology recommendations - Hold diltiazem - Continue home Eliquis  AKI > Creatinine elevated  1.35 from baseline of 1.  This is in the setting of acute CHF as above. - Continue with diuresis as above - Trend renal function and electrolytes  Hypertension > Holding home losartan and diltiazem and Lasix - Receiving IV Lasix as above - Holding diltiazem for low normal blood pressure and bradycardia - Holding losartan for low normal blood pressure and AKI  Hyperlipidemia - Continue home pravastatin  BPH - Continue home finasteride  Hx of Cataracts Glaucoma - Continue home eyedrops  DVT prophylaxis: Eliquis Code Status:   Full for now, he is having difficulty deciding on if he would want to change.  Would recommend reassessing tomorrow. Family Communication:  Son, contacted by phone, but there was no answer.  Family was reportedly in the ED waiting room earlier and updated by EDP and patient states they have been in his room but had left. Disposition Plan:   Patient is from:  Home  Anticipated DC to:  Home  Anticipated DC date:  1 to 3 days  Anticipated DC barriers: None  Consults called:  Cardiology, consulted in the ED and are following Admission status:  Observation, progressive  Severity of Illness: The appropriate patient status for this patient is OBSERVATION. Observation status is judged to be reasonable and necessary in order to provide the required intensity of service to ensure the patient's safety. The patient's presenting symptoms, physical exam findings, and initial radiographic and laboratory data in the context of their medical condition is felt to place them at decreased risk for further clinical deterioration. Furthermore, it is anticipated that the patient will be medically stable for discharge from the hospital within 2 midnights of admission.    Marcelyn Bruins MD [facility]  How to contact the [facility] Attending or Consulting provider [place] or covering provider during after hours [place], for this patient?   Check the care team in [facility] and look for  a) attending/consulting [facility] provider listed and b) the [facility] team listed Log into [email or web address] and use 's universal password to access. If you do not have the password, please contact the hospital operator. Locate the [facility] provider you are looking for under [facility] and page to a number that you can be directly reached. If you still have difficulty reaching the provider, please page the [facility] (Director on Call) for the Hospitalists listed on amion for assistance.  05/11/2022, 7:04 PM

## 2022-05-11 NOTE — ED Provider Notes (Signed)
[facility] EMERGENCY DEPARTMENT Provider Note   CSN: [id number] Arrival date & time: 05/11/22  1533     History  Chief Complaint  Patient presents with   Weakness   Bradycardia    Barry Burgess is a 88 y.o. male.  Patient presents with worsening leg edema and shortness of breath over the past week.  In addition patient's had general fatigue and general weakness.  Patient follows with cardiology Dr. Janeice Robinson who he said is aware patient in the emergency room.  Patient denies any fevers, chest pain, abdominal pain.  Patient to take medications as needed and has had increased Lasix recently without significant improvement.  Patient is on Eliquis for chronic A-fib.       Home Medications Prior to Admission medications   Medication Sig Start Date End Date Taking? Authorizing Provider  apixaban (ELIQUIS) 5 MG TABS tablet TAKE 1 TABLET BY MOUTH  TWICE DAILY 02/23/22   Josue Hector, MD  diltiazem (CARDIZEM CD) 360 MG 24 hr capsule TAKE 1 CAPSULE BY MOUTH  DAILY. 10/01/21   Josue Hector, MD  dorzolamide-timolol (COSOPT) 22.3-6.8 MG/ML ophthalmic solution Place 1 drop into both eyes 2 (two) times daily. 10/07/15   [provider]  doxazosin (CARDURA) 4 MG tablet Take 4 mg by mouth daily. 10/11/15   [provider]  finasteride (PROSCAR) 5 MG tablet Take 5 mg by mouth daily. 08/14/15   [provider]  furosemide (LASIX) 40 MG tablet Take 40 mg by mouth daily. 05/07/22   [provider]  latanoprost (XALATAN) 0.005 % ophthalmic solution Place 1 drop into both eyes every morning. 10/07/15   [provider]  losartan (COZAAR) 25 MG tablet Take 1 tablet (25 mg total) by mouth daily. Please make overdue appt with Dr. [name] before anymore refills. Thank you 2nd Attempt 04/02/22   Josue Hector, MD  pravastatin (PRAVACHOL) 80 MG tablet Take 40 mg by mouth daily.  11/25/15   [provider]      Allergies    Patient has  no known allergies.    Review of Systems   Review of Systems  Constitutional:  Positive for fatigue. Negative for chills and fever.  HENT:  Negative for congestion.   Eyes:  Negative for visual disturbance.  Respiratory:  Positive for shortness of breath.   Cardiovascular:  Negative for chest pain.  Gastrointestinal:  Negative for abdominal pain and vomiting.  Genitourinary:  Negative for dysuria and flank pain.  Musculoskeletal:  Negative for back pain, neck pain and neck stiffness.  Skin:  Negative for rash.  Neurological:  Positive for weakness. Negative for light-headedness and headaches.   Physical Exam Updated Vital Signs BP (!) 112/50   Pulse (!) 42   Temp (!) 95.3 F (35.2 C) (Tympanic) Comment: hard time getting a oral temp  Resp 17   SpO2 92%  Physical Exam Vitals and nursing note reviewed.  Constitutional:      General: He is not in acute distress.    Appearance: He is well-developed.  HENT:     Head: Normocephalic and atraumatic.     Mouth/Throat:     Mouth: Mucous membranes are moist.  Eyes:     General:        Right eye: No discharge.        Left eye: No discharge.     Conjunctiva/sclera: Conjunctivae normal.  Neck:     Trachea: No tracheal deviation.  Cardiovascular:  Rate and Rhythm: Regular rhythm. Bradycardia present.  Pulmonary:     Effort: Pulmonary effort is normal.     Comments: Patient has decreased air entry bilateral lower lung and midlung fields, few crackles worse on the left. Abdominal:     General: There is no distension.     Palpations: Abdomen is soft.     Tenderness: There is no abdominal tenderness. There is no guarding.  Musculoskeletal:        General: Swelling present.     Cervical back: Normal range of motion and neck supple. No rigidity.  Skin:    General: Skin is warm and dry.     Capillary Refill: Capillary refill takes less than 2 seconds.     Findings: Rash present.     Comments: Patient has significant edema  bilateral lower extremity with chronic skin changes, superficial wound open on the right approximately 2 cm, mild warmth bilateral lower extremities.  Dry skin.  Neurological:     General: No focal deficit present.     Mental Status: He is alert.     Cranial Nerves: No cranial nerve deficit.  Psychiatric:        Mood and Affect: Mood normal.    ED Results / Procedures / Treatments   Labs (all labs ordered are listed, but only abnormal results are displayed) Labs Reviewed  BASIC METABOLIC PANEL - Abnormal; Notable for the following components:      Result Value   Glucose, Bld 178 (*)    Creatinine, Ser 1.35 (*)    Calcium 8.8 (*)    GFR, Estimated 49 (*)    All other components within normal limits  CBC WITH DIFFERENTIAL/PLATELET - Abnormal; Notable for the following components:   RBC 4.03 (*)    Hemoglobin 12.5 (*)    Lymphs Abs 0.6 (*)    All other components within normal limits  MAGNESIUM  TSH  URINALYSIS, ROUTINE W REFLEX MICROSCOPIC  BRAIN NATRIURETIC PEPTIDE    EKG EKG Interpretation  Date/Time:  Monday May 11 2022 15:46:49 EDT Ventricular Rate:  38 PR Interval:  210 QRS Duration: 99 QT Interval:  540 QTC Calculation: 430 R Axis:   131 Text Interpretation: Sinus bradycardia Atrial premature complexes in couplets Low voltage, precordial leads Probable right ventricular hypertrophy Confirmed by Elnora Morrison [phone]) on 05/11/2022 4:08:13 PM  Radiology DG Chest Portable 1 View  Result Date: 05/11/2022 CLINICAL DATA:  Shortness of breath and leg swelling. EXAM: PORTABLE CHEST 1 VIEW COMPARISON:  10/14/2015 FINDINGS: Cardiomegaly and pulmonary vascular congestion noted. Mild interstitial opacities are present. Bilateral LOWER lung opacities are noted and may represent atelectasis/consolidation/effusions are present. No pneumothorax or acute bony abnormality. IMPRESSION: 1. Cardiomegaly with pulmonary vascular congestion and probable mild interstitial edema. 2. Bilateral  LOWER lung atelectasis/consolidation/effusions. Electronically Signed   By: Margarette Canada M.D.   On: 05/11/2022 16:51    Procedures .Critical Care Performed by: Elnora Morrison, MD Authorized by: Elnora Morrison, MD   Critical care provider statement:    Critical care time (minutes):  30   Critical care start time:  05/11/2022 4:30 PM   Critical care end time:  05/11/2022 5:00 PM   Critical care time was exclusive of:  Separately billable procedures and treating other patients and teaching time   Critical care was necessary to treat or prevent imminent or life-threatening deterioration of the following conditions:  Cardiac failure   Critical care was time spent personally by me on the following activities:  Discussions with  consultants, evaluation of patient's response to treatment, examination of patient, ordering and review of radiographic studies, ordering and review of laboratory studies, ordering and performing treatments and interventions, review of old charts, re-evaluation of patient's condition and pulse oximetry    Medications Ordered in ED Medications  furosemide (LASIX) injection 60 mg (has no administration in time range)    ED Course/ Medical Decision Making/ A&P                           Medical Decision Making Amount and/or Complexity of Data Reviewed Labs: ordered. Radiology: ordered.  Risk Prescription drug management.   Patient presents with clinical concern for pulmonary edema/bilateral leg edema in addition to symptomatic bradycardia.  Patient placed on cardiac monitor heart rate in the 40s in the room reviewed, sinus.  Patient's blood work ordered and reviewed independently showing creatinine 1.35 compared to 0.9 previous blood work illustrating acute kidney disease, hemoglobin 12.5, glucose 178, electrolytes unremarkable.  Last echo reviewed from 2018 in October ejection fraction normal 60% and no significant aortic stenosis.  Paged cardiology as patient will need  admission for pulm edema, hypoxia requiring 1 L nasal cannula, worsening conditioning and clinical status despite outpatient follow-up.  Patient initial temperature is 95 degrees and requested nursing to rectal temperature for accuracy as that was tympanic.  Blood pressure stable around 0000000 systolic.  Chest x-ray consistent with physical exam with bilateral pleural effusions, BNP pending.  IV Lasix ordered for the ER to help with fluid overload.  Discussed case with Dr. Marlou Porch agreed with admission to medicine and they will follow in consult.  Agreed with Lasix IV, holding diltiazem dose tomorrow and monitoring on telemetry.        Final Clinical Impression(s) / ED Diagnoses Final diagnoses:  Sinus bradycardia  Bilateral leg edema  Hypoxia  Acute pulmonary edema (HCC)  Acute renal failure, unspecified acute renal failure type [facility])    Rx / DC Orders ED Discharge Orders     None         Elnora Morrison, MD 05/11/22 1722

## 2022-05-12 ENCOUNTER — Other Ambulatory Visit: Payer: Self-pay

## 2022-05-12 ENCOUNTER — Inpatient Hospital Stay (HOSPITAL_COMMUNITY): Payer: Medicare Other

## 2022-05-12 ENCOUNTER — Ambulatory Visit: Payer: Medicare Other | Admitting: Physician Assistant

## 2022-05-12 ENCOUNTER — Encounter (HOSPITAL_COMMUNITY): Payer: Self-pay | Admitting: Internal Medicine

## 2022-05-12 DIAGNOSIS — I071 Rheumatic tricuspid insufficiency: Secondary | ICD-10-CM

## 2022-05-12 DIAGNOSIS — I351 Nonrheumatic aortic (valve) insufficiency: Secondary | ICD-10-CM

## 2022-05-12 DIAGNOSIS — I340 Nonrheumatic mitral (valve) insufficiency: Secondary | ICD-10-CM

## 2022-05-12 DIAGNOSIS — Z79899 Other long term (current) drug therapy: Secondary | ICD-10-CM | POA: Diagnosis not present

## 2022-05-12 DIAGNOSIS — I5033 Acute on chronic diastolic (congestive) heart failure: Secondary | ICD-10-CM

## 2022-05-12 DIAGNOSIS — I4811 Longstanding persistent atrial fibrillation: Secondary | ICD-10-CM | POA: Diagnosis present

## 2022-05-12 DIAGNOSIS — E785 Hyperlipidemia, unspecified: Secondary | ICD-10-CM | POA: Diagnosis present

## 2022-05-12 DIAGNOSIS — J810 Acute pulmonary edema: Secondary | ICD-10-CM | POA: Diagnosis present

## 2022-05-12 DIAGNOSIS — R21 Rash and other nonspecific skin eruption: Secondary | ICD-10-CM | POA: Diagnosis present

## 2022-05-12 DIAGNOSIS — I4891 Unspecified atrial fibrillation: Secondary | ICD-10-CM | POA: Diagnosis not present

## 2022-05-12 DIAGNOSIS — I083 Combined rheumatic disorders of mitral, aortic and tricuspid valves: Secondary | ICD-10-CM | POA: Diagnosis present

## 2022-05-12 DIAGNOSIS — N5089 Other specified disorders of the male genital organs: Secondary | ICD-10-CM | POA: Diagnosis present

## 2022-05-12 DIAGNOSIS — N179 Acute kidney failure, unspecified: Secondary | ICD-10-CM | POA: Diagnosis present

## 2022-05-12 DIAGNOSIS — Z87891 Personal history of nicotine dependence: Secondary | ICD-10-CM | POA: Diagnosis not present

## 2022-05-12 DIAGNOSIS — I480 Paroxysmal atrial fibrillation: Secondary | ICD-10-CM

## 2022-05-12 DIAGNOSIS — R0902 Hypoxemia: Secondary | ICD-10-CM | POA: Diagnosis present

## 2022-05-12 DIAGNOSIS — Z7901 Long term (current) use of anticoagulants: Secondary | ICD-10-CM | POA: Diagnosis not present

## 2022-05-12 DIAGNOSIS — R001 Bradycardia, unspecified: Secondary | ICD-10-CM | POA: Diagnosis present

## 2022-05-12 DIAGNOSIS — I10 Essential (primary) hypertension: Secondary | ICD-10-CM

## 2022-05-12 DIAGNOSIS — N400 Enlarged prostate without lower urinary tract symptoms: Secondary | ICD-10-CM | POA: Diagnosis present

## 2022-05-12 DIAGNOSIS — N1831 Chronic kidney disease, stage 3a: Secondary | ICD-10-CM | POA: Diagnosis present

## 2022-05-12 DIAGNOSIS — I130 Hypertensive heart and chronic kidney disease with heart failure and stage 1 through stage 4 chronic kidney disease, or unspecified chronic kidney disease: Secondary | ICD-10-CM | POA: Diagnosis present

## 2022-05-12 DIAGNOSIS — H409 Unspecified glaucoma: Secondary | ICD-10-CM | POA: Diagnosis present

## 2022-05-12 LAB — ECHOCARDIOGRAM COMPLETE
AR max vel: 2.02 cm2
AV Area VTI: 2.17 cm2
AV Area mean vel: 1.94 cm2
AV Mean grad: 2 mmHg
AV Peak grad: 5.3 mmHg
Ao pk vel: 1.15 m/s
Area-P 1/2: 4.89 cm2
P 1/2 time: 1164 msec
S' Lateral: 3.3 cm
Single Plane A4C EF: 31.7 %
Weight: 3440 oz

## 2022-05-12 LAB — COMPREHENSIVE METABOLIC PANEL
ALT: 20 U/L (ref 0–44)
AST: 23 U/L (ref 15–41)
Albumin: 3.3 g/dL — ABNORMAL LOW (ref 3.5–5.0)
Alkaline Phosphatase: 69 U/L (ref 38–126)
Anion gap: 5 (ref 5–15)
BUN: 18 mg/dL (ref 8–23)
CO2: 32 mmol/L (ref 22–32)
Calcium: 8.9 mg/dL (ref 8.9–10.3)
Chloride: 108 mmol/L (ref 98–111)
Creatinine, Ser: 1.35 mg/dL — ABNORMAL HIGH (ref 0.61–1.24)
GFR, Estimated: 49 mL/min — ABNORMAL LOW (ref >60)
Glucose, Bld: 126 mg/dL — ABNORMAL HIGH (ref 70–99)
Potassium: 4.2 mmol/L (ref 3.5–5.1)
Sodium: 145 mmol/L (ref 135–145)
Total Bilirubin: 0.9 mg/dL (ref 0.3–1.2)
Total Protein: 6.1 g/dL — ABNORMAL LOW (ref 6.5–8.1)

## 2022-05-12 LAB — CBC
HCT: 37.8 % — ABNORMAL LOW (ref 39.0–52.0)
Hemoglobin: 12.4 g/dL — ABNORMAL LOW (ref 13.0–17.0)
MCH: 31.6 pg (ref 26.0–34.0)
MCHC: 32.8 g/dL (ref 30.0–36.0)
MCV: 96.2 fL (ref 80.0–100.0)
Platelets: 161 10*3/uL (ref 150–400)
RBC: 3.93 MIL/uL — ABNORMAL LOW (ref 4.22–5.81)
RDW: 15 % (ref 11.5–15.5)
WBC: 5.8 10*3/uL (ref 4.0–10.5)
nRBC: 0 % (ref 0.0–0.2)

## 2022-05-12 NOTE — ED Notes (Signed)
Pt called out for assistance with urinating, pt stated he was not able to use the condom catheter that was in place. This RN assisted pt with using urinal.

## 2022-05-12 NOTE — ED Notes (Signed)
Barry Burgess son [phone]

## 2022-05-12 NOTE — ED Notes (Signed)
DO Vann notified of vitals

## 2022-05-12 NOTE — ED Notes (Signed)
MD notified of pt temp and heart rate. Pt remains alert and oriented x 4. Asymptomatic at this time. Took meds and ate breakfast. HR maintains at 30-45 bpm. Waiting for new orders. Will continue to monitor. Charge nurse notified and pt was moved from yellow zone room 43 to room 27 in [facility]. Report given to [facility] Care transferred.

## 2022-05-12 NOTE — Plan of Care (Signed)
  Problem: Education: Goal: Ability to demonstrate management of disease process will improve Outcome: Progressing Goal: Ability to verbalize understanding of medication therapies will improve Outcome: Progressing Goal: Individualized Educational Video(s) Outcome: Progressing   Problem: Activity: Goal: Capacity to carry out activities will improve Outcome: Progressing   Problem: Cardiac: Goal: Ability to achieve and maintain adequate cardiopulmonary perfusion will improve Outcome: Progressing   Problem: Education: Goal: Ability to demonstrate management of disease process will improve Outcome: Progressing Goal: Ability to verbalize understanding of medication therapies will improve Outcome: Progressing Goal: Individualized Educational Video(s) Outcome: Progressing   Problem: Activity: Goal: Capacity to carry out activities will improve Outcome: Progressing   Problem: Cardiac: Goal: Ability to achieve and maintain adequate cardiopulmonary perfusion will improve Outcome: Progressing   

## 2022-05-12 NOTE — Progress Notes (Signed)
Received patient from ED via stretcher, patient is alert and oriented x 4.  Patient's son and daughter-in-law at bedside.  Hooked up to monitor, oriented to room and unit routine.  Both with skin discoloration (red-brown) noted with wound/open areas on each shin, right shin with 2x2 cm open wound, pink, no drainage; left shin with 2x1.5 cm, pale pink, no drainage noted.  VSS, needs addressed.

## 2022-05-12 NOTE — ED Notes (Addendum)
Per admitting the parameters to notify the physician is to only notify if the pt's bradycardia is symptomatic. Admitting notified that pt's maintaining HR in the 30's when previously maintaining in the 40's

## 2022-05-12 NOTE — ED Notes (Signed)
Complete linen change, pt turned to R side and 1 pillow placed under.

## 2022-05-12 NOTE — ED Notes (Signed)
Breakfast order placed ?

## 2022-05-12 NOTE — ED Notes (Signed)
Per bed placement, pt rejected bed due to pt possibly needing higher level of care per floor rn. Awaiting response

## 2022-05-12 NOTE — ED Notes (Signed)
Lunch order placed

## 2022-05-12 NOTE — ED Notes (Addendum)
Admitting MD messaged and cardiology paged  regarding the 10 second bradycardia period when the pt's HR dropped to 34. Pt was asymptomatic and alert and oriented x4.

## 2022-05-12 NOTE — ED Notes (Signed)
Pt's HR now dipping to the 20's, Cardiology paged and notified.

## 2022-05-12 NOTE — Progress Notes (Signed)
Progress Note  Patient Name: YOANDY PARR Date of Encounter: 05/12/2022  [facility] Cardiologist: Jenkins Rouge, MD   Subjective   Laying comfortably in bed.  No chest pain.  No significant shortness of breath.  Marked lower extremity edema.  Inpatient Medications    Scheduled Meds:  apixaban  5 mg Oral BID   dorzolamide-timolol  1 drop Both Eyes BID   finasteride  5 mg Oral Daily   furosemide  60 mg Intravenous BID   latanoprost  1 drop Both Eyes q morning   pravastatin  40 mg Oral QHS   sodium chloride flush  3 mL Intravenous Q12H   Continuous Infusions:  PRN Meds: acetaminophen **OR** acetaminophen, polyethylene glycol   Vital Signs    Vitals:   05/12/22 0505 05/12/22 0700 05/12/22 0745 05/12/22 0830  BP: 110/64 124/62 106/65 117/62  Pulse: (!) 46 (!) 38 (!) 39 (!) 33  Resp: 20 16 (!) 22 15  Temp:      TempSrc:      SpO2: 96% 93% 95% 95%  Weight:        Intake/Output Summary (Last 24 hours) at 05/12/2022 0902 Last data filed at 05/12/2022 0158 Gross per 24 hour  Intake --  Output 950 ml  Net -950 ml      05/11/2022    8:00 PM 03/26/2021    8:24 AM 11/01/2020    7:45 AM  Last 3 Weights  Weight (lbs) 215 lb 180 lb 185 lb  Weight (kg) 97.523 kg 81.647 kg 83.915 kg      Telemetry    Bradycardia at times into the low 30s.  When talking with me, he was in the 34s.  Atrial fibrillation.- Personally Reviewed  ECG    No new- Personally Reviewed  Physical Exam   GEN: No acute distress.   Neck: No JVD Cardiac: Irregularly irregular bradycardic, soft diastolic murmur, rubs, or gallops.  Respiratory: Mildly diminished bases bilaterally. GI: Soft, nontender, non-distended  MS: 4+ bilateral lower extremity edema; No deformity. Neuro:  Nonfocal  Psych: Normal affect   Labs    High Sensitivity Troponin:  No results for input(s): TROPONINIHS in the last 720 hours.   Chemistry Recent Labs  Lab 05/11/22 1617 05/12/22 0316  NA 141 145  K 4.0  4.2  CL 104 108  CO2 25 32  GLUCOSE 178* 126*  BUN 18 18  CREATININE 1.35* 1.35*  CALCIUM 8.8* 8.9  MG 1.8  --   PROT  --  6.1*  ALBUMIN  --  3.3*  AST  --  23  ALT  --  20  ALKPHOS  --  69  BILITOT  --  0.9  GFRNONAA 49* 49*  ANIONGAP 12 5    Lipids No results for input(s): CHOL, TRIG, HDL, LABVLDL, LDLCALC, CHOLHDL in the last 168 hours.  Hematology Recent Labs  Lab 05/11/22 1617 05/12/22 0316  WBC 4.5 5.8  RBC 4.03* 3.93*  HGB 12.5* 12.4*  HCT 39.9 37.8*  MCV 99.0 96.2  MCH 31.0 31.6  MCHC 31.3 32.8  RDW 15.1 15.0  PLT 164 161   Thyroid No results for input(s): TSH, FREET4 in the last 168 hours.  BNPNo results for input(s): BNP, PROBNP in the last 168 hours.  DDimer No results for input(s): DDIMER in the last 168 hours.   Radiology    DG Chest Portable 1 View  Result Date: 05/11/2022 CLINICAL DATA:  Shortness of breath and leg swelling. EXAM: PORTABLE CHEST  1 VIEW COMPARISON:  10/14/2015 FINDINGS: Cardiomegaly and pulmonary vascular congestion noted. Mild interstitial opacities are present. Bilateral LOWER lung opacities are noted and may represent atelectasis/consolidation/effusions are present. No pneumothorax or acute bony abnormality. IMPRESSION: 1. Cardiomegaly with pulmonary vascular congestion and probable mild interstitial edema. 2. Bilateral LOWER lung atelectasis/consolidation/effusions. Electronically Signed   By: Margarette Canada M.D.   On: 05/11/2022 16:51    Cardiac Studies   ECHO pending  Patient Profile     88 y.o. male here with lower extremity edema, weakness, atrial fibrillation persistent with bradycardia, moderate aortic regurgitation  Assessment & Plan    -Continue to hold diltiazem CD 360 mg.  [place].  If bradycardia persists tomorrow, we will get EP involved for potential pacemaker implantation.  This may be contributing significantly to his weakness. -Continue with IV diuresis, good output.  Continues to have marked 4+ lower extremity  edema bilaterally. -Creatinine is stable. -Continue with Eliquis.    For questions or updates, please contact [place] Please consult [email or web address] for contact info under        Signed, Candee Furbish, MD  05/12/2022, 9:02 AM

## 2022-05-12 NOTE — Progress Notes (Signed)
PROGRESS NOTE    Barry Burgess  [id number] DOB: 02/10/1926 DOA: 05/11/2022 PCP: Catha GosselinLittle, Kevin, MD    Brief Narrative:  Barry Burgess is a 88 y.o. male with medical history significant of hyperlipidemia, hypertension, atrial fibrillation, BPH presenting with shortness of breath and edema. He was also found to be bradycardic and hypothermic.     Assessment and Plan:  CHF exacerbation > Acute CHF in the setting of A-fib with slow ventricular response/bradycardia as below. > No prior diagnosis of this per chart review.  Last echo was in 2018 and showed EF of 60-65%, normal RV function, normal wall motion of the LV. - Appreciate cardiology recommendations - Monitor on telemetry/progressive - Continue with Lasix  - Strict I's and O's, daily weights - Echocardiogram pending    A-fib with SVR > Patient noted to have bradycardia in the ED in the 30s to 40s.  Remaining hemodynamically stable for now with blood pressure in the 100s to 110s. - Appreciate cardiology recommendations: If bradycardia persists tomorrow, we will get EP involved for potential pacemaker implantation.  This may be contributing significantly to his weakness. - Hold diltiazem due to bradycardia - Continue home Eliquis  AKI > Creatinine elevated 1.35 from baseline of 1.  This is in the setting of acute CHF as above. - Continue with diuresis as above - daily labs  Hypertension > Holding home losartan and diltiazem and Lasix - Receiving IV Lasix for volume overload  Hyperlipidemia - Continue home pravastatin  BPH - Continue home finasteride   Hx of Cataracts Glaucoma - Continue home eyedrops  DVT prophylaxis:  apixaban (ELIQUIS) tablet 5 mg    Code Status: Full Code Family Communication:   Disposition Plan:  Level of care: Progressive Status is: Inpatient Remains inpatient appropriate because: HR low    Consultants:  cards   Subjective: Denies CP, no cold (very hard of  hearing)  Objective: Vitals:   05/12/22 1100 05/12/22 1115 05/12/22 1146 05/12/22 1245  BP: (!) 93/52 (!) 90/51  118/62  Pulse: (!) 31 (!) 35  (!) 44  Resp: 15 16  15   Temp:   (S) (!) 93.9 F (34.4 C)   TempSrc:   Rectal   SpO2: 95% 92%  96%  Weight:        Intake/Output Summary (Last 24 hours) at 05/12/2022 1331 Last data filed at 05/12/2022 1245 Gross per 24 hour  Intake --  Output 1175 ml  Net -1175 ml   Filed Weights   05/11/22 2000  Weight: 97.5 kg    Examination:   General: Appearance:     Overweight male in no acute distress     Lungs:     respirations unlabored  Heart:    Bradycardic. irregular   MS:   All extremities are intact. + chronic skin changes, + edema   Neurologic:   Awake, alert, very hard of hearing        Data Reviewed: I have personally reviewed following labs and imaging studies  CBC: Recent Labs  Lab 05/11/22 1617 05/12/22 0316  WBC 4.5 5.8  NEUTROABS 3.4  --   HGB 12.5* 12.4*  HCT 39.9 37.8*  MCV 99.0 96.2  PLT 164 161   Basic Metabolic Panel: Recent Labs  Lab 05/11/22 1617 05/12/22 0316  NA 141 145  K 4.0 4.2  CL 104 108  CO2 25 32  GLUCOSE 178* 126*  BUN 18 18  CREATININE 1.35* 1.35*  CALCIUM 8.8* 8.9  MG 1.8  --  GFR: CrCl cannot be calculated (Unknown ideal weight.). Liver Function Tests: Recent Labs  Lab 05/12/22 0316  AST 23  ALT 20  ALKPHOS 69  BILITOT 0.9  PROT 6.1*  ALBUMIN 3.3*   No results for input(s): LIPASE, AMYLASE in the last 168 hours. No results for input(s): AMMONIA in the last 168 hours. Coagulation Profile: No results for input(s): INR, PROTIME in the last 168 hours. Cardiac Enzymes: No results for input(s): CKTOTAL, CKMB, CKMBINDEX, TROPONINI in the last 168 hours. BNP (last 3 results) No results for input(s): PROBNP in the last 8760 hours. HbA1C: No results for input(s): HGBA1C in the last 72 hours. CBG: No results for input(s): GLUCAP in the last 168 hours. Lipid  Profile: No results for input(s): CHOL, HDL, LDLCALC, TRIG, CHOLHDL, LDLDIRECT in the last 72 hours. Thyroid Function Tests: No results for input(s): TSH, T4TOTAL, FREET4, T3FREE, THYROIDAB in the last 72 hours. Anemia Panel: No results for input(s): VITAMINB12, FOLATE, FERRITIN, TIBC, IRON, RETICCTPCT in the last 72 hours. Sepsis Labs: No results for input(s): PROCALCITON, LATICACIDVEN in the last 168 hours.  No results found for this or any previous visit (from the past 240 hour(s)).       Radiology Studies: DG Chest Portable 1 View  Result Date: 05/11/2022 CLINICAL DATA:  Shortness of breath and leg swelling. EXAM: PORTABLE CHEST 1 VIEW COMPARISON:  10/14/2015 FINDINGS: Cardiomegaly and pulmonary vascular congestion noted. Mild interstitial opacities are present. Bilateral LOWER lung opacities are noted and may represent atelectasis/consolidation/effusions are present. No pneumothorax or acute bony abnormality. IMPRESSION: 1. Cardiomegaly with pulmonary vascular congestion and probable mild interstitial edema. 2. Bilateral LOWER lung atelectasis/consolidation/effusions. Electronically Signed   By: Margarette Canada M.D.   On: 05/11/2022 16:51        Scheduled Meds:  apixaban  5 mg Oral BID   dorzolamide-timolol  1 drop Both Eyes BID   finasteride  5 mg Oral Daily   furosemide  60 mg Intravenous BID   latanoprost  1 drop Both Eyes q morning   pravastatin  40 mg Oral QHS   sodium chloride flush  3 mL Intravenous Q12H   Continuous Infusions:   LOS: 0 days    Time spent: 45 minutes spent on chart review, discussion with nursing staff, consultants, updating family and interview/physical exam; more than 50% of that time was spent in counseling and/or coordination of care.    Geradine Girt, DO [facility] Available via Epic secure chat 7am-7pm After these hours, please refer to coverage provider listed on [email or web address] 05/12/2022, 1:31 PM

## 2022-05-12 NOTE — ED Notes (Signed)
Bair hugger placed with 2 blankets on top. GCS 15

## 2022-05-12 NOTE — Hospital Course (Signed)
Barry Burgess is a 88 y.o. male with medical history significant of hyperlipidemia, hypertension, atrial fibrillation, BPH presenting with shortness of breath and edema. He was also found to be bradycardic and hypothermic.

## 2022-05-12 NOTE — ED Notes (Signed)
Assisted pt with lunch tray.  

## 2022-05-13 DIAGNOSIS — N179 Acute kidney failure, unspecified: Secondary | ICD-10-CM | POA: Diagnosis not present

## 2022-05-13 DIAGNOSIS — I5033 Acute on chronic diastolic (congestive) heart failure: Secondary | ICD-10-CM | POA: Diagnosis not present

## 2022-05-13 DIAGNOSIS — I4891 Unspecified atrial fibrillation: Secondary | ICD-10-CM | POA: Diagnosis not present

## 2022-05-13 MED ORDER — NYSTATIN 100000 UNIT/GM EX CREA
TOPICAL_CREAM | Freq: Two times a day (BID) | CUTANEOUS | Status: DC
  Administered 2022-05-13 – 2022-05-15 (×3): 1 via TOPICAL
  Filled 2022-05-13: qty 30

## 2022-05-13 MED ORDER — GERHARDT'S BUTT CREAM
TOPICAL_CREAM | Freq: Every day | CUTANEOUS | Status: DC | PRN
  Filled 2022-05-13: qty 1

## 2022-05-13 NOTE — Progress Notes (Signed)
PROGRESS NOTE    Barry Burgess  [id number] DOB: 08/28/1926 DOA: 05/11/2022 PCP: Hulan Fess, MD    Brief Narrative:  No notes on file    Assessment and Plan:  Acute diastolic CHF -In the setting of A-fib with slow ventricular response, bradycardia -Echo this admission with EF of 50-55%, enlarged RV -Chest x-ray with pulmonary vascular congestion and effusion -Improving on diuretics, he is 2.5 L negative -Continue Lasix 60 Mg twice daily -Add Aldactone tomorrow Ambulate, PT OT eval   Persistent A-fib Bradycardia -Improved, heart rate in the 30s-40s on admission, diltiazem CD on hold -Heart rate improved now, continue Eliquis  AKI > Creatinine elevated 1.35 from baseline of 1, cardiorenal, monitor with diuresis, losartan on hold  Hypertension -Stable, losartan and diltiazem on hold  Hyperlipidemia - Continue home pravastatin  BPH - Continue home finasteride   Hx of Cataracts Glaucoma - Continue home eyedrops  DVT prophylaxis: apixaban (ELIQUIS) tablet 5 mg    Code Status: Full Code Family Communication: Discussed with patient in detail, no family at bedside Disposition Plan: Home likely 48 hours  Consultants:  cards   Subjective: -Feels better overall, breathing is improving  Objective: Vitals:   05/13/22 0503 05/13/22 0811 05/13/22 1010 05/13/22 1157  BP: 133/70 115/72 119/72 124/79  Pulse: 74 83  70  Resp: 17 16  20   Temp: 97.9 F (36.6 C)   98 F (36.7 C)  TempSrc: Oral   Oral  SpO2: 96% 94%  94%  Weight:      Height:        Intake/Output Summary (Last 24 hours) at 05/13/2022 1348 Last data filed at 05/13/2022 0840 Gross per 24 hour  Intake 240 ml  Output 2300 ml  Net -2060 ml   Filed Weights   05/11/22 2000 05/12/22 1755 05/13/22 0108  Weight: 97.5 kg 93 kg 90.6 kg    Examination:   Gen: Elderly pleasant male sitting up in bed awake, Alert, Oriented X 3, no distress HEENT: no JVD Lungs: Poor air movement otherwise  clear CVS: S1S2/RRR Abd: soft, Non tender, non distended, BS present Extremities: 2+ edema with chronic skin changes Skin: As above      Data Reviewed: I have personally reviewed following labs and imaging studies  CBC: Recent Labs  Lab 05/11/22 1617 05/12/22 0316  WBC 4.5 5.8  NEUTROABS 3.4  --   HGB 12.5* 12.4*  HCT 39.9 37.8*  MCV 99.0 96.2  PLT 164 [id number]   Basic Metabolic Panel: Recent Labs  Lab 05/11/22 1617 05/12/22 0316  NA 141 145  K 4.0 4.2  CL 104 108  CO2 25 32  GLUCOSE 178* 126*  BUN 18 18  CREATININE 1.35* 1.35*  CALCIUM 8.8* 8.9  MG 1.8  --    GFR: Estimated Creatinine Clearance: 37.5 mL/min (A) (by C-G formula based on SCr of 1.35 mg/dL (H)). Liver Function Tests: Recent Labs  Lab 05/12/22 0316  AST 23  ALT 20  ALKPHOS 69  BILITOT 0.9  PROT 6.1*  ALBUMIN 3.3*   No results for input(s): LIPASE, AMYLASE in the last 168 hours. No results for input(s): AMMONIA in the last 168 hours. Coagulation Profile: No results for input(s): INR, PROTIME in the last 168 hours. Cardiac Enzymes: No results for input(s): CKTOTAL, CKMB, CKMBINDEX, TROPONINI in the last 168 hours. BNP (last 3 results) No results for input(s): PROBNP in the last 8760 hours. HbA1C: No results for input(s): HGBA1C in the last 72 hours. CBG: No results  for input(s): GLUCAP in the last 168 hours. Lipid Profile: No results for input(s): CHOL, HDL, LDLCALC, TRIG, CHOLHDL, LDLDIRECT in the last 72 hours. Thyroid Function Tests: No results for input(s): TSH, T4TOTAL, FREET4, T3FREE, THYROIDAB in the last 72 hours. Anemia Panel: No results for input(s): VITAMINB12, FOLATE, FERRITIN, TIBC, IRON, RETICCTPCT in the last 72 hours. Sepsis Labs: No results for input(s): PROCALCITON, LATICACIDVEN in the last 168 hours.  No results found for this or any previous visit (from the past 240 hour(s)).       Radiology Studies: DG Chest Portable 1 View  Result Date: 05/11/2022 CLINICAL  DATA:  Shortness of breath and leg swelling. EXAM: PORTABLE CHEST 1 VIEW COMPARISON:  10/14/2015 FINDINGS: Cardiomegaly and pulmonary vascular congestion noted. Mild interstitial opacities are present. Bilateral LOWER lung opacities are noted and may represent atelectasis/consolidation/effusions are present. No pneumothorax or acute bony abnormality. IMPRESSION: 1. Cardiomegaly with pulmonary vascular congestion and probable mild interstitial edema. 2. Bilateral LOWER lung atelectasis/consolidation/effusions. Electronically Signed   By: Margarette Canada M.D.   On: 05/11/2022 16:51   ECHOCARDIOGRAM COMPLETE  Result Date: 05/12/2022    ECHOCARDIOGRAM REPORT   Patient Name:   Barry Burgess Date of Exam: 05/12/2022 Medical Rec #:  [id number]         Height:       72.0 in Accession #:    [id number]        Weight:       215.0 lb Date of Birth:  1926/07/06          BSA:          2.197 m Patient Age:    58 years          BP:           108/55 mmHg Patient Gender: M                 HR:           88 bpm. Exam Location:  Inpatient Procedure: 2D Echo, Cardiac Doppler and Color Doppler Indications:    AI  History:        Patient has prior history of Echocardiogram examinations, most                 recent 10/08/2017. Arrythmias:Atrial Fibrillation,                 Signs/Symptoms:Murmur; Risk Factors:Dyslipidemia. 11/13/2015                 cardioversion.  Sonographer:    [place] Referring Phys: [id number] [place]  1. Left ventricular ejection fraction, by estimation, is 50 to 55%. The left ventricle has low normal function. The left ventricle demonstrates global hypokinesis. Left ventricular diastolic parameters are indeterminate.  2. Right ventricular systolic function is normal. The right ventricular size is severely enlarged. Mildly increased right ventricular wall thickness.  3. Left atrial size was severely dilated.  4. Right atrial size was severely dilated.  5. The mitral valve is grossly  normal. No evidence of mitral valve regurgitation. No evidence of mitral stenosis.  6. Tricuspid valve regurgitation is mild to moderate and atrial functional in nature.  7. The aortic valve is tricuspid. There is moderate calcification of the aortic valve. There is mild thickening of the aortic valve. Aortic valve regurgitation is trivial. Aortic valve sclerosis is present, with no evidence of aortic valve stenosis. Comparison(s): Unable to access 2018 study, further atrial dilation from prior report.  FINDINGS  Left Ventricle: Left ventricular ejection fraction, by estimation, is 50 to 55%. The left ventricle has low normal function. The left ventricle demonstrates global hypokinesis. The left ventricular internal cavity size was normal in size. There is no left ventricular hypertrophy. Left ventricular diastolic parameters are indeterminate. Right Ventricle: The right ventricular size is severely enlarged. Mildly increased right ventricular wall thickness. Right ventricular systolic function is normal. Left Atrium: Left atrial size was severely dilated. Right Atrium: Right atrial size was severely dilated. Pericardium: There is no evidence of pericardial effusion. Mitral Valve: The mitral valve is grossly normal. No evidence of mitral valve regurgitation. No evidence of mitral valve stenosis. Tricuspid Valve: The tricuspid valve is grossly normal. Tricuspid valve regurgitation is mild to moderate. Aortic Valve: The aortic valve is tricuspid. There is moderate calcification of the aortic valve. There is mild thickening of the aortic valve. Aortic valve regurgitation is trivial. Aortic regurgitation PHT measures 1164 msec. Aortic valve sclerosis is present, with no evidence of aortic valve stenosis. Aortic valve mean gradient measures 2.0 mmHg. Aortic valve peak gradient measures 5.3 mmHg. Aortic valve area, by VTI measures 2.17 cm. Pulmonic Valve: The pulmonic valve was normal in structure. Pulmonic valve  regurgitation is not visualized. No evidence of pulmonic stenosis. Aorta: The aortic root and ascending aorta are structurally normal, with no evidence of dilitation. IAS/Shunts: No atrial level shunt detected by color flow Doppler.  LEFT VENTRICLE PLAX 2D LVIDd:         4.70 cm LVIDs:         3.30 cm LV PW:         0.80 cm LV IVS:        1.00 cm LVOT diam:     2.00 cm LV SV:         52 LV SV Index:   24 LVOT Area:     3.14 cm  LV Volumes (MOD) LV vol d, MOD A4C: 57.5 ml LV vol s, MOD A4C: 39.3 ml LV SV MOD A4C:     57.5 ml RIGHT VENTRICLE RV Basal diam:  5.10 cm RV Mid diam:    3.60 cm RV S prime:     10.40 cm/s TAPSE (M-mode): 1.5 cm LEFT ATRIUM              Index        RIGHT ATRIUM           Index LA diam:        4.10 cm  1.87 cm/m   RA Area:     37.80 cm LA Vol (A2C):   113.0 ml 51.43 ml/m  RA Volume:   165.00 ml 75.10 ml/m LA Vol (A4C):   73.0 ml  33.23 ml/m LA Biplane Vol: 95.8 ml  43.60 ml/m  AORTIC VALVE                    PULMONIC VALVE AV Area (Vmax):    2.02 cm     PV Vmax:       0.77 m/s AV Area (Vmean):   1.94 cm     PV Peak grad:  2.4 mmHg AV Area (VTI):     2.17 cm AV Vmax:           115.00 cm/s AV Vmean:          67.700 cm/s AV VTI:            0.240 m AV Peak Grad:  5.3 mmHg AV Mean Grad:      2.0 mmHg LVOT Vmax:         73.90 cm/s LVOT Vmean:        41.800 cm/s LVOT VTI:          0.166 m LVOT/AV VTI ratio: 0.69 AI PHT:            1164 msec  AORTA Ao Root diam: 3.50 cm Ao Asc diam:  3.30 cm MITRAL VALVE                TRICUSPID VALVE MV Area (PHT): 4.89 cm     TR Peak grad:   30.0 mmHg MV Decel Time: 155 msec     TR Vmax:        274.00 cm/s MV E velocity: 104.00 cm/s                             SHUNTS                             Systemic VTI:  0.17 m                             Systemic Diam: 2.00 cm Rudean Haskell MD Electronically signed by Rudean Haskell MD Signature Date/Time: 05/12/2022/3:11:00 PM    Final         Scheduled Meds:  apixaban  5 mg Oral BID    dorzolamide-timolol  1 drop Both Eyes BID   finasteride  5 mg Oral Daily   furosemide  60 mg Intravenous BID   latanoprost  1 drop Both Eyes q morning   nystatin cream   Topical BID   pravastatin  40 mg Oral QHS   sodium chloride flush  3 mL Intravenous Q12H   Continuous Infusions:   LOS: 1 day    Time spent: 65min   Domenic Polite, MD [facility]  05/13/2022, 1:48 PM

## 2022-05-13 NOTE — TOC Progression Note (Addendum)
Transition of Care [facility]) - Progression Note    Patient Details  Name: Barry Burgess MRN: [id number] Date of Birth: [birth year]  [facility] CM/SW Contact  Leone Haven, RN Phone Number: 05/13/2022, 11:25 AM  Clinical Narrative:    Patient lives with his son, Barry Burgess and his wife.  He has a walker and a w/chair at home.  Richard will transport him home at [employer].  He has no oxygen at home.  Presents with CHF ex, afib, AKI, per progression rounds, will hold cardizem secondary to low BP.  Conts on IV lasix.  He will benefit from PT eval. NCM asked patient if he would like a HHRN for disease management, he states yes.  NCM left the Medicare .gov  list with patient and family.  The Family states the physical therapist is to see him today also. MD to order pt eval.  TOC will continue to follow for dc needs.   NCM went back to check with patient's choice for HHPT, he states he has no preference.  He states he does not want a HHRN .        Expected Discharge Plan and Services                                                 Social Determinants of Health (SDOH) Interventions    Readmission Risk Interventions     View : No data to display.

## 2022-05-13 NOTE — TOC Progression Note (Signed)
Transition of Care [facility]) - Progression Note    Patient Details  Name: Barry Burgess MRN: [id number] Date of Birth: 1926/04/22  Transition of Care [facility]) CM/SW Contact  Zenon Mayo, RN Phone Number: 05/13/2022, 4:38 PM  Clinical Narrative:    Patient lives with his son, Delfino Lovett and his wife.  He has a walker and a w/chair at home.  Richard will transport him home at [employer].  He has no oxygen at home.  Presents with CHF ex, afib, AKI, per progression rounds, will hold cardizem secondary to low BP.  Conts on IV lasix.  He will benefit from PT eval. NCM asked patient if he would like a HHRN for disease management, he states yes.  NCM left the Medicare .gov  list with patient and family.  The Family states the physical therapist is to see him today also. MD to order pt eval.  TOC will continue to follow for dc needs.   NCM went back to check with patient's choice for HHPT, he states he has no preference.  He states he does not want a HHRN .NCM made referral to [facility] with Alvis Lemmings for HHPT,, he can take referral. Soc will begin 24 to 48 hrs post dc.      Expected Discharge Plan: [place] Barriers to Discharge: Continued Medical Work up  Expected Discharge Plan and Services Expected Discharge Plan: [place] In-house Referral: NA Discharge Planning Services: CM Consult Post Acute Care Choice: [place] arrangements for the past 2 months: Single Family Home                   DME Agency: NA       HH Arranged: PT HH Agency: [place] Date [place]: 05/13/22 Time [place]: 1637 Representative spoke with at [place]: [place] ([place]) Interventions    Readmission Risk Interventions     View : No data to display.

## 2022-05-13 NOTE — Progress Notes (Addendum)
Progress Note  Patient Name: Barry Burgess Date of Encounter: 05/13/2022  [facility] Cardiologist: Charlton HawsPeter Nishan, MD   Subjective   Patient reports feeling much better today, feels like he could "go dancing". Weakness has improved. Denies any chest pain, SOB.  Inpatient Medications    Scheduled Meds:  apixaban  5 mg Oral BID   dorzolamide-timolol  1 drop Both Eyes BID   finasteride  5 mg Oral Daily   furosemide  60 mg Intravenous BID   latanoprost  1 drop Both Eyes q morning   nystatin cream   Topical BID   pravastatin  40 mg Oral QHS   sodium chloride flush  3 mL Intravenous Q12H   Continuous Infusions:  PRN Meds: acetaminophen **OR** acetaminophen, polyethylene glycol   Vital Signs    Vitals:   05/12/22 2007 05/13/22 0108 05/13/22 0503 05/13/22 0811  BP: 114/62 133/71 133/70 115/72  Pulse: 67  74 83  Resp: 19  17 16   Temp: 97.9 F (36.6 C) (!) 97.5 F (36.4 C) 97.9 F (36.6 C)   TempSrc: Oral Oral Oral   SpO2: 98%  96% 94%  Weight:  90.6 kg    Height:        Intake/Output Summary (Last 24 hours) at 05/13/2022 0921 Last data filed at 05/13/2022 0840 Gross per 24 hour  Intake 240 ml  Output 2525 ml  Net -2285 ml      05/13/2022    1:08 AM 05/12/2022    5:55 PM 05/11/2022    8:00 PM  Last 3 Weights  Weight (lbs) 199 lb 11.8 oz 205 lb 0.4 oz 215 lb  Weight (kg) 90.6 kg 93 kg 97.523 kg      Telemetry    Atrial fibrillation, HR 60s-70s - Personally Reviewed  ECG    No new tracings since 5/22 - Personally Reviewed  Physical Exam   GEN: No acute distress. Sitting upright in the bed Neck: No JVD Cardiac: Irregular rate and rhythm, soft diastolic murmur  Respiratory: Diminished breath sounds in lung bases GI: Soft, nontender, non-distended  MS: 2+ pitting edema bilateral lower extremities; No deformity. Neuro:  Nonfocal  Psych: Normal affect   Labs    High Sensitivity Troponin:  No results for input(s): TROPONINIHS in the last 720 hours.    Chemistry Recent Labs  Lab 05/11/22 1617 05/12/22 0316  NA 141 145  K 4.0 4.2  CL 104 108  CO2 25 32  GLUCOSE 178* 126*  BUN 18 18  CREATININE 1.35* 1.35*  CALCIUM 8.8* 8.9  MG 1.8  --   PROT  --  6.1*  ALBUMIN  --  3.3*  AST  --  23  ALT  --  20  ALKPHOS  --  69  BILITOT  --  0.9  GFRNONAA 49* 49*  ANIONGAP 12 5    Lipids No results for input(s): CHOL, TRIG, HDL, LABVLDL, LDLCALC, CHOLHDL in the last 168 hours.  Hematology Recent Labs  Lab 05/11/22 1617 05/12/22 0316  WBC 4.5 5.8  RBC 4.03* 3.93*  HGB 12.5* 12.4*  HCT 39.9 37.8*  MCV 99.0 96.2  MCH 31.0 31.6  MCHC 31.3 32.8  RDW 15.1 15.0  PLT 164 161   Thyroid No results for input(s): TSH, FREET4 in the last 168 hours.  BNPNo results for input(s): BNP, PROBNP in the last 168 hours.  DDimer No results for input(s): DDIMER in the last 168 hours.   Radiology    DG Chest  Portable 1 View  Result Date: 05/11/2022 CLINICAL DATA:  Shortness of breath and leg swelling. EXAM: PORTABLE CHEST 1 VIEW COMPARISON:  10/14/2015 FINDINGS: Cardiomegaly and pulmonary vascular congestion noted. Mild interstitial opacities are present. Bilateral LOWER lung opacities are noted and may represent atelectasis/consolidation/effusions are present. No pneumothorax or acute bony abnormality. IMPRESSION: 1. Cardiomegaly with pulmonary vascular congestion and probable mild interstitial edema. 2. Bilateral LOWER lung atelectasis/consolidation/effusions. Electronically Signed   By: Harmon Pier M.D.   On: 05/11/2022 16:51   ECHOCARDIOGRAM COMPLETE  Result Date: 05/12/2022    ECHOCARDIOGRAM REPORT   Patient Name:   Barry Burgess Date of Exam: 05/12/2022 Medical Rec #:  [id number]         Height:       72.0 in Accession #:    [id number]        Weight:       215.0 lb Date of Birth:  [birth year]          BSA:          2.197 m Patient Age:    88 years          BP:           108/55 mmHg Patient Gender: M                 HR:           88 bpm. Exam  Location:  Inpatient Procedure: 2D Echo, Cardiac Doppler and Color Doppler Indications:    AI  History:        Patient has prior history of Echocardiogram examinations, most                 recent 10/08/2017. Arrythmias:Atrial Fibrillation,                 Signs/Symptoms:Murmur; Risk Factors:Dyslipidemia. 11/13/2015                 cardioversion.  Sonographer:    Neomia Dear RDCS Referring Phys: [id number] Cecille Po MELVIN IMPRESSIONS  1. Left ventricular ejection fraction, by estimation, is 50 to 55%. The left ventricle has low normal function. The left ventricle demonstrates global hypokinesis. Left ventricular diastolic parameters are indeterminate.  2. Right ventricular systolic function is normal. The right ventricular size is severely enlarged. Mildly increased right ventricular wall thickness.  3. Left atrial size was severely dilated.  4. Right atrial size was severely dilated.  5. The mitral valve is grossly normal. No evidence of mitral valve regurgitation. No evidence of mitral stenosis.  6. Tricuspid valve regurgitation is mild to moderate and atrial functional in nature.  7. The aortic valve is tricuspid. There is moderate calcification of the aortic valve. There is mild thickening of the aortic valve. Aortic valve regurgitation is trivial. Aortic valve sclerosis is present, with no evidence of aortic valve stenosis. Comparison(s): Unable to access 2018 study, further atrial dilation from prior report. FINDINGS  Left Ventricle: Left ventricular ejection fraction, by estimation, is 50 to 55%. The left ventricle has low normal function. The left ventricle demonstrates global hypokinesis. The left ventricular internal cavity size was normal in size. There is no left ventricular hypertrophy. Left ventricular diastolic parameters are indeterminate. Right Ventricle: The right ventricular size is severely enlarged. Mildly increased right ventricular wall thickness. Right ventricular systolic function is  normal. Left Atrium: Left atrial size was severely dilated. Right Atrium: Right atrial size was severely dilated. Pericardium: There is no evidence of pericardial effusion. Mitral Valve: The  mitral valve is grossly normal. No evidence of mitral valve regurgitation. No evidence of mitral valve stenosis. Tricuspid Valve: The tricuspid valve is grossly normal. Tricuspid valve regurgitation is mild to moderate. Aortic Valve: The aortic valve is tricuspid. There is moderate calcification of the aortic valve. There is mild thickening of the aortic valve. Aortic valve regurgitation is trivial. Aortic regurgitation PHT measures 1164 msec. Aortic valve sclerosis is present, with no evidence of aortic valve stenosis. Aortic valve mean gradient measures 2.0 mmHg. Aortic valve peak gradient measures 5.3 mmHg. Aortic valve area, by VTI measures 2.17 cm. Pulmonic Valve: The pulmonic valve was normal in structure. Pulmonic valve regurgitation is not visualized. No evidence of pulmonic stenosis. Aorta: The aortic root and ascending aorta are structurally normal, with no evidence of dilitation. IAS/Shunts: No atrial level shunt detected by color flow Doppler.  LEFT VENTRICLE PLAX 2D LVIDd:         4.70 cm LVIDs:         3.30 cm LV PW:         0.80 cm LV IVS:        1.00 cm LVOT diam:     2.00 cm LV SV:         52 LV SV Index:   24 LVOT Area:     3.14 cm  LV Volumes (MOD) LV vol d, MOD A4C: 57.5 ml LV vol s, MOD A4C: 39.3 ml LV SV MOD A4C:     57.5 ml RIGHT VENTRICLE RV Basal diam:  5.10 cm RV Mid diam:    3.60 cm RV S prime:     10.40 cm/s TAPSE (M-mode): 1.5 cm LEFT ATRIUM              Index        RIGHT ATRIUM           Index LA diam:        4.10 cm  1.87 cm/m   RA Area:     37.80 cm LA Vol (A2C):   113.0 ml 51.43 ml/m  RA Volume:   165.00 ml 75.10 ml/m LA Vol (A4C):   73.0 ml  33.23 ml/m LA Biplane Vol: 95.8 ml  43.60 ml/m  AORTIC VALVE                    PULMONIC VALVE AV Area (Vmax):    2.02 cm     PV Vmax:       0.77  m/s AV Area (Vmean):   1.94 cm     PV Peak grad:  2.4 mmHg AV Area (VTI):     2.17 cm AV Vmax:           115.00 cm/s AV Vmean:          67.700 cm/s AV VTI:            0.240 m AV Peak Grad:      5.3 mmHg AV Mean Grad:      2.0 mmHg LVOT Vmax:         73.90 cm/s LVOT Vmean:        41.800 cm/s LVOT VTI:          0.166 m LVOT/AV VTI ratio: 0.69 AI PHT:            1164 msec  AORTA Ao Root diam: 3.50 cm Ao Asc diam:  3.30 cm MITRAL VALVE  TRICUSPID VALVE MV Area (PHT): 4.89 cm     TR Peak grad:   30.0 mmHg MV Decel Time: 155 msec     TR Vmax:        274.00 cm/s MV E velocity: 104.00 cm/s                             SHUNTS                             Systemic VTI:  0.17 m                             Systemic Diam: 2.00 cm Riley Lam MD Electronically signed by Riley Lam MD Signature Date/Time: 05/12/2022/3:11:00 PM    Final     Cardiac Studies   Echocardiogram 05/12/22  1. Left ventricular ejection fraction, by estimation, is 50 to 55%. The  left ventricle has low normal function. The left ventricle demonstrates  global hypokinesis. Left ventricular diastolic parameters are  indeterminate.   2. Right ventricular systolic function is normal. The right ventricular  size is severely enlarged. Mildly increased right ventricular wall  thickness.   3. Left atrial size was severely dilated.   4. Right atrial size was severely dilated.   5. The mitral valve is grossly normal. No evidence of mitral valve  regurgitation. No evidence of mitral stenosis.   6. Tricuspid valve regurgitation is mild to moderate and atrial  functional in nature.   7. The aortic valve is tricuspid. There is moderate calcification of the  aortic valve. There is mild thickening of the aortic valve. Aortic valve  regurgitation is trivial. Aortic valve sclerosis is present, with no  evidence of aortic valve stenosis.   Comparison(s): Unable to access 2018 study, further atrial dilation from  prior  report.   Patient Profile     88 y.o. male with longstanding persistent atrial fibrillaiton here with lower extremity edema, weakness, atrial fibrillation with bradycardia, moderate aortic regurgitation  Assessment & Plan    Persistent atrial fibrillation  Bradycardia  - Initial EKG and telemetry in ED showed HR in the 30s-40s - We are holding diltiazem 360 mg daily, has been held since 5/22 - Per telemetry, HR is now in the 60s-70s  - Continue Eliquis 5 mg BID   Acute on chronic diastolic heart failure  - Patient presented with significant lower extremity edema  - CXR on 5/22 showed pulmonary vascular congestion and bilateral lower lung atelectasis/consolidation/effusions  - Has been receiving IV lasix 60 mg BID since admission, has received 3 doses this admission. Currently net -3.2 L since admission  - Creatinine stable at 1.35, continues to have pedal edema and diminished breath sounds in lung bases  - Continue diuresis   Aortic Regurgitation  - Previously moderate in 2018 - Echocardiogram this admission showed trivial AR  - No further workup needed this admission, can continue to follow in the outpatient setting   Essential HTN   - Holding diltiazem due to bradycardia, BP stable       For questions or updates, please contact [facility] Please consult [email or web address] for contact info under        Signed, Jonita Albee, PA-C  05/13/2022, 9:21 AM     Personally seen and examined. Agree with above.  Improved. Irregularly irregular, improved heart rate. Telemetry  reviewed, heart rates currently in the 60s to 80s.  Much improved from the 30s to 40s.  Atrial fibrillation slow ventricular response -Diltiazem 240 mg has been discontinued.  We will not restart.  Of course if tachycardia is noted/tachycardia-bradycardia syndrome, we will have to entertain the possibility of pacemaker to support low heart rates while medication management is being utilized.  Chronic  anticoagulation - Continue with Eliquis 5 mg twice a day.  Aortic regurgitation appears trivial on this hospitalization echocardiogram.  Excellent.  Admitted with weakness.  Bradycardia as well as lower extremity edema/diastolic heart failure playing a role.  Continuing with ongoing diuresis.  Edema still 4+ but improved, softer.   Had lengthy conversation with family.  Watching Creat. Labs tomorrow.   Donato Schultz, MD

## 2022-05-13 NOTE — Evaluation (Signed)
Physical Therapy Evaluation Patient Details Name: Barry Burgess MRN: [id number] DOB: 1926-08-02 Today's Date: 05/13/2022  History of Present Illness  Pt is a 88 y/o male admitted secondary to LE swelling and SOB. Thought to be secondary to CHF exacerbation. PMH includes HTN, a fib, CHF, and glaucoma.  Clinical Impression  Pt admitted secondary to problem above with deficits below. Pt requiring min A for steadying for transfers and ambulation this session. Pt reports feeling weaker and unsteadier than normal; recommending HHPT to address deficits. Pt with good support at home from son and daughter in law. Will continue to follow acutely to maximize functional mobility independence and safety.        Recommendations for follow up therapy are one component of a multi-disciplinary discharge planning process, led by the attending physician.  Recommendations may be updated based on patient status, additional functional criteria and insurance authorization.  Follow Up Recommendations Home health PT    Assistance Recommended at Discharge Frequent or constant Supervision/Assistance  Patient can return home with the following  A little help with walking and/or transfers;A little help with bathing/dressing/bathroom;Assistance with cooking/housework;Assist for transportation;Help with stairs or ramp for entrance    Equipment Recommendations None recommended by PT  Recommendations for Other Services       Functional Status Assessment Patient has had a recent decline in their functional status and demonstrates the ability to make significant improvements in function in a reasonable and predictable amount of time.     Precautions / Restrictions Precautions Precautions: Fall Restrictions Weight Bearing Restrictions: No      Mobility  Bed Mobility Overal bed mobility: Needs Assistance Bed Mobility: Supine to Sit, Sit to Supine     Supine to sit: Min assist Sit to supine: Min assist    General bed mobility comments: Min A for LE assist to perform bed mobility tasks.    Transfers Overall transfer level: Needs assistance Equipment used: Rolling walker (2 wheels) Transfers: Sit to/from Stand Sit to Stand: Min assist, From elevated surface           General transfer comment: Min A for steadying assist to stand. Heavy use of UEs to stand from toilet    Ambulation/Gait Ambulation/Gait assistance: Min assist Gait Distance (Feet): 30 Feet (X2) Assistive device: Rolling walker (2 wheels) Gait Pattern/deviations: Step-through pattern, Decreased stride length, Trunk flexed Gait velocity: Decreased     General Gait Details: Increased shakiness noted, but improved with second ambulation trial. Min A for steadying.  Stairs            Wheelchair Mobility    Modified Rankin (Stroke Patients Only)       Balance Overall balance assessment: Needs assistance Sitting-balance support: No upper extremity supported, Feet supported Sitting balance-Leahy Scale: Fair     Standing balance support: Bilateral upper extremity supported Standing balance-Leahy Scale: Poor Standing balance comment: Reliant on BUE support                             Pertinent Vitals/Pain Pain Assessment Pain Assessment: No/denies pain    Home Living Family/patient expects to be discharged to:: Private residence Living Arrangements: Children Available Help at Discharge: Family Type of Home: House Home Access: Stairs to enter Entrance Stairs-Rails: Left Entrance Stairs-Number of Steps: 2-3   Home Layout: One level Home Equipment: Other (comment);Wheelchair - Publishing copy (2 wheels) (bars over toilet)      Prior Function Prior Level of Function :  Needs assist             Mobility Comments: Reports he normally uses RW for mobility tasks ADLs Comments: Reports he normally requires assist for ADL tasks     Hand Dominance        Extremity/Trunk  Assessment   Upper Extremity Assessment Upper Extremity Assessment: Defer to OT evaluation    Lower Extremity Assessment Lower Extremity Assessment: Generalized weakness    Cervical / Trunk Assessment Cervical / Trunk Assessment: Kyphotic  Communication   Communication: HOH  Cognition Arousal/Alertness: Awake/alert Behavior During Therapy: WFL for tasks assessed/performed Overall Cognitive Status: Within Functional Limits for tasks assessed                                          General Comments      Exercises     Assessment/Plan    PT Assessment Patient needs continued PT services  PT Problem List Decreased strength;Decreased activity tolerance;Decreased balance;Decreased mobility;Decreased knowledge of use of DME;Decreased knowledge of precautions;Pain       PT Treatment Interventions DME instruction;Gait training;Stair training;Functional mobility training;Therapeutic exercise;Therapeutic activities;Balance training;Patient/family education    PT Goals (Current goals can be found in the Care Plan section)  Acute Rehab PT Goals Patient Stated Goal: to go home PT Goal Formulation: With patient Time For Goal Achievement: 05/27/22 Potential to Achieve Goals: Good    Frequency Min 3X/week     Co-evaluation               AM-PAC PT "6 Clicks" Mobility  Outcome Measure Help needed turning from your back to your side while in a flat bed without using bedrails?: A Little Help needed moving from lying on your back to sitting on the side of a flat bed without using bedrails?: A Little Help needed moving to and from a bed to a chair (including a wheelchair)?: A Little Help needed standing up from a chair using your arms (e.g., wheelchair or bedside chair)?: A Little Help needed to walk in hospital room?: A Little Help needed climbing 3-5 steps with a railing? : A Lot 6 Click Score: 17    End of Session Equipment Utilized During Treatment: Gait  belt Activity Tolerance: Patient tolerated treatment well Patient left: in bed;with call bell/phone within reach;with bed alarm set;with family/visitor present Nurse Communication: Mobility status PT Visit Diagnosis: Unsteadiness on feet (R26.81);Muscle weakness (generalized) (M62.81);Difficulty in walking, not elsewhere classified (R26.2)    Time: [id number] PT Time Calculation (min) (ACUTE ONLY): 33 min   Charges:   PT Evaluation $PT Eval Moderate Complexity: 1 Mod PT Treatments $Gait Training: 8-22 mins        Lou Miner, DPT  Acute Rehabilitation Services  Pager: [phone] Office: [phone]   Rudean Hitt 05/13/2022, 3:56 PM

## 2022-05-14 ENCOUNTER — Encounter (HOSPITAL_COMMUNITY): Payer: Self-pay | Admitting: Internal Medicine

## 2022-05-14 DIAGNOSIS — I5033 Acute on chronic diastolic (congestive) heart failure: Secondary | ICD-10-CM | POA: Diagnosis not present

## 2022-05-14 LAB — BASIC METABOLIC PANEL
Anion gap: 5 (ref 5–15)
BUN: 20 mg/dL (ref 8–23)
CO2: 35 mmol/L — ABNORMAL HIGH (ref 22–32)
Calcium: 8.6 mg/dL — ABNORMAL LOW (ref 8.9–10.3)
Chloride: 97 mmol/L — ABNORMAL LOW (ref 98–111)
Creatinine, Ser: 1.58 mg/dL — ABNORMAL HIGH (ref 0.61–1.24)
GFR, Estimated: 41 mL/min — ABNORMAL LOW (ref >60)
Glucose, Bld: 102 mg/dL — ABNORMAL HIGH (ref 70–99)
Potassium: 3.7 mmol/L (ref 3.5–5.1)
Sodium: 137 mmol/L (ref 135–145)

## 2022-05-14 LAB — MAGNESIUM: Magnesium: 2 mg/dL (ref 1.7–2.4)

## 2022-05-14 MED ORDER — POTASSIUM CHLORIDE CRYS ER 20 MEQ PO TBCR
40.0000 meq | EXTENDED_RELEASE_TABLET | Freq: Once | ORAL | Status: AC
  Administered 2022-05-14: 40 meq via ORAL
  Filled 2022-05-14: qty 2

## 2022-05-14 NOTE — Progress Notes (Signed)
Orthopedic Tech Progress Note Patient Details:  WILLARD BROOME July 18, 1926 [id number] Instructed patients family to remove unna boot if discharged before new pair applied  Ortho Devices Type of Ortho Device: Haematologist Ortho Device/Splint Location: BLE Ortho Device/Splint Interventions: Application, Ordered   Post Interventions Patient Tolerated: Well  Marrie Chandra A Berkley Cronkright 05/14/2022, 4:17 PM

## 2022-05-14 NOTE — Progress Notes (Addendum)
Progress Note  Patient Name: Barry Burgess Date of Encounter: 05/14/2022  [facility] Cardiologist: Jenkins Rouge, MD   Subjective   Patient denies any chest pain, palpitations, SOB, orthopnea.   Inpatient Medications    Scheduled Meds:  apixaban  5 mg Oral BID   dorzolamide-timolol  1 drop Both Eyes BID   finasteride  5 mg Oral Daily   furosemide  60 mg Intravenous BID   latanoprost  1 drop Both Eyes q morning   nystatin cream   Topical BID   pravastatin  40 mg Oral QHS   sodium chloride flush  3 mL Intravenous Q12H   Continuous Infusions:  PRN Meds: acetaminophen **OR** acetaminophen, Gerhardt's butt cream, polyethylene glycol   Vital Signs    Vitals:   05/13/22 1010 05/13/22 1157 05/14/22 0300 05/14/22 0800  BP: 119/72 124/79 132/74 (!) 117/103  Pulse:  70 80 73  Resp:  20 16 16   Temp:  98 F (36.7 C) 97.8 F (36.6 C) (!) 97.4 F (36.3 C)  TempSrc:  Oral Oral Oral  SpO2:  94% 94% 93%  Weight:      Height:        Intake/Output Summary (Last 24 hours) at 05/14/2022 0839 Last data filed at 05/14/2022 0300 Gross per 24 hour  Intake 480 ml  Output 3225 ml  Net -2745 ml      05/13/2022    1:08 AM 05/12/2022    5:55 PM 05/11/2022    8:00 PM  Last 3 Weights  Weight (lbs) 199 lb 11.8 oz 205 lb 0.4 oz 215 lb  Weight (kg) 90.6 kg 93 kg 97.523 kg      Telemetry    Atrial fibrillation, HR in the 70s-80s - Personally Reviewed  ECG    No new tracings since 5/22 - Personally Reviewed  Physical Exam   GEN: No acute distress. Sitting upright in the bed eating breakfast  Neck: No JVD Cardiac: Irregular rate and rhythm, no murmurs   Respiratory: Crackles in bilateral lung bases  GI: Soft, nontender, non-distended  MS: 3+ pitting edema, improved from yesterday but still significant  Neuro:  Nonfocal  Psych: Normal affect   Labs    High Sensitivity Troponin:  No results for input(s): TROPONINIHS in the last 720 hours.   Chemistry Recent Labs  Lab  05/11/22 1617 05/12/22 0316 05/14/22 0422  NA 141 145 137  K 4.0 4.2 3.7  CL 104 108 97*  CO2 25 32 35*  GLUCOSE 178* 126* 102*  BUN 18 18 20   CREATININE 1.35* 1.35* 1.58*  CALCIUM 8.8* 8.9 8.6*  MG 1.8  --   --   PROT  --  6.1*  --   ALBUMIN  --  3.3*  --   AST  --  23  --   ALT  --  20  --   ALKPHOS  --  69  --   BILITOT  --  0.9  --   GFRNONAA 49* 49* 41*  ANIONGAP 12 5 5     Lipids No results for input(s): CHOL, TRIG, HDL, LABVLDL, LDLCALC, CHOLHDL in the last 168 hours.  Hematology Recent Labs  Lab 05/11/22 1617 05/12/22 0316  WBC 4.5 5.8  RBC 4.03* 3.93*  HGB 12.5* 12.4*  HCT 39.9 37.8*  MCV 99.0 96.2  MCH 31.0 31.6  MCHC 31.3 32.8  RDW 15.1 15.0  PLT 164 161   Thyroid No results for input(s): TSH, FREET4 in the last 168 hours.  BNPNo results for input(s): BNP, PROBNP in the last 168 hours.  DDimer No results for input(s): DDIMER in the last 168 hours.   Radiology    ECHOCARDIOGRAM COMPLETE  Result Date: 05/12/2022    ECHOCARDIOGRAM REPORT   Patient Name:   Barry Burgess Date of Exam: 05/12/2022 Medical Rec #:  [id number]         Height:       72.0 in Accession #:    [id number]        Weight:       215.0 lb Date of Birth:  07-08-1926          BSA:          2.197 m Patient Age:    27 years          BP:           108/55 mmHg Patient Gender: M                 HR:           88 bpm. Exam Location:  Inpatient Procedure: 2D Echo, Cardiac Doppler and Color Doppler Indications:    AI  History:        Patient has prior history of Echocardiogram examinations, most                 recent 10/08/2017. Arrythmias:Atrial Fibrillation,                 Signs/Symptoms:Murmur; Risk Factors:Dyslipidemia. 11/13/2015                 cardioversion.  Sonographer:    [place] Referring Phys: [id number] [place]  1. Left ventricular ejection fraction, by estimation, is 50 to 55%. The left ventricle has low normal function. The left ventricle demonstrates global  hypokinesis. Left ventricular diastolic parameters are indeterminate.  2. Right ventricular systolic function is normal. The right ventricular size is severely enlarged. Mildly increased right ventricular wall thickness.  3. Left atrial size was severely dilated.  4. Right atrial size was severely dilated.  5. The mitral valve is grossly normal. No evidence of mitral valve regurgitation. No evidence of mitral stenosis.  6. Tricuspid valve regurgitation is mild to moderate and atrial functional in nature.  7. The aortic valve is tricuspid. There is moderate calcification of the aortic valve. There is mild thickening of the aortic valve. Aortic valve regurgitation is trivial. Aortic valve sclerosis is present, with no evidence of aortic valve stenosis. Comparison(s): Unable to access 2018 study, further atrial dilation from prior report. FINDINGS  Left Ventricle: Left ventricular ejection fraction, by estimation, is 50 to 55%. The left ventricle has low normal function. The left ventricle demonstrates global hypokinesis. The left ventricular internal cavity size was normal in size. There is no left ventricular hypertrophy. Left ventricular diastolic parameters are indeterminate. Right Ventricle: The right ventricular size is severely enlarged. Mildly increased right ventricular wall thickness. Right ventricular systolic function is normal. Left Atrium: Left atrial size was severely dilated. Right Atrium: Right atrial size was severely dilated. Pericardium: There is no evidence of pericardial effusion. Mitral Valve: The mitral valve is grossly normal. No evidence of mitral valve regurgitation. No evidence of mitral valve stenosis. Tricuspid Valve: The tricuspid valve is grossly normal. Tricuspid valve regurgitation is mild to moderate. Aortic Valve: The aortic valve is tricuspid. There is moderate calcification of the aortic valve. There is mild thickening of the aortic valve. Aortic valve regurgitation is  trivial.  Aortic regurgitation PHT measures 1164 msec. Aortic valve sclerosis is present, with no evidence of aortic valve stenosis. Aortic valve mean gradient measures 2.0 mmHg. Aortic valve peak gradient measures 5.3 mmHg. Aortic valve area, by VTI measures 2.17 cm. Pulmonic Valve: The pulmonic valve was normal in structure. Pulmonic valve regurgitation is not visualized. No evidence of pulmonic stenosis. Aorta: The aortic root and ascending aorta are structurally normal, with no evidence of dilitation. IAS/Shunts: No atrial level shunt detected by color flow Doppler.  LEFT VENTRICLE PLAX 2D LVIDd:         4.70 cm LVIDs:         3.30 cm LV PW:         0.80 cm LV IVS:        1.00 cm LVOT diam:     2.00 cm LV SV:         52 LV SV Index:   24 LVOT Area:     3.14 cm  LV Volumes (MOD) LV vol d, MOD A4C: 57.5 ml LV vol s, MOD A4C: 39.3 ml LV SV MOD A4C:     57.5 ml RIGHT VENTRICLE RV Basal diam:  5.10 cm RV Mid diam:    3.60 cm RV S prime:     10.40 cm/s TAPSE (M-mode): 1.5 cm LEFT ATRIUM              Index        RIGHT ATRIUM           Index LA diam:        4.10 cm  1.87 cm/m   RA Area:     37.80 cm LA Vol (A2C):   113.0 ml 51.43 ml/m  RA Volume:   165.00 ml 75.10 ml/m LA Vol (A4C):   73.0 ml  33.23 ml/m LA Biplane Vol: 95.8 ml  43.60 ml/m  AORTIC VALVE                    PULMONIC VALVE AV Area (Vmax):    2.02 cm     PV Vmax:       0.77 m/s AV Area (Vmean):   1.94 cm     PV Peak grad:  2.4 mmHg AV Area (VTI):     2.17 cm AV Vmax:           115.00 cm/s AV Vmean:          67.700 cm/s AV VTI:            0.240 m AV Peak Grad:      5.3 mmHg AV Mean Grad:      2.0 mmHg LVOT Vmax:         73.90 cm/s LVOT Vmean:        41.800 cm/s LVOT VTI:          0.166 m LVOT/AV VTI ratio: 0.69 AI PHT:            1164 msec  AORTA Ao Root diam: 3.50 cm Ao Asc diam:  3.30 cm MITRAL VALVE                TRICUSPID VALVE MV Area (PHT): 4.89 cm     TR Peak grad:   30.0 mmHg MV Decel Time: 155 msec     TR Vmax:        274.00 cm/s MV E velocity:  104.00 cm/s  SHUNTS                             Systemic VTI:  0.17 m                             Systemic Diam: 2.00 cm Riley LamMahesh Chandrasekhar MD Electronically signed by Riley LamMahesh Chandrasekhar MD Signature Date/Time: 05/12/2022/3:11:00 PM    Final     Cardiac Studies   Echocardiogram 05/12/22  1. Left ventricular ejection fraction, by estimation, is 50 to 55%. The  left ventricle has low normal function. The left ventricle demonstrates  global hypokinesis. Left ventricular diastolic parameters are  indeterminate.   2. Right ventricular systolic function is normal. The right ventricular  size is severely enlarged. Mildly increased right ventricular wall  thickness.   3. Left atrial size was severely dilated.   4. Right atrial size was severely dilated.   5. The mitral valve is grossly normal. No evidence of mitral valve  regurgitation. No evidence of mitral stenosis.   6. Tricuspid valve regurgitation is mild to moderate and atrial  functional in nature.   7. The aortic valve is tricuspid. There is moderate calcification of the  aortic valve. There is mild thickening of the aortic valve. Aortic valve  regurgitation is trivial. Aortic valve sclerosis is present, with no  evidence of aortic valve stenosis.   Comparison(s): Unable to access 2018 study, further atrial dilation from  prior report.   Patient Profile     88 y.o. male with longstanding persistent atrial fibrillaiton here with lower extremity edema, weakness, atrial fibrillation with bradycardia, moderate aortic regurgitation  Assessment & Plan    Persistent atrial fibrillation  Bradycardia  - Initial EKG and telemetry in ED showed HR in the 30s-40s - We are holding diltiazem 360 mg daily, has been held since 5/22 - Per telemetry, HR is now in the 70s-80s - Continue to hold diltiazem, if patient begins to have tachycardia or tachy/bradysyndrome, we will need to consider PPM  - Continue Eliquis 5 mg  BID    Acute on chronic diastolic heart failure  - Patient presented with significant lower extremity edema  - CXR on 5/22 showed pulmonary vascular congestion and bilateral lower lung atelectasis/consolidation/effusions  - Has been receiving IV lasix 60 mg BID since admission. Output 4.125 L urine yesterday and is net -6.2 L since admission.  - Creatinine slightly increased at 1.58 today (up from 1.35 on 5/23) this is not a significant bump as patient was receiving IV lasix for 2 days and BMP was not checked yesterday 5/24 - Patient continues to have significant lower extremity edema, crackles in lungs. Continue IV lasix today, and recheck renal function in the AM  - Question if patient could benefit from unno boots or ted hose given significant lower extremity edema, will discuss with MD  - Hold off on adding any nephrotoxic medications at this time to protect renal function during diuresis    Aortic Regurgitation  - Previously moderate in 2018 - Echocardiogram this admission showed trivial AR  - No further workup needed this admission, can continue to follow in the outpatient setting    Essential HTN   - Holding diltiazem due to bradycardia, BP stable  - Continue to hold losartan to protect renal function   For questions or updates, please contact [facility] Please consult [email or web address] for contact info under  Signed, Margie Billet, PA-C  05/14/2022, 8:39 AM    Personally seen and examined. Agree with above.  Creatinine slightly increased from 1.35 up to 1.58 with ongoing diuresis.  Bicarb are also increased from 25-30 2-35 showing some evidence of contraction.  We will give him 1 more day of IV Lasix and then convert him to p.o. tomorrow.  Leg edema seems to be continuing to improve.  This will be a long process however.  I agree with TED hose if possible.  Home health PT recommended by physical therapy.  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  He states that it is on [employer].  Candee Furbish, MD

## 2022-05-14 NOTE — Progress Notes (Signed)
PROGRESS NOTE    [name]  DJS:[id number] DOB: [birth year] DOA: 05/11/2022 PCP: Catha Gosselin, MD  Brief Narrative:  Barry Burgess is a 88 y.o. male with medical history significant of hyperlipidemia, hypertension, atrial fibrillation, BPH presenting with shortness of breath and edema. He was also found to be bradycardic and hypothermic.    Assessment and Plan:  Acute diastolic CHF -In the setting of A-fib with slow ventricular response, bradycardia -Echo this admission with EF of 50-55%, enlarged RV -Chest x-ray with pulmonary vascular congestion and effusion -Improving on diuretics, he is 6.2 L negative, mild bump in creatinine noted -Continue IV Lasix, add Unna boots Ambulate, PT OT eval completed, home health recommended   Persistent A-fib Bradycardia -Improved, heart rate in the 30s-40s on admission, diltiazem CD held-Heart rate improved now, continue Eliquis  AKI -Mild bump in creatinine to 1.5, baseline is close to 1, losartan on hold, monitor with diuresis  Hypertension -Stable, losartan and diltiazem on hold  Hyperlipidemia - Continue home pravastatin  BPH - Continue home finasteride   Hx of Cataracts Glaucoma - Continue home eyedrops  DVT prophylaxis: Place TED hose Start: 05/14/22 1027apixaban (ELIQUIS) tablet 5 mg    Code Status: Full Code Family Communication: Discussed with patient in detail, no family at bedside Disposition Plan: Home likely 48 hours  Consultants:  cards   Subjective: -Feels better overall, breathing is improving  Objective: Vitals:   05/14/22 0300 05/14/22 0800 05/14/22 0949 05/14/22 1130  BP: 132/74 (!) 117/103 101/63 107/77  Pulse: 80 73 94 99  Resp: 16 16 18  (!) 21  Temp: 97.8 F (36.6 C) (!) 97.4 F (36.3 C)  97.6 F (36.4 C)  TempSrc: Oral Oral  Oral  SpO2: 94% 93% 94% 94%  Weight:      Height:        Intake/Output Summary (Last 24 hours) at 05/14/2022 1237 Last data filed at 05/14/2022 1133 Gross per  24 hour  Intake 480 ml  Output 4775 ml  Net -4295 ml   Filed Weights   05/11/22 2000 05/12/22 1755 05/13/22 0108  Weight: 97.5 kg 93 kg 90.6 kg    Examination:   Gen: Pleasant elderly male sitting up in bed, AAOx3, no distress HEENT: Positive JVD CVS: S1-S2, irregularly irregular rhythm Lungs: Fine basilar rales Abdomen: Soft, nontender, bowel sounds present Extremities: 2+ edema with chronic skin changes Skin: As above      Data Reviewed: I have personally reviewed following labs and imaging studies  CBC: Recent Labs  Lab 05/11/22 1617 05/12/22 0316  WBC 4.5 5.8  NEUTROABS 3.4  --   HGB 12.5* 12.4*  HCT 39.9 37.8*  MCV 99.0 96.2  PLT 164 161   Basic Metabolic Panel: Recent Labs  Lab 05/11/22 1617 05/12/22 0316 05/14/22 0422  NA 141 145 137  K 4.0 4.2 3.7  CL 104 108 97*  CO2 25 32 35*  GLUCOSE 178* 126* 102*  BUN 18 18 20   CREATININE 1.35* 1.35* 1.58*  CALCIUM 8.8* 8.9 8.6*  MG 1.8  --  2.0   GFR: Estimated Creatinine Clearance: 32.1 mL/min (A) (by C-G formula based on SCr of 1.58 mg/dL (H)). Liver Function Tests: Recent Labs  Lab 05/12/22 0316  AST 23  ALT 20  ALKPHOS 69  BILITOT 0.9  PROT 6.1*  ALBUMIN 3.3*   No results for input(s): LIPASE, AMYLASE in the last 168 hours. No results for input(s): AMMONIA in the last 168 hours. Coagulation Profile: No results for  input(s): INR, PROTIME in the last 168 hours. Cardiac Enzymes: No results for input(s): CKTOTAL, CKMB, CKMBINDEX, TROPONINI in the last 168 hours. BNP (last 3 results) No results for input(s): PROBNP in the last 8760 hours. HbA1C: No results for input(s): HGBA1C in the last 72 hours. CBG: No results for input(s): GLUCAP in the last 168 hours. Lipid Profile: No results for input(s): CHOL, HDL, LDLCALC, TRIG, CHOLHDL, LDLDIRECT in the last 72 hours. Thyroid Function Tests: No results for input(s): TSH, T4TOTAL, FREET4, T3FREE, THYROIDAB in the last 72 hours. Anemia Panel: No  results for input(s): VITAMINB12, FOLATE, FERRITIN, TIBC, IRON, RETICCTPCT in the last 72 hours. Sepsis Labs: No results for input(s): PROCALCITON, LATICACIDVEN in the last 168 hours.  No results found for this or any previous visit (from the past 240 hour(s)).       Radiology Studies: ECHOCARDIOGRAM COMPLETE  Result Date: 05/12/2022    ECHOCARDIOGRAM REPORT   Patient Name:   Barry Burgess Date of Exam: 05/12/2022 Medical Rec #:  [id number]         Height:       72.0 in Accession #:    [id number]        Weight:       215.0 lb Date of Birth:  09-13-1926          BSA:          2.197 m Patient Age:    89 years          BP:           108/55 mmHg Patient Gender: M                 HR:           88 bpm. Exam Location:  Inpatient Procedure: 2D Echo, Cardiac Doppler and Color Doppler Indications:    AI  History:        Patient has prior history of Echocardiogram examinations, most                 recent 10/08/2017. Arrythmias:Atrial Fibrillation,                 Signs/Symptoms:Murmur; Risk Factors:Dyslipidemia. 11/13/2015                 cardioversion.  Sonographer:    Neomia Dear RDCS Referring Phys: [id number] Cecille Po MELVIN IMPRESSIONS  1. Left ventricular ejection fraction, by estimation, is 50 to 55%. The left ventricle has low normal function. The left ventricle demonstrates global hypokinesis. Left ventricular diastolic parameters are indeterminate.  2. Right ventricular systolic function is normal. The right ventricular size is severely enlarged. Mildly increased right ventricular wall thickness.  3. Left atrial size was severely dilated.  4. Right atrial size was severely dilated.  5. The mitral valve is grossly normal. No evidence of mitral valve regurgitation. No evidence of mitral stenosis.  6. Tricuspid valve regurgitation is mild to moderate and atrial functional in nature.  7. The aortic valve is tricuspid. There is moderate calcification of the aortic valve. There is mild thickening of the  aortic valve. Aortic valve regurgitation is trivial. Aortic valve sclerosis is present, with no evidence of aortic valve stenosis. Comparison(s): Unable to access 2018 study, further atrial dilation from prior report. FINDINGS  Left Ventricle: Left ventricular ejection fraction, by estimation, is 50 to 55%. The left ventricle has low normal function. The left ventricle demonstrates global hypokinesis. The left ventricular internal cavity size was normal in size. There  is no left ventricular hypertrophy. Left ventricular diastolic parameters are indeterminate. Right Ventricle: The right ventricular size is severely enlarged. Mildly increased right ventricular wall thickness. Right ventricular systolic function is normal. Left Atrium: Left atrial size was severely dilated. Right Atrium: Right atrial size was severely dilated. Pericardium: There is no evidence of pericardial effusion. Mitral Valve: The mitral valve is grossly normal. No evidence of mitral valve regurgitation. No evidence of mitral valve stenosis. Tricuspid Valve: The tricuspid valve is grossly normal. Tricuspid valve regurgitation is mild to moderate. Aortic Valve: The aortic valve is tricuspid. There is moderate calcification of the aortic valve. There is mild thickening of the aortic valve. Aortic valve regurgitation is trivial. Aortic regurgitation PHT measures 1164 msec. Aortic valve sclerosis is present, with no evidence of aortic valve stenosis. Aortic valve mean gradient measures 2.0 mmHg. Aortic valve peak gradient measures 5.3 mmHg. Aortic valve area, by VTI measures 2.17 cm. Pulmonic Valve: The pulmonic valve was normal in structure. Pulmonic valve regurgitation is not visualized. No evidence of pulmonic stenosis. Aorta: The aortic root and ascending aorta are structurally normal, with no evidence of dilitation. IAS/Shunts: No atrial level shunt detected by color flow Doppler.  LEFT VENTRICLE PLAX 2D LVIDd:         4.70 cm LVIDs:          3.30 cm LV PW:         0.80 cm LV IVS:        1.00 cm LVOT diam:     2.00 cm LV SV:         52 LV SV Index:   24 LVOT Area:     3.14 cm  LV Volumes (MOD) LV vol d, MOD A4C: 57.5 ml LV vol s, MOD A4C: 39.3 ml LV SV MOD A4C:     57.5 ml RIGHT VENTRICLE RV Basal diam:  5.10 cm RV Mid diam:    3.60 cm RV S prime:     10.40 cm/s TAPSE (M-mode): 1.5 cm LEFT ATRIUM              Index        RIGHT ATRIUM           Index LA diam:        4.10 cm  1.87 cm/m   RA Area:     37.80 cm LA Vol (A2C):   113.0 ml 51.43 ml/m  RA Volume:   165.00 ml 75.10 ml/m LA Vol (A4C):   73.0 ml  33.23 ml/m LA Biplane Vol: 95.8 ml  43.60 ml/m  AORTIC VALVE                    PULMONIC VALVE AV Area (Vmax):    2.02 cm     PV Vmax:       0.77 m/s AV Area (Vmean):   1.94 cm     PV Peak grad:  2.4 mmHg AV Area (VTI):     2.17 cm AV Vmax:           115.00 cm/s AV Vmean:          67.700 cm/s AV VTI:            0.240 m AV Peak Grad:      5.3 mmHg AV Mean Grad:      2.0 mmHg LVOT Vmax:         73.90 cm/s LVOT Vmean:        41.800 cm/s LVOT VTI:  0.166 m LVOT/AV VTI ratio: 0.69 AI PHT:            1164 msec  AORTA Ao Root diam: 3.50 cm Ao Asc diam:  3.30 cm MITRAL VALVE                TRICUSPID VALVE MV Area (PHT): 4.89 cm     TR Peak grad:   30.0 mmHg MV Decel Time: 155 msec     TR Vmax:        274.00 cm/s MV E velocity: 104.00 cm/s                             SHUNTS                             Systemic VTI:  0.17 m                             Systemic Diam: 2.00 cm Riley LamMahesh Chandrasekhar MD Electronically signed by Riley LamMahesh Chandrasekhar MD Signature Date/Time: 05/12/2022/3:11:00 PM    Final         Scheduled Meds:  apixaban  5 mg Oral BID   dorzolamide-timolol  1 drop Both Eyes BID   finasteride  5 mg Oral Daily   furosemide  60 mg Intravenous BID   latanoprost  1 drop Both Eyes q morning   nystatin cream   Topical BID   pravastatin  40 mg Oral QHS   sodium chloride flush  3 mL Intravenous Q12H   Continuous Infusions:   LOS:  2 days    Time spent: 30min   Zannie CovePreetha Briana Farner, MD [facility]  05/14/2022, 12:37 PM

## 2022-05-14 NOTE — Plan of Care (Signed)
  Problem: Activity: Goal: Capacity to carry out activities will improve Outcome: Progressing   Problem: Activity: Goal: Capacity to carry out activities will improve Outcome: Progressing   

## 2022-05-14 NOTE — Evaluation (Signed)
Occupational Therapy Evaluation Patient Details Name: Barry Burgess MRN: [id number] DOB: 04/06/1926 Today's Date: 05/14/2022   History of Present Illness 88 y/o male admitted 5/22 secondary to LE swelling and SOB with CHF exacerbation. PMHx:HTN, A fib, CHF, and glaucoma.   Clinical Impression   Pt admitted as above presenting with deficits as listed below (refer to OT problem list). Pt seen for OT assessment and basic ADLs sitting up in chair. Pt was educated in role of OT and is currently Min A for functional mobility and transfers and reports supportive family whom he lives with. He expressed recent difficulty with functional mobility and transfers [facility] Plans to d/c home when medically able. Will follow acutely to assist in maximizing independence with functional mobility and transfers as well as pt education and activity tolerance.     Recommendations for follow up therapy are one component of a multi-disciplinary discharge planning process, led by the attending physician.  Recommendations may be updated based on patient status, additional functional criteria and insurance authorization.   Follow Up Recommendations  Home health OT    Assistance Recommended at Discharge Intermittent Supervision/Assistance  Patient can return home with the following A little help with walking and/or transfers;A little help with bathing/dressing/bathroom;Assistance with cooking/housework    Functional Status Assessment  Patient has had a recent decline in their functional status and demonstrates the ability to make significant improvements in function in a reasonable and predictable amount of time.  Equipment Recommendations  BSC/3in1    Recommendations for Other Services       Precautions / Restrictions Precautions Precautions: Fall Restrictions Weight Bearing Restrictions: No      Mobility Bed Mobility Overal bed mobility: Needs Assistance Bed Mobility: Supine to Sit      Supine to sit: Min assist     General bed mobility comments: Min A for LE assist to perform bed mobility tasks.    Transfers Overall transfer level: Needs assistance Equipment used: Rolling walker (2 wheels) Transfers: Sit to/from Stand, Bed to chair/wheelchair/BSC Sit to Stand: Min assist, From elevated surface     Step pivot transfers: Min assist, From elevated surface     General transfer comment: Min A for steadying assist to stand. Heavy use of UEs to stand from EOB      Balance Overall balance assessment: Needs assistance Sitting-balance support: No upper extremity supported, Feet supported Sitting balance-Leahy Scale: Fair     Standing balance support: Bilateral upper extremity supported, Reliant on assistive device for balance Standing balance-Leahy Scale: Poor Standing balance comment: Reliant on BUE support     ADL either performed or assessed with clinical judgement   ADL Overall ADL's : Needs assistance/impaired Eating/Feeding: Set up;Sitting;Bed level;With caregiver independent assisting   Grooming: Wash/dry hands;Wash/dry face;Oral care;Set up;Sitting   Upper Body Bathing: Set up;Sitting   Lower Body Bathing: Minimal assistance;Sit to/from stand;Sitting/lateral leans   Upper Body Dressing : Min guard;Sitting   Lower Body Dressing: Moderate assistance;Sitting/lateral leans;Sit to/from stand   Toilet Transfer: Minimal assistance;Ambulation;BSC/3in1;Rolling walker (2 wheels) Toilet Transfer Details (indicate cue type and reason): Simulated - transfer from bed to recliner chair with step pivot transfer Toileting- Clothing Manipulation and Hygiene: Sitting/lateral lean;Sit to/from stand;Min guard     Tub/Shower Transfer Details (indicate cue type and reason): TBD Functional mobility during ADLs: Minimal assistance;Rolling walker (2 wheels);Cueing for safety General ADL Comments: Pt seen for OT assessment and basic ADLs sitting up in chair. Pt was  educated in role of OT  and is currently Min A for functional mobility and transfers and reports supportive family whom he lives with. He expressed recent difficulty with functional mobility and transfers and is agreeable to [facility]. Plans to d/c home when medically able. Will follow acutely to assist in maximizing independence with functional mobility and transfers as well as activity tolerance.     Vision Baseline Vision/History: 1 Wears glasses Patient Visual Report: No change from baseline              Pertinent Vitals/Pain Pain Assessment Pain Assessment: No/denies pain     Hand Dominance Right   Extremity/Trunk Assessment Upper Extremity Assessment Upper Extremity Assessment: Generalized weakness;Overall [facility] for tasks assessed   Lower Extremity Assessment Lower Extremity Assessment: Defer to PT evaluation;Generalized weakness   Cervical / Trunk Assessment Cervical / Trunk Assessment: Kyphotic   Communication Communication Communication: HOH   Cognition Arousal/Alertness: Awake/alert Behavior During Therapy: WFL for tasks assessed/performed Overall Cognitive Status: Within Functional Limits for tasks assessed     General Comments: Can be repetitive with questions ? secondary to [facility]     General Comments   Pt is HOH, enjoys talking about his family and upbringing            Home Living Family/patient expects to be discharged to:: Private residence Living Arrangements: Children Available Help at Discharge: Family Type of Home: House Home Access: Stairs to enter Secretary/administrator of Steps: 2-3 Entrance Stairs-Rails: Left Home Layout: One level     Bathroom Shower/Tub: Producer, television/film/video: Standard     Home Equipment: Other (comment);Wheelchair - Forensic psychologist (2 wheels) (Other (comment); Wheelchair - Tour manager (2 wheels) Bars over toilet)   Additional Comments: Pt reports unsteady of late with difficulty getting into  bathroom      Prior Functioning/Environment Prior Level of Function : Needs assist     Mobility Comments: Reports he normally uses RW for mobility tasks ADLs Comments: Reports he normally requires assist for ADL tasks - son assists PRN with bathing/dressing        OT Problem List: Decreased knowledge of use of DME or AE;Decreased knowledge of precautions;Decreased activity tolerance;Cardiopulmonary status limiting activity;Impaired balance (sitting and/or standing)      OT Treatment/Interventions: Self-care/ADL training;Patient/family education;Energy conservation;Therapeutic activities;DME and/or AE instruction    OT Goals(Current goals can be found in the care plan section) Acute Rehab OT Goals Patient Stated Goal: Go home when able OT Goal Formulation: With patient Time For Goal Achievement: 05/28/22 Potential to Achieve Goals: Fair  OT Frequency: Min 2X/week       AM-PAC OT "6 Clicks" Daily Activity     Outcome Measure Help from another person eating meals?: None Help from another person taking care of personal grooming?: A Little Help from another person toileting, which includes using toliet, bedpan, or urinal?: A Little Help from another person bathing (including washing, rinsing, drying)?: A Little Help from another person to put on and taking off regular upper body clothing?: A Little Help from another person to put on and taking off regular lower body clothing?: A Little 6 Click Score: 19   End of Session Equipment Utilized During Treatment: Gait belt;Rolling walker (2 wheels) Nurse Communication: Mobility status  Activity Tolerance: Patient tolerated treatment well Patient left: in chair;with call bell/phone within reach  OT Visit Diagnosis: Unsteadiness on feet (R26.81);Muscle weakness (generalized) (M62.81)                Time: 9935-7017 OT Time Calculation (  min): 28 min Charges:  OT General Charges $OT Visit: 1 Visit OT Evaluation $OT Eval Moderate  Complexity: 1 Mod  Lataysha Vohra Beth Dixon, OTR/L 05/14/2022, 10:03 AM

## 2022-05-14 NOTE — Progress Notes (Signed)
Physical Therapy Treatment Patient Details Name: Barry Burgess MRN: [id number] DOB: July 20, 1926 Today's Date: 05/14/2022   History of Present Illness 88 y/o male admitted 5/22 secondary to LE swelling and SOB with CHF exacerbation. PMHx:HTN, A fib, CHF, and glaucoma.    PT Comments    Pt received OOB in recliner on arrival and agreeable to session with progress towards goals. Pt able to demonstrate increased tolerance for ambulation with RW with no LOB, some noted unsteadiness most notably when negotiating obstacles needing min assist to correct. Pt requiring cues for pacing throughout ambulation with HR max 132bpm during. Education reiterated and reinforced with pt and family re; daily weights, nutrition, skin checks, importance of continued mobility and activity recommendations, pt and family verbalizing understanding. Pt continues to benefit from skilled PT services to progress toward functional mobility goals.    HR at rest: 88bpm HR max during ambulation: 132bpm   Recommendations for follow up therapy are one component of a multi-disciplinary discharge planning process, led by the attending physician.  Recommendations may be updated based on patient status, additional functional criteria and insurance authorization.  Follow Up Recommendations  Home health PT     Assistance Recommended at Discharge Frequent or constant Supervision/Assistance  Patient can return home with the following A little help with walking and/or transfers;A little help with bathing/dressing/bathroom;Assistance with cooking/housework;Assist for transportation;Help with stairs or ramp for entrance   Equipment Recommendations  None recommended by PT    Recommendations for Other Services       Precautions / Restrictions Precautions Precautions: Fall Restrictions Weight Bearing Restrictions: No     Mobility  Bed Mobility Overal bed mobility: Needs Assistance             General bed mobility  comments: OOB in recliner on arrival    Transfers Overall transfer level: Needs assistance Equipment used: Rolling walker (2 wheels) Transfers: Sit to/from Stand, Bed to chair/wheelchair/BSC Sit to Stand: Min assist           General transfer comment: Min A for steadying assist to stand. Heavy use of UEs to stand from recliner    Ambulation/Gait Ambulation/Gait assistance: Min assist Gait Distance (Feet): 120 Feet Assistive device: Rolling walker (2 wheels) Gait Pattern/deviations: Step-through pattern, Decreased stride length, Trunk flexed Gait velocity: Decreased     General Gait Details: min asssit for RW negotiation around obstacles in room, cues for pacing and upright posture   Stairs             Wheelchair Mobility    Modified Rankin (Stroke Patients Only)       Balance Overall balance assessment: Needs assistance Sitting-balance support: No upper extremity supported, Feet supported Sitting balance-Leahy Scale: Fair     Standing balance support: Bilateral upper extremity supported, Reliant on assistive device for balance Standing balance-Leahy Scale: Poor Standing balance comment: Reliant on BUE support                            Cognition Arousal/Alertness: Awake/alert Behavior During Therapy: WFL for tasks assessed/performed Overall Cognitive Status: Within Functional Limits for tasks assessed                                 General Comments: HOH        Exercises      General Comments General comments (skin integrity, edema, etc.): HR at rest: 88bpm, HR  max during ambulation 132bpm, no c/o SOB      Pertinent Vitals/Pain      Home Living Family/patient expects to be discharged to:: Private residence Living Arrangements: Children Available Help at Discharge: Family Type of Home: House Home Access: Stairs to enter Entrance Stairs-Rails: Left Entrance Stairs-Number of Steps: 2-3   Home Layout: One  level Home Equipment: Other (comment);Wheelchair - Publishing copy (2 wheels) (Other (comment); Wheelchair - manual; Conservation officer, nature (2 wheels) Bars over toilet) Additional Comments: Pt reports unsteady of late with difficulty getting into bathroom    Prior Function            PT Goals (current goals can now be found in the care plan section) Acute Rehab PT Goals Patient Stated Goal: to go home PT Goal Formulation: With patient Time For Goal Achievement: 05/27/22    Frequency    Min 3X/week      PT Plan      Co-evaluation              AM-PAC PT "6 Clicks" Mobility   Outcome Measure  Help needed turning from your back to your side while in a flat bed without using bedrails?: A Little Help needed moving from lying on your back to sitting on the side of a flat bed without using bedrails?: A Little Help needed moving to and from a bed to a chair (including a wheelchair)?: A Little Help needed standing up from a chair using your arms (e.g., wheelchair or bedside chair)?: A Little Help needed to walk in hospital room?: A Little Help needed climbing 3-5 steps with a railing? : A Lot 6 Click Score: 17    End of Session Equipment Utilized During Treatment: Gait belt Activity Tolerance: Patient tolerated treatment well Patient left: with call bell/phone within reach;with family/visitor present;in chair Nurse Communication: Mobility status PT Visit Diagnosis: Unsteadiness on feet (R26.81);Muscle weakness (generalized) (M62.81);Difficulty in walking, not elsewhere classified (R26.2)     Time: 1103-1130 PT Time Calculation (min) (ACUTE ONLY): 27 min  Charges:  $Gait Training: 8-22 mins $Therapeutic Activity: 8-22 mins                     Uzziah Rigg R. PTA Acute Rehabilitation Services Office: [place] 05/14/2022, 12:00 PM

## 2022-05-14 NOTE — Progress Notes (Signed)
Heart Failure Nurse Navigator Progress Note  PCP: Catha Gosselin, MD PCP-Cardiologist: Eden Emms Admission Diagnosis: Sinus bradycardia, Bilateral leg edema, Hypoxia, Acute pulmonary edema, Acute renal failure.  Admitted from: Home  Presentation:   Barry Burgess presented with shortness of breath and leg pain over 1 week, Bradycardia. BP 112/50, HR 42, superficial wound 2 cm on right lower extremity. CXR showed bilateral pleural effusions, IV lasix ordered, patient takes all medications as prescribed lives with his son in [place]. Up until 2 weeks ago he was able to ride his exercise bike, now his legs hurt and he is short of breath.   [name] were educated on the sign and symptoms of heart failure, daily weights, when to call his doctor or go to the ER. Diet/ fluid restrictions, medication compliance and importance of attending his medical appointment. They were very interactive and asked questions, they voiced their understanding of the education and patient will have a hospital follow up in the HF TOC on 05/26/22 @ 10 am.   ECHO/ LVEF: 50-55%  Clinical Course:  Past Medical History:  Diagnosis Date   Aortic insufficiency    Atrial fibrillation with RVR (HCC) 10/14/2015   BPH (benign prostatic hyperplasia)    Glaucoma    Hyperlipidemia    Hypertension    Mitral regurgitation    PAF (paroxysmal atrial fibrillation) (HCC)    Tricuspid regurgitation      Social History   Socioeconomic History   Marital status: Married    Spouse name: Not on file   Number of children: Not on file   Years of education: Not on file   Highest education level: Not on file  Occupational History   Occupation: Retired  Tobacco Use   Smoking status: Former    Types: Pipe, Cigars    Quit date: 10/13/1962    Years since quitting: 59.6   Smokeless tobacco: Never  Vaping Use   Vaping Use: Never used  Substance and Sexual Activity   Alcohol use: Not Currently   Drug use:  No   Sexual activity: Not on file  Other Topics Concern   Not on file  Social History Narrative   Not on file   Social Determinants of Health   Financial Resource Strain: Low Risk    Difficulty of Paying Living Expenses: Not hard at all  Food Insecurity: No Food Insecurity   Worried About Programme researcher, broadcasting/film/video in the Last Year: Never true   Ran Out of Food in the Last Year: Never true  Transportation Needs: No Transportation Needs   Lack of Transportation (Medical): No   Lack of Transportation (Non-Medical): No  Physical Activity: Not on file  Stress: Not on file  Social Connections: Not on file   Education Assessment and Provision:  Detailed education and instructions provided on heart failure disease management including the following:  Signs and symptoms of Heart Failure When to call the physician Importance of daily weights Low sodium diet Fluid restriction Medication management Anticipated future follow-up appointments  Patient education given on each of the above topics.  Patient acknowledges understanding via teach back method and acceptance of all instructions.  Education Materials:  "Living Better With Heart Failure" Booklet, HF zone tool, & Daily Weight Tracker Tool.  Patient has scale at home: yes Patient has pill box at home: yes    High Risk Criteria for Readmission and/or Poor Patient Outcomes: Heart failure hospital admissions (last 6 months): 0  No  Show rate: 6 % Difficult social situation: No Demonstrates medication adherence: Yes Primary Language: English Literacy level: Reading, writing, and comprehension, Hard of hearing.   Barriers of Care:   HOH  Considerations/Referrals:   Referral made to Heart Failure Pharmacist Stewardship:  Referral made to Heart Failure CSW/NCM TOC: yes Referral made to Heart & Vascular TOC clinic: Yes, 05/26/22 @ 10 am (per Dr. Broadus John)  Items for Follow-up on DC/TOC: Hospital F/U (per Dr. Broadus John) Diet/ fluids Daily  weights   Earnestine Leys, BSN, RN Heart Failure Nurse Navigator Secure Chat Only

## 2022-05-15 ENCOUNTER — Other Ambulatory Visit (HOSPITAL_COMMUNITY): Payer: Self-pay

## 2022-05-15 DIAGNOSIS — J810 Acute pulmonary edema: Secondary | ICD-10-CM | POA: Diagnosis not present

## 2022-05-15 DIAGNOSIS — N179 Acute kidney failure, unspecified: Secondary | ICD-10-CM | POA: Diagnosis not present

## 2022-05-15 LAB — BASIC METABOLIC PANEL
Anion gap: 11 (ref 5–15)
BUN: 24 mg/dL — ABNORMAL HIGH (ref 8–23)
CO2: 33 mmol/L — ABNORMAL HIGH (ref 22–32)
Calcium: 8.7 mg/dL — ABNORMAL LOW (ref 8.9–10.3)
Chloride: 95 mmol/L — ABNORMAL LOW (ref 98–111)
Creatinine, Ser: 1.35 mg/dL — ABNORMAL HIGH (ref 0.61–1.24)
GFR, Estimated: 49 mL/min — ABNORMAL LOW (ref >60)
Glucose, Bld: 99 mg/dL (ref 70–99)
Potassium: 3.5 mmol/L (ref 3.5–5.1)
Sodium: 139 mmol/L (ref 135–145)

## 2022-05-15 MED ORDER — FUROSEMIDE 40 MG PO TABS
40.0000 mg | ORAL_TABLET | Freq: Two times a day (BID) | ORAL | 0 refills | Status: DC
  Filled 2022-05-15: qty 60, 30d supply, fill #0

## 2022-05-15 MED ORDER — FUROSEMIDE 40 MG PO TABS
40.0000 mg | ORAL_TABLET | Freq: Two times a day (BID) | ORAL | Status: DC
  Administered 2022-05-15: 40 mg via ORAL
  Filled 2022-05-15: qty 1

## 2022-05-15 NOTE — Progress Notes (Signed)
Mobility Specialist Progress Note:   05/15/22 1226  Mobility  Activity Stood at bedside;Dangled on edge of bed  Level of Assistance Minimal assist, patient does 75% or more  Assistive Device  (HHA)  Distance Ambulated (ft) 2 ft  Activity Response Tolerated well  $Mobility charge 1 Mobility   Pt received in chair needing to gt dressed for discharge. No complaints of pain. MinA to stand from chair. MinA to get clothes on. Left in chair with call bell in reach and all needs met.    [facility] [place] Mobility Specialist

## 2022-05-15 NOTE — Discharge Summary (Signed)
Physician Discharge Summary  Barry Burgess DOB: 06-10-1926 DOA: 05/11/2022  PCP: Hulan Fess, MD  Admit date: 05/11/2022 Discharge date: 05/15/2022  Time spent: 35 minutes  Recommendations for Outpatient Follow-up:  Cardiology Dr.Nishan in 2 weeks PCP in 1 week, please check BMP at follow-up Home health PT RN, please remove Unna boots on Monday 5/29   Discharge Diagnoses:  Principal Problem: Acute on chronic diastolic CHF Atrial fibrillation with slow ventricular response   HTN (hypertension)   HLD (hyperlipidemia)   BPH (benign prostatic hyperplasia)   Atrial fibrillation with slow ventricular response (HCC)   AKI (acute kidney injury) ([place])   Discharge Condition: Stable  Diet recommendation: Low-sodium, heart healthy  Filed Weights   05/13/22 0108 05/14/22 0500 05/15/22 0536  Weight: 90.6 kg 90.6 kg 83.7 kg    History of present illness:  Barry Burgess is a 88 y.o. male with medical history significant of hyperlipidemia, hypertension, atrial fibrillation, BPH presenting with shortness of breath and edema. He was also found to be bradycardic and hypothermic.    Hospital Course:   Acute diastolic CHF -In the setting of A-fib with slow ventricular response, bradycardia -Echo this admission with EF of 50-55%, enlarged RV -Chest x-ray with pulmonary vascular congestion and effusion -Improved with diuresis, he is 9 L negative, weight is down 30lbs -We placed Unna boots on the patient, this can be removed on Monday by home health RN -Seen by cardiology in consultation, transition to oral Lasix 40 Mg twice daily, losartan discontinued on account of soft blood pressures -Close follow-up with Dr. [name] -Home health services set up at discharge   Persistent A-fib Bradycardia -Improved, heart rate in the 30s-40s on admission, diltiazem CD held-Heart rate improved now, continue Eliquis -Follow-up with cardiology   AKI -Mild bump in creatinine to 1.5,  baseline is close to 1, losartan held, now stable, creatinine 1.3 at discharge  Hypertension -Stable, losartan and diltiazem on hold  Hyperlipidemia - Continue home pravastatin  BPH - Continue home finasteride   Hx of Cataracts Glaucoma - Continue home eyedrops    Consultants:  cards  Discharge Exam: Vitals:   05/15/22 0358 05/15/22 0720  BP: 133/84 120/75  Pulse: 88 79  Resp: 12 14  Temp: (!) 97.5 F (36.4 C) (!) 97.4 F (36.3 C)  SpO2: 98% 97%  Gen: Pleasant elderly male sitting up in bed, AAOx3, no distress HEENT: Positive JVD CVS: S1-S2, irregularly irregular rhythm Lungs: Fine basilar rales Abdomen: Soft, nontender, bowel sounds present Extremities: 1+ edema chronic skin changes, Unna boots on Skin: As above   Discharge Instructions   Discharge Instructions     Diet - low sodium heart healthy   Complete by: As directed    Increase activity slowly   Complete by: As directed       Allergies as of 05/15/2022   No Known Allergies      Medication List     STOP taking these medications    diltiazem 360 MG 24 hr capsule Commonly known as: CARDIZEM CD   losartan 25 MG tablet Commonly known as: COZAAR       TAKE these medications    dorzolamide-timolol 22.3-6.8 MG/ML ophthalmic solution Commonly known as: COSOPT Place 1 drop into both eyes 2 (two) times daily.   doxazosin 4 MG tablet Commonly known as: CARDURA Take 4 mg by mouth daily.   Eliquis 5 MG Tabs tablet Generic drug: apixaban TAKE 1 TABLET BY MOUTH  TWICE DAILY What changed: how  much to take   finasteride 5 MG tablet Commonly known as: PROSCAR Take 5 mg by mouth daily.   furosemide 40 MG tablet Commonly known as: LASIX Take 1 tablet (40 mg total) by mouth 2 (two) times daily. What changed: when to take this   latanoprost 0.005 % ophthalmic solution Commonly known as: XALATAN Place 1 drop into both eyes every morning.   pravastatin 80 MG tablet Commonly known as:  PRAVACHOL Take 40 mg by mouth daily.       No Known Allergies  Follow-up Information     [facility], [facility] Follow up.   Specialty: Home Health Services Why: HHPT,- Agency will call you to schedule apt times Contact information: [address]  [place] [address] [phone]         [facility]. Go in 10 day(s).   Specialty: Cardiology Why: Hospital follow up PLEASE bring current medication list to appointment FREE valet parking, Entrance C, off [employer] information: [address] [id number] Adrian Marrero [phone]                 The results of significant diagnostics from this hospitalization (including imaging, microbiology, ancillary and laboratory) are listed below for reference.    Significant Diagnostic Studies: DG Chest Portable 1 View  Result Date: 05/11/2022 CLINICAL DATA:  Shortness of breath and leg swelling. EXAM: PORTABLE CHEST 1 VIEW COMPARISON:  10/14/2015 FINDINGS: Cardiomegaly and pulmonary vascular congestion noted. Mild interstitial opacities are present. Bilateral LOWER lung opacities are noted and may represent atelectasis/consolidation/effusions are present. No pneumothorax or acute bony abnormality. IMPRESSION: 1. Cardiomegaly with pulmonary vascular congestion and probable mild interstitial edema. 2. Bilateral LOWER lung atelectasis/consolidation/effusions. Electronically Signed   By: Margarette Canada M.D.   On: 05/11/2022 16:51   ECHOCARDIOGRAM COMPLETE  Result Date: 05/12/2022    ECHOCARDIOGRAM REPORT   Patient Name:   Barry Burgess Date of Exam: 05/12/2022 Medical Rec #:  [id number]         Height:       72.0 in Accession #:    [id number]        Weight:       215.0 lb Date of Birth:  [birth year]          BSA:          2.197 m Patient Age:    81 years          BP:           108/55 mmHg Patient Gender: M                 HR:           88  bpm. Exam Location:  Inpatient Procedure: 2D Echo, Cardiac Doppler and Color Doppler Indications:    AI  History:        Patient has prior history of Echocardiogram examinations, most                 recent 10/08/2017. Arrythmias:Atrial Fibrillation,                 Signs/Symptoms:Murmur; Risk Factors:Dyslipidemia. 11/13/2015                 cardioversion.  Sonographer:    Byron Referring Phys: [id number] [place]  1. Left ventricular ejection fraction, by estimation, is 50 to 55%. The left ventricle has low normal function. The left  ventricle demonstrates global hypokinesis. Left ventricular diastolic parameters are indeterminate.  2. Right ventricular systolic function is normal. The right ventricular size is severely enlarged. Mildly increased right ventricular wall thickness.  3. Left atrial size was severely dilated.  4. Right atrial size was severely dilated.  5. The mitral valve is grossly normal. No evidence of mitral valve regurgitation. No evidence of mitral stenosis.  6. Tricuspid valve regurgitation is mild to moderate and atrial functional in nature.  7. The aortic valve is tricuspid. There is moderate calcification of the aortic valve. There is mild thickening of the aortic valve. Aortic valve regurgitation is trivial. Aortic valve sclerosis is present, with no evidence of aortic valve stenosis. Comparison(s): Unable to access 2018 study, further atrial dilation from prior report. FINDINGS  Left Ventricle: Left ventricular ejection fraction, by estimation, is 50 to 55%. The left ventricle has low normal function. The left ventricle demonstrates global hypokinesis. The left ventricular internal cavity size was normal in size. There is no left ventricular hypertrophy. Left ventricular diastolic parameters are indeterminate. Right Ventricle: The right ventricular size is severely enlarged. Mildly increased right ventricular wall thickness. Right ventricular systolic function  is normal. Left Atrium: Left atrial size was severely dilated. Right Atrium: Right atrial size was severely dilated. Pericardium: There is no evidence of pericardial effusion. Mitral Valve: The mitral valve is grossly normal. No evidence of mitral valve regurgitation. No evidence of mitral valve stenosis. Tricuspid Valve: The tricuspid valve is grossly normal. Tricuspid valve regurgitation is mild to moderate. Aortic Valve: The aortic valve is tricuspid. There is moderate calcification of the aortic valve. There is mild thickening of the aortic valve. Aortic valve regurgitation is trivial. Aortic regurgitation PHT measures 1164 msec. Aortic valve sclerosis is present, with no evidence of aortic valve stenosis. Aortic valve mean gradient measures 2.0 mmHg. Aortic valve peak gradient measures 5.3 mmHg. Aortic valve area, by VTI measures 2.17 cm. Pulmonic Valve: The pulmonic valve was normal in structure. Pulmonic valve regurgitation is not visualized. No evidence of pulmonic stenosis. Aorta: The aortic root and ascending aorta are structurally normal, with no evidence of dilitation. IAS/Shunts: No atrial level shunt detected by color flow Doppler.  LEFT VENTRICLE PLAX 2D LVIDd:         4.70 cm LVIDs:         3.30 cm LV PW:         0.80 cm LV IVS:        1.00 cm LVOT diam:     2.00 cm LV SV:         52 LV SV Index:   24 LVOT Area:     3.14 cm  LV Volumes (MOD) LV vol d, MOD A4C: 57.5 ml LV vol s, MOD A4C: 39.3 ml LV SV MOD A4C:     57.5 ml RIGHT VENTRICLE RV Basal diam:  5.10 cm RV Mid diam:    3.60 cm RV S prime:     10.40 cm/s TAPSE (M-mode): 1.5 cm LEFT ATRIUM              Index        RIGHT ATRIUM           Index LA diam:        4.10 cm  1.87 cm/m   RA Area:     37.80 cm LA Vol (A2C):   113.0 ml 51.43 ml/m  RA Volume:   165.00 ml 75.10 ml/m LA Vol (A4C):   73.0 ml  33.23 ml/m LA Biplane Vol: 95.8 ml  43.60 ml/m  AORTIC VALVE                    PULMONIC VALVE AV Area (Vmax):    2.02 cm     PV Vmax:        0.77 m/s AV Area (Vmean):   1.94 cm     PV Peak grad:  2.4 mmHg AV Area (VTI):     2.17 cm AV Vmax:           115.00 cm/s AV Vmean:          67.700 cm/s AV VTI:            0.240 m AV Peak Grad:      5.3 mmHg AV Mean Grad:      2.0 mmHg LVOT Vmax:         73.90 cm/s LVOT Vmean:        41.800 cm/s LVOT VTI:          0.166 m LVOT/AV VTI ratio: 0.69 AI PHT:            1164 msec  AORTA Ao Root diam: 3.50 cm Ao Asc diam:  3.30 cm MITRAL VALVE                TRICUSPID VALVE MV Area (PHT): 4.89 cm     TR Peak grad:   30.0 mmHg MV Decel Time: 155 msec     TR Vmax:        274.00 cm/s MV E velocity: 104.00 cm/s                             SHUNTS                             Systemic VTI:  0.17 m                             Systemic Diam: 2.00 cm Rudean Haskell MD Electronically signed by Rudean Haskell MD Signature Date/Time: 05/12/2022/3:11:00 PM    Final     Microbiology: No results found for this or any previous visit (from the past 240 hour(s)).   Labs: Basic Metabolic Panel: Recent Labs  Lab 05/11/22 1617 05/12/22 0316 05/14/22 0422 05/15/22 0345  NA 141 145 137 139  K 4.0 4.2 3.7 3.5  CL 104 108 97* 95*  CO2 25 32 35* 33*  GLUCOSE 178* 126* 102* 99  BUN 18 18 20  24*  CREATININE 1.35* 1.35* 1.58* 1.35*  CALCIUM 8.8* 8.9 8.6* 8.7*  MG 1.8  --  2.0  --    Liver Function Tests: Recent Labs  Lab 05/12/22 0316  AST 23  ALT 20  ALKPHOS 69  BILITOT 0.9  PROT 6.1*  ALBUMIN 3.3*   No results for input(s): LIPASE, AMYLASE in the last 168 hours. No results for input(s): AMMONIA in the last 168 hours. CBC: Recent Labs  Lab 05/11/22 1617 05/12/22 0316  WBC 4.5 5.8  NEUTROABS 3.4  --   HGB 12.5* 12.4*  HCT 39.9 37.8*  MCV 99.0 96.2  PLT 164 161   Cardiac Enzymes: No results for input(s): CKTOTAL, CKMB, CKMBINDEX, TROPONINI in the last 168 hours. BNP: BNP (last 3 results) No results for input(s): BNP in the last 8760 hours.  ProBNP (  last 3 results) No results for  input(s): PROBNP in the last 8760 hours.  CBG: No results for input(s): GLUCAP in the last 168 hours.     Signed:  Domenic Polite MD.  [facility] 05/15/2022, 10:20 AM

## 2022-05-15 NOTE — Progress Notes (Signed)
Occupational Therapy Treatment Patient Details Name: Barry Burgess MRN: [id number] DOB: 24-Oct-1926 Today's Date: 05/15/2022   History of present illness 88 y/o male admitted 5/22 secondary to LE swelling and SOB with CHF exacerbation. PMHx:HTN, A fib, CHF, and glaucoma.   OT comments  Patient received in bed and eager to participate. Patient was min guard to get to EOB and was able to ambulate to sink to perform self care tasks while standing with min guard assist. Patient performed seated rest break following standing at sink and performed mobility in hallway before completing in recliner. Patient is making good progress and is good candidate for continued OT with HHOT.    Recommendations for follow up therapy are one component of a multi-disciplinary discharge planning process, led by the attending physician.  Recommendations may be updated based on patient status, additional functional criteria and insurance authorization.    Follow Up Recommendations  Home health OT    Assistance Recommended at Discharge Intermittent Supervision/Assistance  Patient can return home with the following  A little help with walking and/or transfers;A little help with bathing/dressing/bathroom;Assistance with cooking/housework   Equipment Recommendations  BSC/3in1    Recommendations for Other Services      Precautions / Restrictions Precautions Precautions: Fall Restrictions Weight Bearing Restrictions: No       Mobility Bed Mobility Overal bed mobility: Needs Assistance Bed Mobility: Supine to Sit     Supine to sit: Min guard     General bed mobility comments: use of rails to get to EOB    Transfers Overall transfer level: Needs assistance Equipment used: Rolling walker (2 wheels) Transfers: Sit to/from Stand, Bed to chair/wheelchair/BSC Sit to Stand: Min guard     Step pivot transfers: Min guard     General transfer comment: patient was min guard for transfers with no LOB      Balance Overall balance assessment: Needs assistance Sitting-balance support: No upper extremity supported, Feet supported Sitting balance-Leahy Scale: Fair     Standing balance support: Bilateral upper extremity supported, Reliant on assistive device for balance, Single extremity supported Standing balance-Leahy Scale: Poor Standing balance comment: able to stand at sink for grooming tasks reliant on sink for balance with one extremity                           ADL either performed or assessed with clinical judgement   ADL Overall ADL's : Needs assistance/impaired     Grooming: Wash/dry hands;Wash/dry face;Oral care;Brushing hair;Standing;Min guard Grooming Details (indicate cue type and reason): at sink         Upper Body Dressing : Min guard;Standing Upper Body Dressing Details (indicate cue type and reason): donned gown to cover back                 Functional mobility during ADLs: Min guard;Rolling walker (2 wheels) General ADL Comments: min guard for functional mobility and followed directions well    Extremity/Trunk Assessment              Vision       Perception     Praxis      Cognition Arousal/Alertness: Awake/alert Behavior During Therapy: WFL for tasks assessed/performed Overall Cognitive Status: Within Functional Limits for tasks assessed                                 General Comments: [facility]  Exercises      Shoulder Instructions       General Comments performed mobiltiy in hallway with min guard assist    Pertinent Vitals/ Pain       Pain Assessment Pain Assessment: No/denies pain  Home Living                                          Prior Functioning/Environment              Frequency  Min 2X/week        Progress Toward Goals  OT Goals(current goals can now be found in the care plan section)  Progress towards OT goals: Progressing toward goals  Acute Rehab OT  Goals Patient Stated Goal: go home OT Goal Formulation: With patient Time For Goal Achievement: 05/28/22 Potential to Achieve Goals: Fair ADL Goals Pt Will Perform Grooming: with modified independence;sitting Pt Will Transfer to Toilet: with supervision;ambulating;bedside commode Pt Will Perform Toileting - Clothing Manipulation and hygiene: with modified independence;sitting/lateral leans Pt Will Perform Tub/Shower Transfer: Shower transfer;with supervision;with caregiver independent in assisting;ambulating;3 in 1;rolling walker Additional ADL Goal #1: Pt will implement 1-2 energy conservation techniques during ADL session w/ less than 2 vc's (issue/review handout)  Plan Discharge plan remains appropriate    Co-evaluation                 AM-PAC OT "6 Clicks" Daily Activity     Outcome Measure   Help from another person eating meals?: None Help from another person taking care of personal grooming?: A Little Help from another person toileting, which includes using toliet, bedpan, or urinal?: A Little Help from another person bathing (including washing, rinsing, drying)?: A Little Help from another person to put on and taking off regular upper body clothing?: A Little Help from another person to put on and taking off regular lower body clothing?: A Little 6 Click Score: 19    End of Session Equipment Utilized During Treatment: Gait belt;Rolling walker (2 wheels)  OT Visit Diagnosis: Unsteadiness on feet (R26.81);Muscle weakness (generalized) (M62.81)   Activity Tolerance Patient tolerated treatment well   Patient Left in chair;with call bell/phone within reach;with chair alarm set   Nurse Communication Mobility status        Time: [id number] OT Time Calculation (min): 25 min  Charges: OT General Charges $OT Visit: 1 Visit OT Treatments $Self Care/Home Management : 8-22 mins $Therapeutic Activity: 8-22 mins  Lodema Hong, [place]  Pager  [phone] Office [place] 05/15/2022, 9:59 AM

## 2022-05-15 NOTE — Plan of Care (Signed)
  Problem: Education: Goal: Ability to demonstrate management of disease process will improve Outcome: Completed/Met Goal: Ability to verbalize understanding of medication therapies will improve Outcome: Completed/Met Goal: Individualized Educational Video(s) Outcome: Completed/Met   Problem: Activity: Goal: Capacity to carry out activities will improve Outcome: Completed/Met   Problem: Cardiac: Goal: Ability to achieve and maintain adequate cardiopulmonary perfusion will improve Outcome: Completed/Met   Problem: Education: Goal: Ability to demonstrate management of disease process will improve Outcome: Completed/Met Goal: Ability to verbalize understanding of medication therapies will improve Outcome: Completed/Met Goal: Individualized Educational Video(s) Outcome: Completed/Met   Problem: Activity: Goal: Capacity to carry out activities will improve Outcome: Completed/Met   Problem: Cardiac: Goal: Ability to achieve and maintain adequate cardiopulmonary perfusion will improve Outcome: Completed/Met   Problem: Education: Goal: Knowledge of General Education information will improve Description: Including pain rating scale, medication(s)/side effects and non-pharmacologic comfort measures Outcome: Completed/Met   Problem: Health Behavior/Discharge Planning: Goal: Ability to manage health-related needs will improve Outcome: Completed/Met   Problem: Clinical Measurements: Goal: Ability to maintain clinical measurements within normal limits will improve Outcome: Completed/Met Goal: Will remain free from infection Outcome: Completed/Met Goal: Diagnostic test results will improve Outcome: Completed/Met Goal: Respiratory complications will improve Outcome: Completed/Met Goal: Cardiovascular complication will be avoided Outcome: Completed/Met   Problem: Activity: Goal: Risk for activity intolerance will decrease Outcome: Completed/Met   Problem: Nutrition: Goal:  Adequate nutrition will be maintained Outcome: Completed/Met   Problem: Coping: Goal: Level of anxiety will decrease Outcome: Completed/Met   Problem: Elimination: Goal: Will not experience complications related to bowel motility Outcome: Completed/Met Goal: Will not experience complications related to urinary retention Outcome: Completed/Met   Problem: Pain Managment: Goal: General experience of comfort will improve Outcome: Completed/Met   Problem: Safety: Goal: Ability to remain free from injury will improve Outcome: Completed/Met   Problem: Skin Integrity: Goal: Risk for impaired skin integrity will decrease Outcome: Completed/Met

## 2022-05-15 NOTE — Discharge Instructions (Signed)

## 2022-05-15 NOTE — TOC Transition Note (Signed)
Transition of Care [facility]) - CM/SW Discharge Note   Patient Details  Name: TALMAGE TEASTER MRN: [id number] Date of Birth: 1926/09/03  [facility] CM/SW Contact:  Lockie Pares, RN Phone Number: 05/15/2022, 11:08 AM   Clinical Narrative:     Patient discharging family asking for a tall walker, called [employer], discussed with family. They can meet requirements of height and will bring the walker up ASAP.   Final next level of care: Home w Home Health Services Barriers to Discharge: Continued Medical Work up   Patient Goals and CMS Choice Patient states their goals for this hospitalization and ongoing recovery are:: return home CMS Medicare.gov Compare Post Acute Care list provided to:: Patient Choice offered to / list presented to : Patient  Discharge Placement               Home with home health        Discharge Plan and Services In-house Referral: NA Discharge Planning Services: CM Consult Post Acute Care Choice: Home Health          DME Arranged: Walker rolling DME Agency: NA Date DME Agency Contacted: 05/15/22 Time DME Agency Contacted: 1108 Representative spoke with at DME Agency: Darral Dash HH Arranged: PT HH Agency: [facility] Date [facility] Agency Contacted: 05/13/22 Time HH Agency Contacted: [phone] Representative spoke with at [facility] Agency: Kandee Keen  Social Determinants of Health (SDOH) Interventions Food Insecurity Interventions: Intervention Not Indicated Financial Strain Interventions: Intervention Not Indicated Housing Interventions: Intervention Not Indicated Transportation Interventions: Intervention Not Indicated   Readmission Risk Interventions     View : No data to display.

## 2022-05-15 NOTE — Progress Notes (Signed)
Progress Note  Patient Name: Barry Burgess Date of Encounter: 05/15/2022  [facility] Cardiologist: Jenkins Rouge, MD   Subjective   Very appreciative.  Feels much better.  Says his balance and strength is improved.  Really likes the Ace wraps.  Inpatient Medications    Scheduled Meds:  apixaban  5 mg Oral BID   dorzolamide-timolol  1 drop Both Eyes BID   finasteride  5 mg Oral Daily   furosemide  40 mg Oral BID   latanoprost  1 drop Both Eyes q morning   nystatin cream   Topical BID   pravastatin  40 mg Oral QHS   sodium chloride flush  3 mL Intravenous Q12H   Continuous Infusions:  PRN Meds: acetaminophen **OR** acetaminophen, Gerhardt's butt cream, polyethylene glycol   Vital Signs    Vitals:   05/15/22 0028 05/15/22 0358 05/15/22 0536 05/15/22 0720  BP: (!) 145/95 133/84  120/75  Pulse: 85 88  79  Resp: 14 12  14   Temp: 97.7 F (36.5 C) (!) 97.5 F (36.4 C)  (!) 97.4 F (36.3 C)  TempSrc: Oral Oral  Axillary  SpO2: 96% 98%  97%  Weight:   83.7 kg   Height:        Intake/Output Summary (Last 24 hours) at 05/15/2022 1007 Last data filed at 05/15/2022 0029 Gross per 24 hour  Intake 240 ml  Output 3200 ml  Net -2960 ml      05/15/2022    5:36 AM 05/14/2022    5:00 AM 05/13/2022    1:08 AM  Last 3 Weights  Weight (lbs) 184 lb 9.6 oz 199 lb 11.8 oz 199 lb 11.8 oz  Weight (kg) 83.734 kg 90.6 kg 90.6 kg      Telemetry    Atrial fibrillation heart rate in the 80s and 90s.  No pauses.- Personally Reviewed  ECG    No new- Personally Reviewed  Physical Exam   GEN: No acute distress.   Neck: No JVD Cardiac: Irregularly irregular, no murmurs, rubs, or gallops.  Respiratory: Clear to auscultation bilaterally. GI: Soft, nontender, non-distended  MS: Chronic 2+ to 3+ edema markedly improved, Ace wraps now; No deformity. Neuro:  Nonfocal  Psych: Normal affect   Labs    High Sensitivity Troponin:  No results for input(s): TROPONINIHS in the last  720 hours.   Chemistry Recent Labs  Lab 05/11/22 1617 05/12/22 0316 05/14/22 0422 05/15/22 0345  NA 141 145 137 139  K 4.0 4.2 3.7 3.5  CL 104 108 97* 95*  CO2 25 32 35* 33*  GLUCOSE 178* 126* 102* 99  BUN 18 18 20  24*  CREATININE 1.35* 1.35* 1.58* 1.35*  CALCIUM 8.8* 8.9 8.6* 8.7*  MG 1.8  --  2.0  --   PROT  --  6.1*  --   --   ALBUMIN  --  3.3*  --   --   AST  --  23  --   --   ALT  --  20  --   --   ALKPHOS  --  69  --   --   BILITOT  --  0.9  --   --   GFRNONAA 49* 49* 41* 49*  ANIONGAP 12 5 5 11     Lipids No results for input(s): CHOL, TRIG, HDL, LABVLDL, LDLCALC, CHOLHDL in the last 168 hours.  Hematology Recent Labs  Lab 05/11/22 1617 05/12/22 0316  WBC 4.5 5.8  RBC 4.03* 3.93*  HGB 12.5* 12.4*  HCT 39.9 37.8*  MCV 99.0 96.2  MCH 31.0 31.6  MCHC 31.3 32.8  RDW 15.1 15.0  PLT 164 161   Thyroid No results for input(s): TSH, FREET4 in the last 168 hours.  BNPNo results for input(s): BNP, PROBNP in the last 168 hours.  DDimer No results for input(s): DDIMER in the last 168 hours.   Radiology    No results found.  Cardiac Studies   Echocardiogram EF 50 to 55%  Patient Profile     88 y.o. male with longstanding persistent atrial fibrillaiton here with lower extremity edema, weakness, atrial fibrillation with bradycardia, moderate aortic regurgitation  Assessment & Plan    Persistent atrial fibrillation with prior bradycardia - We stopped his diltiazem 360 mg.  His heart rate increased appropriately.  It was in the 30s to 40s at times during admission.  Now in the 80s to 10s. -Discussed with him and his family that if tachycardia ensues, we may very well need a pacemaker for protection against bradycardia.  Chronic anticoagulation - Continue with Eliquis 5 mg twice a day.  Acute on chronic diastolic heart failure - Has significant/severe lower extremity edema.  This is markedly improved but still has a ways to go.  We can transition him today from  IV Lasix over to p.o. and continue to monitor as outpatient.  He was previously taking 40 mg daily.  I think it would be reasonable to take 40 mg twice daily.  He understands the importance of daily weights at home.  3.2 L yesterday out.  He is out approximately 9 L during this admission.  His weight yesterday was 90.6 kg, today 83.7 kg.  184.6 pounds.  On admission he was 215 pounds or 97.5 kg. -Blood pressure this morning 120/75.  Lets continue to hold his losartan.  CKD stage IIIa - Creatinine back to 1.35.  Yesterday it was 1.6.  Continue to monitor as outpatient.  I am comfortable with discharge home.  We will have close follow-up in clinic.   For questions or updates, please contact [place] Please consult [email or web address] for contact info under        Signed, Candee Furbish, MD  05/15/2022, 10:07 AM

## 2022-05-15 NOTE — Care Management Important Message (Signed)
Important Message  Patient Details  Name: KEYTH KUPEC MRN: [id number] Date of Birth: [birth year]   Medicare Important Message Given:  Yes     Shelda Altes 05/15/2022, 7:50 AM

## 2022-05-25 NOTE — Progress Notes (Incomplete)
HEART & VASCULAR TRANSITION OF CARE CONSULT NOTE     Referring Physician: Dr. Marlou Porch Primary Care: Hulan Fess, MD  Primary Cardiologist: Jenkins Rouge, MD   HPI: Referred to clinic by Dr. Marlou Porch for heart failure consultation.   88 y/o male w/ chronic diastolic dysfunction, HTN, HLD persistent atrial fibrillation, aortic regurgitation, tricuspid regurgitation and stage IIIa CKD.   Recent admit 5/23 for a/c CHF w/ volume overload. Echo LVEF 50-55%, RV severely enlarged w/ normal systolic function, severe BAE, mild AI, mild-mod TR.  Diuresed w/ IV Lasix and transitioned back to PO. Hospitalization notable for bradycardia w/ HR in the 30s, improved w/ discontinuation of Cardizem. Discharged home on 5/26. D/c wt 184 lb. Referred to [facility] clinic.   Presents today for f/u. Here w/ family. Feels much better. Says he diuresed 30 lb while in the hospital. Reports full compliance w/ meds w/ good UOP w/ Lasix. Complaining of having to urinate frequently at night. Wt down 6 lb since d/c at 178 lb. C/w some LEE but no exertional dyspnea, though not very active at baseline. Family has been trying to get him to walk more at home. Eating low sodium diet and fluid restricting. HR ok off Cardizem, in 60s. Has been monitoring VS at home and reports BP and HR have been ok. BP 122/84 today. Denies dizziness. Compliant w/ Eliquis. No falls. Denies abnormal bleeding.   Cardiac Testing   2D Echo 5/23 Left ventricular ejection fraction, by estimation, is 50 to 55%. The left ventricle has low normal function. The left ventricle demonstrates global hypokinesis. Left ventricular diastolic parameters are indeterminate. 1. Right ventricular systolic function is normal. The right ventricular size is severely enlarged. Mildly increased right ventricular wall thickness. 2. 3. Left atrial size was severely dilated. 4. Right atrial size was severely dilated. The mitral valve is grossly normal. No evidence of  mitral valve regurgitation. No evidence of mitral stenosis. 5. 6. Tricuspid valve regurgitation is mild to moderate and atrial functional in nature. The aortic valve is tricuspid. There is moderate calcification of the aortic valve. There is mild thickening of the aortic valve. Aortic valve regurgitation is trivial. Aortic valve sclerosis is present, with no evidence of aortic valve stenosis.     Review of Systems: [y] = yes, [ ]  = no   General: Weight gain [ ] ; Weight loss [ ] ; Anorexia [ ] ; Fatigue [ ] ; Fever [ ] ; Chills [ ] ; Weakness [ ]   Cardiac: Chest pain/pressure [ ] ; Resting SOB [ ] ; Exertional SOB [ ] ; Orthopnea [ ] ; Pedal Edema [ Y]; Palpitations [ ] ; Syncope [ ] ; Presyncope [ ] ; Paroxysmal nocturnal dyspnea[ ]   Pulmonary: Cough [ ] ; Wheezing[ ] ; Hemoptysis[ ] ; Sputum [ ] ; Snoring [ ]   GI: Vomiting[ ] ; Dysphagia[ ] ; Melena[ ] ; Hematochezia [ ] ; Heartburn[ ] ; Abdominal pain [ ] ; Constipation [ ] ; Diarrhea [ ] ; BRBPR [ ]   GU: Hematuria[ ] ; Dysuria [ ] ; Nocturia[ ]   Vascular: Pain in legs with walking [ ] ; Pain in feet with lying flat [ ] ; Non-healing sores [ ] ; Stroke [ ] ; TIA [ ] ; Slurred speech [ ] ;  Neuro: Headaches[ ] ; Vertigo[ ] ; Seizures[ ] ; Paresthesias[ ] ;Blurred vision [ ] ; Diplopia [ ] ; Vision changes [ ]   Ortho/Skin: Arthritis [ ] ; Joint pain [ ] ; Muscle pain [ ] ; Joint swelling [ ] ; Back Pain [ ] ; Rash [ ]   Psych: Depression[ ] ; Anxiety[ ]   Heme: Bleeding problems [ ] ; Clotting disorders [ ] ; Anemia [ ]   Endocrine: Diabetes [ ] ; Thyroid dysfunction[ ]    Past Medical History:  Diagnosis Date   Aortic insufficiency    Atrial fibrillation with RVR ([place]) 10/14/2015   BPH (benign prostatic hyperplasia)    Glaucoma    Hyperlipidemia    Hypertension    Mitral regurgitation    PAF (paroxysmal atrial fibrillation) (HCC)    Tricuspid regurgitation     Current Outpatient Medications  Medication Sig Dispense Refill   apixaban (ELIQUIS) 5 MG TABS tablet TAKE 1 TABLET  BY MOUTH  TWICE DAILY (Patient taking differently: Take 5 mg by mouth 2 (two) times daily.) 180 tablet 1   dorzolamide-timolol (COSOPT) 22.3-6.8 MG/ML ophthalmic solution Place 1 drop into both eyes 2 (two) times daily.  6   doxazosin (CARDURA) 4 MG tablet Take 4 mg by mouth daily.  2   finasteride (PROSCAR) 5 MG tablet Take 5 mg by mouth daily.  2   latanoprost (XALATAN) 0.005 % ophthalmic solution Place 1 drop into both eyes every morning.  8   pravastatin (PRAVACHOL) 80 MG tablet Take 40 mg by mouth daily.      furosemide (LASIX) 40 MG tablet Take 1 tablet (40 mg total) by mouth 2 (two) times daily. 60 tablet 0   No current facility-administered medications for this encounter.    No Known Allergies    Social History   Socioeconomic History   Marital status: Married    Spouse name: Not on file   Number of children: Not on file   Years of education: Not on file   Highest education level: Not on file  Occupational History   Occupation: Retired  Tobacco Use   Smoking status: Former    Types: Pipe, Cigars    Quit date: 10/13/1962    Years since quitting: 59.6   Smokeless tobacco: Never  Vaping Use   Vaping Use: Never used  Substance and Sexual Activity   Alcohol use: Not Currently   Drug use: No   Sexual activity: Not on file  Other Topics Concern   Not on file  Social History Narrative   Not on file   Social Determinants of Health   Financial Resource Strain: Low Risk    Difficulty of Paying Living Expenses: Not hard at all  Food Insecurity: No Food Insecurity   Worried About Charity fundraiser in the Last Year: Never true   [place] in the Last Year: Never true  Transportation Needs: No Transportation Needs   Lack of Transportation (Medical): No   Lack of Transportation (Non-Medical): No  Physical Activity: Not on file  Stress: Not on file  Social Connections: Not on file  Intimate Partner Violence: Not on file      Family History  Problem Relation  Age of Onset   Stroke Father 45    Vitals:   05/26/22 1024  BP: 122/84  Pulse: 84  SpO2: 96%  Weight: 81 kg (178 lb 9.6 oz)    PHYSICAL EXAM: General:  Well appearing elderly male, ambulating w/ walker. Kyphotic posture.  No respiratory difficulty HEENT: normal Neck: supple. no JVD. Carotids 2+ bilat; no bruits. No lymphadenopathy or thryomegaly appreciated. Cor: PMI nondisplaced. Irregularly irregular rhythm and rate. No rubs, gallops or murmurs. Lungs: clear Abdomen: soft, nontender, nondistended. No hepatosplenomegaly. No bruits or masses. Good bowel sounds. Extremities: no cyanosis, clubbing, rash, 1+ b/l LE edema Neuro: alert & oriented x 3, cranial nerves grossly intact. moves all 4 extremities w/o difficulty. Affect pleasant.  ECG: Not performed    ASSESSMENT & PLAN:  Chronic Diastolic Heart Failure - Echo 5/23, EF 50-55%, RV enlarged w/ normal systolic function  - NYHA class II, limited more by deconditioning and age  - wt down 6 lb but c/w LEE - continue Lasix 40 mg bid (advised he adjust PM dose and take at 4PM, to minimize need to urinate at night)  - avoiding SGLT2i/MRA given age and CKD  - encouraged leg elevation and use of TED hoses  - BP controlled. No ? blockers w/ h/o bradycardia - continue low sodium diet and fluid restriction   2. Persistent Atrial Fibrillation  - rate controlled. Off AVN blockers given bradycardia  - on Eliquis 5 mg bid (repeat BMP today, last SCr was 1.4). If SCr > 1.5, will need dose reduction to 2.5 mg bid. Denies abnormal bleeding   3. Bradycardia  - noted recent hospitalization, in the 30s - resolved after discontinuation of Cardizem, HR in 60s - no symptoms    4. Aortic Insuffiencey and Tricuspid Insuffiencey  - mild AI and mild-mod TR on echo 5/23   5. HTN - controlled on current regimen   6. Stage IIIa CKD  - check BMP today   NYHA II  GDMT  Diuretic- Lasix 40 mg bid  BB- No bradycardia  Ace/ARB/ARNI No  MRA No   SGLT2i No     Referred to HFSW (PCP, Medications, Transportation, ETOH Abuse, Drug Abuse, Insurance, Financial ): No Refer to Pharmacy:  No Refer to Home Health: No Refer to Advanced Heart Failure Clinic: No  Refer to General Cardiology: Yes (followed by Dr. [name])  Follow up w/ Dr. [name] in 4 wks

## 2022-05-26 ENCOUNTER — Ambulatory Visit (HOSPITAL_COMMUNITY)
Admit: 2022-05-26 | Discharge: 2022-05-26 | Disposition: A | Discharge status: Home / Self Care | Payer: Medicare Other | Attending: Physician Assistant | Admitting: Physician Assistant

## 2022-05-26 ENCOUNTER — Encounter (HOSPITAL_COMMUNITY): Payer: Self-pay

## 2022-05-26 ENCOUNTER — Telehealth (HOSPITAL_COMMUNITY): Payer: Self-pay | Admitting: *Deleted

## 2022-05-26 VITALS — BP 122/84 | HR 84 | Wt 178.6 lb

## 2022-05-26 DIAGNOSIS — Z79899 Other long term (current) drug therapy: Secondary | ICD-10-CM | POA: Diagnosis not present

## 2022-05-26 DIAGNOSIS — R001 Bradycardia, unspecified: Secondary | ICD-10-CM | POA: Diagnosis not present

## 2022-05-26 DIAGNOSIS — I130 Hypertensive heart and chronic kidney disease with heart failure and stage 1 through stage 4 chronic kidney disease, or unspecified chronic kidney disease: Secondary | ICD-10-CM | POA: Diagnosis not present

## 2022-05-26 DIAGNOSIS — Z7901 Long term (current) use of anticoagulants: Secondary | ICD-10-CM | POA: Diagnosis not present

## 2022-05-26 DIAGNOSIS — N1831 Chronic kidney disease, stage 3a: Secondary | ICD-10-CM | POA: Insufficient documentation

## 2022-05-26 DIAGNOSIS — I5032 Chronic diastolic (congestive) heart failure: Secondary | ICD-10-CM | POA: Insufficient documentation

## 2022-05-26 DIAGNOSIS — I4819 Other persistent atrial fibrillation: Secondary | ICD-10-CM | POA: Insufficient documentation

## 2022-05-26 DIAGNOSIS — E785 Hyperlipidemia, unspecified: Secondary | ICD-10-CM | POA: Diagnosis not present

## 2022-05-26 DIAGNOSIS — I082 Rheumatic disorders of both aortic and tricuspid valves: Secondary | ICD-10-CM | POA: Insufficient documentation

## 2022-05-26 LAB — BASIC METABOLIC PANEL
Anion gap: 10 (ref 5–15)
BUN: 26 mg/dL — ABNORMAL HIGH (ref 8–23)
CO2: 30 mmol/L (ref 22–32)
Calcium: 9.2 mg/dL (ref 8.9–10.3)
Chloride: 101 mmol/L (ref 98–111)
Creatinine, Ser: 1.34 mg/dL — ABNORMAL HIGH (ref 0.61–1.24)
GFR, Estimated: 49 mL/min — ABNORMAL LOW (ref >60)
Glucose, Bld: 130 mg/dL — ABNORMAL HIGH (ref 70–99)
Potassium: 4.1 mmol/L (ref 3.5–5.1)
Sodium: 141 mmol/L (ref 135–145)

## 2022-05-26 MED ORDER — FUROSEMIDE 40 MG PO TABS: 40.0000 mg | ORAL_TABLET | Freq: Two times a day (BID) | ORAL | 0 refills | Status: DC

## 2022-05-26 NOTE — Telephone Encounter (Signed)
Call attempted to confirm HV TOC appt 10 am on 05/26/22. HIPPA appropriate VM left with callback number.    Rhae Hammock, BSN, Scientist, clinical (histocompatibility and immunogenetics) Only

## 2022-05-26 NOTE — Patient Instructions (Addendum)
Medication Changes:  None. Continue current regimen.   Lab Work:  Labs done today, your results will be available in MyChart, we will contact you for abnormal readings.  Special Instructions // Education:  Do the following things EVERYDAY: Weigh yourself in the morning before breakfast. Write it down and keep it in a log. Take your medicines as prescribed Eat low salt foods--Limit salt (sodium) to 2000 mg per day.  Stay as active as you can everyday Limit all fluids for the day to less than 2 liters Wear ted hose/compression stockings  Follow-Up in: in 4 weeks with Dr. Eden Emms. Call his office to make the appointment at your convenience.

## 2022-06-01 NOTE — Progress Notes (Signed)
Date:  06/04/2022   ID:  Barry Burgess, DOB [birth year], MRN [id number]  PCP:  Catha Gosselin, MD  Cardiologist:   Eden Emms Electrophysiologist:  None   Evaluation Performed:  Follow-Up Visit  Chief Complaint:  Afib  History of Present Illness:    88 y.o. y.o. history of HLD, HTN, and PAF. CHA2DVASC 3 on eliquis for anticoagulation. Last [facility] 10/2015 Still watching "[employer] " and has several history degrees. 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 He was massively volume Overloaded. Diuresed 30 lbs and cardizem held for slow afib Una boots applied to LEls  Had gone done hill quite a bit and not out of house much due to COVID Losartan held for low BP Cardizem stopped due to slow afib D/c with lasix 40 mg bid and Cr 1.3 at D/c    Living with Grandson now At this point with age and debility should be transitioning to palliative type care  New address is [address] in [place]   Exercising now Walking with Administrator, arts who is a Adult nurse exercise bike indoors  Past Medical History:  Diagnosis Date   Aortic insufficiency    Atrial fibrillation with RVR (HCC) 10/14/2015   BPH (benign prostatic hyperplasia)    Glaucoma    Hyperlipidemia    Hypertension    Mitral regurgitation    PAF (paroxysmal atrial fibrillation) (HCC)    Tricuspid regurgitation    Past Surgical History:  Procedure Laterality Date   CARDIOVERSION N/A 11/13/2015   Procedure: CARDIOVERSION;  Surgeon: Wendall Stade, MD;  [facility]: [facility] ENDOSCOPY;  Service: Cardiovascular;  Laterality: N/A;   EYE SURGERY     bilateral cataracts   PILONIDAL CYST / SINUS EXCISION       Current Meds  Medication Sig   apixaban (ELIQUIS) 5 MG TABS tablet TAKE 1 TABLET BY MOUTH  TWICE DAILY (Patient taking differently: Take 5 mg by mouth 2 (two) times daily.)   dorzolamide-timolol  (COSOPT) 22.3-6.8 MG/ML ophthalmic solution Place 1 drop into both eyes 2 (two) times daily.   doxazosin (CARDURA) 4 MG tablet Take 4 mg by mouth daily.   finasteride (PROSCAR) 5 MG tablet Take 5 mg by mouth daily.   furosemide (LASIX) 40 MG tablet Take 1 tablet (40 mg total) by mouth 2 (two) times daily.   latanoprost (XALATAN) 0.005 % ophthalmic solution Place 1 drop into both eyes every morning.   pravastatin (PRAVACHOL) 80 MG tablet Take 40 mg by mouth daily.      Allergies:   Patient has no known allergies.   Social History   Tobacco Use   Smoking status: Former    Types: Pipe, Cigars    Quit date: 10/13/1962    Years since quitting: 59.6   Smokeless tobacco: Never  Vaping Use   Vaping Use: Never used  Substance Use Topics   Alcohol use: Not Currently   Drug use: No     Family Hx: The patient's family history includes Stroke (age of onset: 80) in his father.  ROS:   Please see the history of present illness.     All other systems reviewed and are negative.   Prior CV studies:   The following studies were reviewed today:  Echo 09/2018 EF 60-65% moderate AR mild MR severe LAE   Labs/Other Tests and Data Reviewed:  EKG:   afib rate 92 nonspecific ST changes 2018  Recent Labs: 05/12/2022: ALT 20; Hemoglobin 12.4; Platelets 161 05/14/2022: Magnesium 2.0 05/26/2022: BUN 26; Creatinine, Ser 1.34; Potassium 4.1; Sodium 141   Recent Lipid Panel Lab Results  Component Value Date/Time   CHOL 93 10/15/2015 02:14 AM   TRIG 27 10/15/2015 02:14 AM   HDL 53 10/15/2015 02:14 AM   CHOLHDL 1.8 10/15/2015 02:14 AM   LDLCALC 35 10/15/2015 02:14 AM    Wt Readings from Last 3 Encounters:  06/04/22 177 lb (80.3 kg)  05/26/22 178 lb 9.6 oz (81 kg)  05/15/22 184 lb 9.6 oz (83.7 kg)     Objective:    Vital Signs:  BP 128/62   Pulse (!) 109   Ht 6' (1.829 m)   Wt 177 lb (80.3 kg)   SpO2 99%   BMI 24.01 kg/m    No exam virtual visit   ASSESSMENT & PLAN:    1. PAF -  asymptomatic continue anticoagulation with eliquis  GFR has been > 60 and weight > 60 kg so  despite age on eliquis 5 bid    2. HTN: losartan d/c    3. AR:  Trivial by most recent echo 05/12/22     4. Diastolic Dysfunction/Right heart failure :  continue lasix 40 mg bid Daily weights BMET/BNP today     Medication Adjustments/Labs and Tests Ordered: Current medicines are reviewed at length with the patient today.  Concerns regarding medicines are outlined above.   Tests Ordered:  BMET / BNP   Medication Changes:  None   Disposition:  Follow up in 6 weeks  Time:  Spent reviewing chart , echo direct patient interview and composing note 20 minutes   Signed, Jenkins Rouge, MD  06/04/2022 8:20 AM    [place]

## 2022-06-04 ENCOUNTER — Ambulatory Visit: Payer: Medicare Other | Admitting: Cardiovascular Disease

## 2022-06-04 ENCOUNTER — Encounter: Payer: Self-pay | Admitting: Cardiovascular Disease

## 2022-06-04 VITALS — BP 128/62 | HR 109 | Ht 72.0 in | Wt 177.0 lb

## 2022-06-04 DIAGNOSIS — I351 Nonrheumatic aortic (valve) insufficiency: Secondary | ICD-10-CM

## 2022-06-04 DIAGNOSIS — I5032 Chronic diastolic (congestive) heart failure: Secondary | ICD-10-CM | POA: Diagnosis not present

## 2022-06-04 DIAGNOSIS — I480 Paroxysmal atrial fibrillation: Secondary | ICD-10-CM

## 2022-06-04 MED ORDER — FUROSEMIDE 40 MG PO TABS: 40.0000 mg | ORAL_TABLET | Freq: Two times a day (BID) | ORAL | 11 refills | Status: DC

## 2022-06-04 NOTE — Patient Instructions (Signed)

## 2022-08-27 ENCOUNTER — Other Ambulatory Visit: Payer: Self-pay | Admitting: Cardiovascular Disease

## 2022-08-27 DIAGNOSIS — I4891 Unspecified atrial fibrillation: Secondary | ICD-10-CM

## 2022-08-28 NOTE — Telephone Encounter (Signed)
Prescription refill request for Eliquis received. Indication:Afib  Last office visit: 06/04/22 Eden Emms)  Scr: 1.34 (05/26/22)  Age: 88 Weight: 80.3kg  Appropriate dose and refill sent to requested pharmacy.

## 2022-08-31 ENCOUNTER — Telehealth: Payer: Self-pay | Admitting: Cardiovascular Disease

## 2022-08-31 NOTE — Telephone Encounter (Signed)
Pt c/o medication issue:  1. Name of Medication:   furosemide (LASIX) 40 MG tablet     2. How are you currently taking this medication (dosage and times per day)? Take 1 tablet (40 mg total) by mouth 2 (two) times daily. - Oral  3. Are you having a reaction (difficulty breathing--STAT)? no  4. What is your medication issue? Pt son called asking if it is okay for pt to have 5-8 ounces of wine a day while taking this medication

## 2022-08-31 NOTE — Telephone Encounter (Signed)
Patient's son (DPR) calling about patient not having enough water intake and being on furosemide. The only thing patient is drinking is a cup of coffee (AM), 6 to 8 oz of water (through out the day), and a glass of wine (PM). Patient has recently wanted to start drinking an extra glass of wine for lunch. Son is concern that the patient is already not drinking enough water, now he is trying to add an additional glass a day. Encouraged Son to have patient drink more water through out the day, and keep the one glass minimum of wine. Will forward to Dr. Eden Emms for further advisement.

## 2022-10-24 ENCOUNTER — Encounter (HOSPITAL_BASED_OUTPATIENT_CLINIC_OR_DEPARTMENT_OTHER): Payer: Self-pay | Admitting: Emergency Medicine

## 2022-10-24 ENCOUNTER — Emergency Department (HOSPITAL_BASED_OUTPATIENT_CLINIC_OR_DEPARTMENT_OTHER)
Admission: EM | Admit: 2022-10-24 | Discharge: 2022-10-24 | Disposition: A | Discharge status: Home / Self Care | Payer: Medicare Other | Attending: Emergency Medicine | Admitting: Emergency Medicine

## 2022-10-24 ENCOUNTER — Emergency Department (HOSPITAL_BASED_OUTPATIENT_CLINIC_OR_DEPARTMENT_OTHER): Payer: Medicare Other

## 2022-10-24 DIAGNOSIS — Y9355 Activity, bike riding: Secondary | ICD-10-CM | POA: Diagnosis not present

## 2022-10-24 DIAGNOSIS — R609 Edema, unspecified: Secondary | ICD-10-CM

## 2022-10-24 DIAGNOSIS — I10 Essential (primary) hypertension: Secondary | ICD-10-CM | POA: Diagnosis not present

## 2022-10-24 DIAGNOSIS — Z79899 Other long term (current) drug therapy: Secondary | ICD-10-CM | POA: Diagnosis not present

## 2022-10-24 DIAGNOSIS — M7989 Other specified soft tissue disorders: Secondary | ICD-10-CM | POA: Insufficient documentation

## 2022-10-24 DIAGNOSIS — I4891 Unspecified atrial fibrillation: Secondary | ICD-10-CM | POA: Diagnosis not present

## 2022-10-24 DIAGNOSIS — Z7901 Long term (current) use of anticoagulants: Secondary | ICD-10-CM | POA: Insufficient documentation

## 2022-10-24 DIAGNOSIS — S8011XA Contusion of right lower leg, initial encounter: Secondary | ICD-10-CM | POA: Insufficient documentation

## 2022-10-24 DIAGNOSIS — Y92009 Unspecified place in unspecified non-institutional (private) residence as the place of occurrence of the external cause: Secondary | ICD-10-CM | POA: Insufficient documentation

## 2022-10-24 DIAGNOSIS — S8991XA Unspecified injury of right lower leg, initial encounter: Secondary | ICD-10-CM | POA: Diagnosis present

## 2022-10-24 DIAGNOSIS — R600 Localized edema: Secondary | ICD-10-CM | POA: Diagnosis not present
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
## 2022-10-24 NOTE — ED Provider Notes (Signed)
[facility] EMERGENCY DEPARTMENT Provider Note   CSN: [id number] Arrival date & time: 10/24/22  1022     History  Chief Complaint  Patient presents with   Leg Injury    Barry Burgess is a 88 y.o. male.  HPI Patient presents with pain and swelling in his right lower leg.  Around 2 weeks ago was riding an inclined bike at home and his foot slipped and hit the back of his leg on the pedal.  Has had worsening pain since that time.  Worsening swelling.  Some mild redness in the legs.  States now it hurts to stand on the leg.  Patient's family ember states that he had said that the pain was severe sitting down but patient now states it only hurts standing up.  No fevers.  No difficulty breathing.  Does have some chronic edema in his legs.   Past Medical History:  Diagnosis Date   Aortic insufficiency    Atrial fibrillation with RVR ([place]) 10/14/2015   BPH (benign prostatic hyperplasia)    Glaucoma    Hyperlipidemia    Hypertension    Mitral regurgitation    PAF (paroxysmal atrial fibrillation) (HCC)    Tricuspid regurgitation     Home Medications Prior to Admission medications   Medication Sig Start Date End Date Taking? Authorizing Provider  apixaban (ELIQUIS) 5 MG TABS tablet TAKE 1 TABLET BY MOUTH TWICE  DAILY 08/28/22  Yes Josue Hector, MD  dorzolamide-timolol (COSOPT) 22.3-6.8 MG/ML ophthalmic solution Place 1 drop into both eyes 2 (two) times daily. 10/07/15  Yes [provider]  doxazosin (CARDURA) 4 MG tablet Take 4 mg by mouth daily. 10/11/15  Yes [provider]  finasteride (PROSCAR) 5 MG tablet Take 5 mg by mouth daily. 08/14/15  Yes [provider]  furosemide (LASIX) 40 MG tablet Take 1 tablet (40 mg total) by mouth 2 (two) times daily. 06/04/22  Yes Josue Hector, MD  latanoprost (XALATAN) 0.005 % ophthalmic solution Place 1 drop into both eyes every morning. 10/07/15  Yes [provider]  pravastatin (PRAVACHOL) 80  MG tablet Take 40 mg by mouth daily.  11/25/15  Yes [provider]      Allergies    Patient has no known allergies.    Review of Systems   Review of Systems  Physical Exam Updated Vital Signs BP 130/78   Pulse (!) 106   Temp 98.2 F (36.8 C) (Oral)   Resp 16   SpO2 96%  Physical Exam Vitals and nursing note reviewed.  Cardiovascular:     Rate and Rhythm: Regular rhythm.  Musculoskeletal:     Cervical back: Neck supple.     Comments: Some bruising on right lower leg posteriorly.  No tenderness over Achilles.  Able to flex and extend at the ankle.  Does have moderate edema in both lower extremities but worse on the right.  Does have a Band-Aid over a different wound higher on the anterior aspect of the leg.  No bony tenderness.  Neurological:     Mental Status: He is alert.     ED Results / Procedures / Treatments   Labs (all labs ordered are listed, but only abnormal results are displayed) Labs Reviewed - No data to display  EKG None  Radiology US Venous Img Lower Unilateral Right  Result Date: 10/24/2022 CLINICAL DATA:  Right lower extremity pain and edema. Evaluate for DVT. EXAM: RIGHT LOWER EXTREMITY VENOUS DOPPLER ULTRASOUND TECHNIQUE: Gray-scale  sonography with graded compression, as well as color Doppler and duplex ultrasound were performed to evaluate the lower extremity deep venous systems from the level of the common femoral vein and including the common femoral, femoral, profunda femoral, popliteal and calf veins including the posterior tibial, peroneal and gastrocnemius veins when visible. The superficial great saphenous vein was also interrogated. Spectral Doppler was utilized to evaluate flow at rest and with distal augmentation maneuvers in the common femoral, femoral and popliteal veins. COMPARISON:  None Available. FINDINGS: Contralateral Common Femoral Vein: Respiratory phasicity is normal and symmetric with the symptomatic side. No evidence of  thrombus. Normal compressibility. Common Femoral Vein: No evidence of thrombus. Normal compressibility, respiratory phasicity and response to augmentation. Saphenofemoral Junction: No evidence of thrombus. Normal compressibility and flow on color Doppler imaging. Profunda Femoral Vein: No evidence of thrombus. Normal compressibility and flow on color Doppler imaging. Femoral Vein: No evidence of thrombus. Normal compressibility, respiratory phasicity and response to augmentation. Popliteal Vein: No evidence of thrombus. Normal compressibility, respiratory phasicity and response to augmentation. Calf Veins: No evidence of thrombus. Normal compressibility and flow on color Doppler imaging. Superficial Great Saphenous Vein: No evidence of thrombus. Normal compressibility. Other Findings: There is a large amount of subcutaneous edema at the level of the right lower leg and calf. IMPRESSION: No evidence of DVT within the right lower extremity. Electronically Signed   By: Sandi Mariscal M.D.   On: 10/24/2022 12:18    Procedures Procedures    Medications Ordered in ED Medications - No data to display  ED Course/ Medical Decision Making/ A&P                           Medical Decision Making  Patient with leg pain and swelling.  Began after an injury.  Is on anticoagulation due to A-fib.  However with worsening swelling will get DVT evaluation.  Do not think there is a fracture and do not think need x-ray at this time.  Cellulitis also felt less likely.  Doppler negative.  Doubt fracture.  Will wrap to help with the edema.  Follow-up as an outpatient.  Discharge home.        Final Clinical Impression(s) / ED Diagnoses Final diagnoses:  Contusion of right lower leg, initial encounter  Peripheral edema    Rx / DC Orders ED Discharge Orders     None         Davonna Belling, MD 10/24/22 1525

## 2022-11-24 IMAGING — DX DG CHEST 1V PORT
1 series · 1 of 1 positions shown · non-contrast
Comparison: 10/14/2015

CLINICAL DATA: Shortness of breath and leg swelling.

EXAM:
PORTABLE CHEST 1 VIEW

[chest ap]
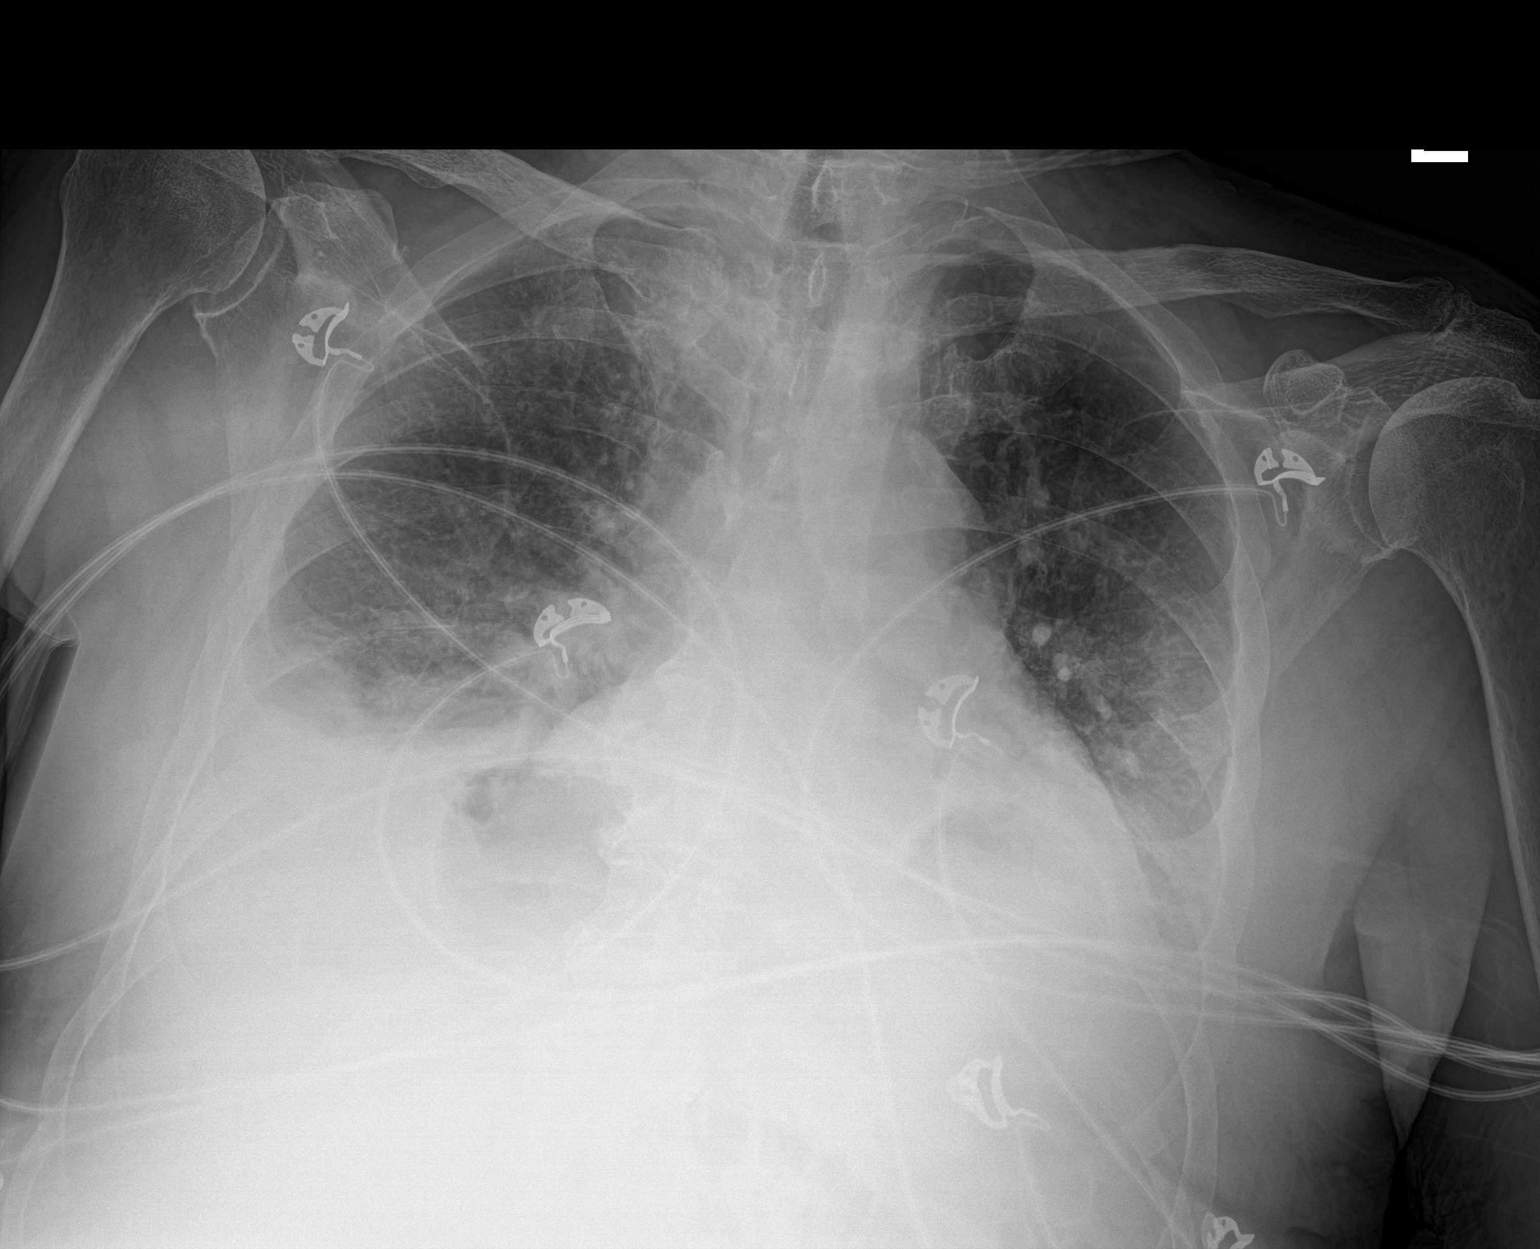

[1 of 1 positions shown; findings below may reference images not displayed]

FINDINGS: Cardiomegaly and pulmonary vascular congestion noted. Mild
interstitial opacities are present.

Bilateral LOWER lung opacities are noted and may represent
atelectasis/consolidation/effusions are present.

No pneumothorax or acute bony abnormality.
IMPRESSION: 1. Cardiomegaly with pulmonary vascular congestion and probable mild
interstitial edema.
2. Bilateral LOWER lung atelectasis/consolidation/effusions.

## 2022-11-24 NOTE — Progress Notes (Signed)
Date:  12/04/2022   ID:  Barry Burgess, DOB Aug 30, 1926, MRN [id number]  PCP:  Hulan Fess, MD  Cardiologist:   [name] Electrophysiologist:  None   Evaluation Performed:  Follow-Up Visit  Chief Complaint:  Afib  History of Present Illness:    88 y.o. y.o. history of HLD, HTN, and PAF. CHA2DVASC 3 on eliquis for anticoagulation. Last [facility] 10/2015 Still watching "[employer] " and has several history degrees. 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 He was massively volume Overloaded. Diuresed 30 lbs and cardizem held for slow afib Una boots applied to Barry Burgess  Had gone done hill quite a bit and not out of house much due to [place] held for low BP Cardizem stopped due to slow afib D/c with lasix 40 mg bid and Cr 1.3 at D/c    Living with Grandson now At this point with age and debility should be transitioning to palliative type care  New address is [address] in [facility]   Exercising now Walking with dog Scientist, clinical (histocompatibility and immunogenetics) who is a Designer, jewellery exercise bike indoors Seen in ED 10/24/22 with right lower leg pain after foot slipped off pedal Venous duplex negative DVT   Long talk about his upbringing in [place]   Past Medical History:  Diagnosis Date   Aortic insufficiency    Atrial fibrillation with RVR ([place]) 10/14/2015   BPH (benign prostatic hyperplasia)    Glaucoma    Hyperlipidemia    Hypertension    Mitral regurgitation    PAF (paroxysmal atrial fibrillation) (HCC)    Tricuspid regurgitation    Past Surgical History:  Procedure Laterality Date   CARDIOVERSION N/A 11/13/2015   Procedure: CARDIOVERSION;  Surgeon: Josue Hector, MD;  [facility] ENDOSCOPY;  Service: Cardiovascular;  Laterality: N/A;   EYE SURGERY     bilateral cataracts   PILONIDAL CYST / SINUS EXCISION       Current Meds  Medication Sig   apixaban (ELIQUIS)  5 MG TABS tablet TAKE 1 TABLET BY MOUTH TWICE  DAILY   dorzolamide-timolol (COSOPT) 22.3-6.8 MG/ML ophthalmic solution Place 1 drop into both eyes 2 (two) times daily.   doxazosin (CARDURA) 4 MG tablet Take 4 mg by mouth daily.   finasteride (PROSCAR) 5 MG tablet Take 5 mg by mouth daily.   furosemide (LASIX) 40 MG tablet Take 1 tablet (40 mg total) by mouth 2 (two) times daily.   latanoprost (XALATAN) 0.005 % ophthalmic solution Place 1 drop into both eyes every morning.   pravastatin (PRAVACHOL) 80 MG tablet Take 40 mg by mouth daily.      Allergies:   Patient has no known allergies.   Social History   Tobacco Use   Smoking status: Former    Types: Pipe, Cigars    Quit date: 10/13/1962    Years since quitting: 60.1   Smokeless tobacco: Never  Vaping Use   Vaping Use: Never used  Substance Use Topics   Alcohol use: Not Currently   Drug use: No     Family Hx: The patient's family history includes Stroke (age of onset: 83) in his father.  ROS:   Please see the history of present illness.     All other systems reviewed and are negative.   Prior CV studies:   The following studies were reviewed today:  Echo 05/12/22 :  IMPRESSIONS     1. Left ventricular ejection fraction, by estimation, is 50 to 55%. The  left ventricle has low normal function. The left ventricle demonstrates  global hypokinesis. Left ventricular diastolic parameters are  indeterminate.   2. Right ventricular systolic function is normal. The right ventricular  size is severely enlarged. Mildly increased right ventricular wall  thickness.   3. Left atrial size was severely dilated.   4. Right atrial size was severely dilated.   5. The mitral valve is grossly normal. No evidence of mitral valve  regurgitation. No evidence of mitral stenosis.   6. Tricuspid valve regurgitation is mild to moderate and atrial  functional in nature.   7. The aortic valve is tricuspid. There is moderate calcification of  the  aortic valve. There is mild thickening of the aortic valve. Aortic valve  regurgitation is trivial. Aortic valve sclerosis is present, with no  evidence of aortic valve stenosis.   Comparison(s): Unable to access 2018 study, further atrial dilation from  prior report.     Labs/Other Tests and Data Reviewed:    EKG:   afib rate 92 nonspecific ST changes 2018  Recent Labs: 05/12/2022: ALT 20; Hemoglobin 12.4; Platelets 161 05/14/2022: Magnesium 2.0 05/26/2022: BUN 26; Creatinine, Ser 1.34; Potassium 4.1; Sodium 141   Recent Lipid Panel Lab Results  Component Value Date/Time   CHOL 93 10/15/2015 02:14 AM   TRIG 27 10/15/2015 02:14 AM   HDL 53 10/15/2015 02:14 AM   CHOLHDL 1.8 10/15/2015 02:14 AM   LDLCALC 35 10/15/2015 02:14 AM    Wt Readings from Last 3 Encounters:  12/04/22 170 lb 6.4 oz (77.3 kg)  06/04/22 177 lb (80.3 kg)  05/26/22 178 lb 9.6 oz (81 kg)     Objective:    Vital Signs:  BP 122/70   Pulse 87   Ht 6' (1.829 m)   Wt 170 lb 6.4 oz (77.3 kg)   SpO2 96%   BMI 23.11 kg/m    Affect appropriate Frail elderly male  HEENT: normal Neck supple with no adenopathy JVP normal no bruits no thyromegaly Lungs clear with no wheezing and good diaphragmatic motion Heart:  S1/S2 no murmur, no rub, gallop or click PMI normal Abdomen: benighn, BS positve, no tenderness, no AAA no bruit.  No HSM or HJR Distal pulses intact with no bruits Plus one bilateral  edema Neuro non-focal Skin warm and dry No muscular weakness   ASSESSMENT & PLAN:    1. PAF - asymptomatic continue anticoagulation with eliquis  GFR is 51 and age will reduce dose to 2.5 mg bid    2. HTN: losartan d/c    3. AR:  Trivial by most recent echo 05/12/22     4. Diastolic Dysfunction/Right heart failure :  continue lasix 40 mg bid Daily weights BMET/BNP today  EF 50-55%    Medication Adjustments/Labs and Tests Ordered: Current medicines are reviewed at length with the patient today.   Concerns regarding medicines are outlined above.   Tests Ordered:  None  Medication Changes:  None   Disposition:  Follow up in 6 months    Signed, Charlton Haws, MD  12/04/2022 8:16 AM    [facility]

## 2022-12-04 ENCOUNTER — Encounter: Payer: Self-pay | Admitting: Cardiovascular Disease

## 2022-12-04 ENCOUNTER — Ambulatory Visit: Payer: Medicare Other | Attending: Cardiovascular Disease | Admitting: Cardiovascular Disease

## 2022-12-04 VITALS — BP 122/70 | HR 87 | Ht 72.0 in | Wt 170.4 lb

## 2022-12-04 DIAGNOSIS — I5032 Chronic diastolic (congestive) heart failure: Secondary | ICD-10-CM | POA: Diagnosis not present

## 2022-12-04 DIAGNOSIS — I480 Paroxysmal atrial fibrillation: Secondary | ICD-10-CM

## 2022-12-04 NOTE — Patient Instructions (Signed)
Medication Instructions:  Your physician recommends that you continue on your current medications as directed. Please refer to the Current Medication list given to you today.  *If you need a refill on your cardiac medications before your next appointment, please call your pharmacy*  Lab Work: If you have labs (blood work) drawn today and your tests are completely normal, you will receive your results only by: MyChart Message (if you have MyChart) OR A paper copy in the mail If you have any lab test that is abnormal or we need to change your treatment, we will call you to review the results.  Testing/Procedures: None ordered today.  Follow-Up: At [facility], you and your health needs are our priority.  As part of our continuing mission to provide you with exceptional heart care, we have created designated Provider Care Teams.  These Care Teams include your primary Cardiologist (physician) and Advanced Practice Providers (APPs -  Physician Assistants and Nurse Practitioners) who all work together to provide you with the care you need, when you need it.  We recommend signing up for the patient portal called "MyChart".  Sign up information is provided on this After Visit Summary.  MyChart is used to connect with patients for Virtual Visits (Telemedicine).  Patients are able to view lab/test results, encounter notes, upcoming appointments, etc.  Non-urgent messages can be sent to your provider as well.   To learn more about what you can do with MyChart, go to [email or web address].    Your next appointment:    6 month(s)  The format for your next appointment:   In Person  Provider:   Peter Nishan, MD     Important Information About Sugar       

## 2023-02-11 ENCOUNTER — Other Ambulatory Visit: Payer: Self-pay | Admitting: Cardiovascular Disease

## 2023-02-11 DIAGNOSIS — I4891 Unspecified atrial fibrillation: Secondary | ICD-10-CM

## 2023-02-12 NOTE — Telephone Encounter (Signed)
Prescription refill request for Eliquis received. Indication: a fib Last office visit: 12/04/22 Scr: 1.34 (05/26/22) Age: 89 Weight: 77kg

## 2023-02-26 LAB — LAB REPORT - SCANNED
A1c: 6.8
Creatinine, POC: 94.6 mg/dL
EGFR: 47
Microalb Creat Ratio: 9

## 2023-03-02 ENCOUNTER — Telehealth: Payer: Self-pay | Admitting: Cardiovascular Disease

## 2023-03-02 DIAGNOSIS — I480 Paroxysmal atrial fibrillation: Secondary | ICD-10-CM

## 2023-03-02 NOTE — Telephone Encounter (Signed)
Pt c/o medication issue:  1. Name of Medication: apixaban (ELIQUIS) 5 MG TABS tablet   2. How are you currently taking this medication (dosage and times per day)?   TAKE 1 TABLET BY MOUTH TWICE DAILY    3. Are you having a reaction (difficulty breathing--STAT)? No  4. What is your medication issue? Pt's son stated he was called yesterday from [place] and they stated they had a note from Dr. [name] stating pt was on Eliquis 2.'5mg'$  twice a day. Pt's son would like a callback regarding this matter since he sees no note of this change. Please advise.

## 2023-03-03 ENCOUNTER — Telehealth: Payer: Self-pay | Admitting: Cardiovascular Disease

## 2023-03-03 NOTE — Telephone Encounter (Signed)
He only meets one of 3 criteria to lower dose that being age He needs repeat BMET and CBC last Cr was 1.34 ( not ,1>1.5 ) and weight 73 kg ( not < 60 kg )   Patient's current weight is 168 lbs per son, 76 kg. Patient will come in on 03/10/23 for BMET and CBC.   Patient's son is aware of Dr. Kyla Balzarine recommendations and agreeable to plan.

## 2023-03-03 NOTE — Telephone Encounter (Signed)
Patient's son called stating he just spoke to Dr. Kyla Balzarine nurse. He states his father just had blood done on Friday, 02/26/23 at [facility] by Dr. Nancy Fetter.  He wants to know if that blood can be used, instead of him having to bring his father in just for lab work as his father doesn't do well in the car. Please advise.

## 2023-03-03 NOTE — Telephone Encounter (Signed)
Patient's son is calling about patient's eliquis. With patient's age and elevated creatinine will see if okay to reduce dose to 2.5 mg.

## 2023-03-08 ENCOUNTER — Telehealth: Payer: Self-pay | Admitting: Cardiovascular Disease

## 2023-03-08 NOTE — Telephone Encounter (Signed)
Called son back see phone note from 03/03/23

## 2023-03-08 NOTE — Telephone Encounter (Signed)
Called patient's son (DPR) to let him know Dr. Lynnda Child office is faxing over lab results. Will see if lab work is what we need and go from there. Patient is not feeling great this week, and will move lab appointment to the next week, just in case we do not have the lab work needed. Patient's son verbalized understanding.

## 2023-03-08 NOTE — Telephone Encounter (Signed)
Son is calling bout patient's labs that is due. Please advise

## 2023-03-10 ENCOUNTER — Ambulatory Visit: Payer: Medicare Other

## 2023-03-16 ENCOUNTER — Telehealth: Payer: Self-pay | Admitting: Cardiovascular Disease

## 2023-03-16 NOTE — Telephone Encounter (Signed)
Received lab work from primary. Will cancel lab appointment. Dr. [name] reviewed BMET. There was no CBC provided. Per Dr. [name] is patient is feeling fine, then we can get CBC when he sees him later this Spring. Patient's son verbalized understanding.

## 2023-03-16 NOTE — Telephone Encounter (Signed)
Pt's son would like a callback regarding pt's lab appointment and office visit appt. Please advise.

## 2023-03-19 ENCOUNTER — Ambulatory Visit: Payer: Medicare Other

## 2023-04-25 ENCOUNTER — Other Ambulatory Visit: Payer: Self-pay | Admitting: Cardiovascular Disease

## 2023-04-25 DIAGNOSIS — I4891 Unspecified atrial fibrillation: Secondary | ICD-10-CM

## 2023-04-26 NOTE — Telephone Encounter (Signed)
Prescription refill request for Eliquis received. Indication: Afib  Last office visit: 12/04/22 Eden Emms)  Scr: 1.38 (02/26/23)  Age: 89 Weight: 77.3kg  Appropriate dose. Refill sent.

## 2023-05-18 NOTE — Progress Notes (Deleted)
Date:  05/18/2023   ID:  Barry Burgess, DOB [birth year], MRN [id number]  PCP:  Catha Gosselin, MD  Cardiologist:   Eden Emms Electrophysiologist:  None   Evaluation Performed:  Follow-Up Visit  Chief Complaint:  Afib  History of Present Illness:    89 y.o. y.o. history of HLD, HTN, and PAF. CHA2DVASC 3 on eliquis for anticoagulation. Last [facility] 10/2015 Still watching "[employer] " and has several history degrees. 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 He was massively volume Overloaded. Diuresed 30 lbs and cardizem held for slow afib Una boots applied to LEls  Had gone done hill quite a bit and not out of house much due to COVID Losartan held for low BP Cardizem stopped due to slow afib D/c with lasix 40 mg bid and Cr 1.3 at D/c    Living with Grandson now At this point with age and debility should be transitioning to palliative type care  New address is [address] in [place] [address]   Exercising now Walking with dog Psychologist, educational who is a Adult nurse exercise bike indoors Seen in ED 10/24/22 with right lower leg pain after foot slipped off pedal Venous duplex negative DVT   Long talk about his upbringing in [place]   ***  Past Medical History:  Diagnosis Date   Aortic insufficiency    Atrial fibrillation with RVR (HCC) 10/14/2015   BPH (benign prostatic hyperplasia)    Glaucoma    Hyperlipidemia    Hypertension    Mitral regurgitation    PAF (paroxysmal atrial fibrillation) (HCC)    Tricuspid regurgitation    Past Surgical History:  Procedure Laterality Date   CARDIOVERSION N/A 11/13/2015   Procedure: CARDIOVERSION;  Surgeon: Wendall Stade, MD;  Location: [facility] ENDOSCOPY;  Service: Cardiovascular;  Laterality: N/A;   EYE SURGERY     bilateral cataracts   PILONIDAL CYST / SINUS EXCISION       No outpatient medications have been marked as  taking for the 05/31/23 encounter (Appointment) with Wendall Stade, MD.     Allergies:   Patient has no known allergies.   Social History   Tobacco Use   Smoking status: Former    Types: Pipe, Cigars    Quit date: 10/13/1962    Years since quitting: 60.6   Smokeless tobacco: Never  Vaping Use   Vaping Use: Never used  Substance Use Topics   Alcohol use: Not Currently   Drug use: No     Family Hx: The patient's family history includes Stroke (age of onset: 20) in his father.  ROS:   Please see the history of present illness.     All other systems reviewed and are negative.   Prior CV studies:   The following studies were reviewed today:  Echo 05/12/22 : IMPRESSIONS     1. Left ventricular ejection fraction, by estimation, is 50 to 55%. The  left ventricle has low normal function. The left ventricle demonstrates  global hypokinesis. Left ventricular diastolic parameters are  indeterminate.   2. Right ventricular systolic function is normal. The right ventricular  size is severely enlarged. Mildly increased right ventricular wall  thickness.   3. Left atrial size was severely dilated.   4. Right atrial size was severely dilated.   5. The mitral valve is grossly normal. No evidence of mitral  valve  regurgitation. No evidence of mitral stenosis.   6. Tricuspid valve regurgitation is mild to moderate and atrial  functional in nature.   7. The aortic valve is tricuspid. There is moderate calcification of the  aortic valve. There is mild thickening of the aortic valve. Aortic valve  regurgitation is trivial. Aortic valve sclerosis is present, with no  evidence of aortic valve stenosis.   Comparison(s): Unable to access 2018 study, further atrial dilation from  prior report.     Labs/Other Tests and Data Reviewed:    EKG:   afib rate 92 nonspecific ST changes 2018  Recent Labs: 05/26/2022: BUN 26; Creatinine, Ser 1.34; Potassium 4.1; Sodium 141   Recent Lipid  Panel Lab Results  Component Value Date/Time   CHOL 93 10/15/2015 02:14 AM   TRIG 27 10/15/2015 02:14 AM   HDL 53 10/15/2015 02:14 AM   CHOLHDL 1.8 10/15/2015 02:14 AM   LDLCALC 35 10/15/2015 02:14 AM    Wt Readings from Last 3 Encounters:  12/04/22 170 lb 6.4 oz (77.3 kg)  06/04/22 177 lb (80.3 kg)  05/26/22 178 lb 9.6 oz (81 kg)     Objective:    Vital Signs:  There were no vitals taken for this visit.   Affect appropriate Frail elderly male  HEENT: normal Neck supple with no adenopathy JVP normal no bruits no thyromegaly Lungs clear with no wheezing and good diaphragmatic motion Heart:  S1/S2 no murmur, no rub, gallop or click PMI normal Abdomen: benighn, BS positve, no tenderness, no AAA no bruit.  No HSM or HJR Distal pulses intact with no bruits Plus one bilateral  edema Neuro non-focal Skin warm and dry No muscular weakness   ASSESSMENT & PLAN:    1. PAF - asymptomatic continue anticoagulation with eliquis  GFR is 69 and age will reduce dose to 2.5 mg bid    2. HTN: losartan d/c    3. AR:  Trivial by most recent echo 05/12/22     4. Diastolic Dysfunction/Right heart failure :  continue lasix 40 mg bid Daily weights BMET/BNP today  EF 50-55%    Medication Adjustments/Labs and Tests Ordered: Current medicines are reviewed at length with the patient today.  Concerns regarding medicines are outlined above.   Tests Ordered:  None  Medication Changes:  None   Disposition:  Follow up in 6 months    Signed, Charlton Haws, MD  05/18/2023 2:14 PM    [facility]

## 2023-05-31 ENCOUNTER — Ambulatory Visit: Payer: Medicare Other | Admitting: Cardiovascular Disease

## 2023-06-04 NOTE — Progress Notes (Signed)
Date:  06/17/2023   ID:  Barry Burgess, DOB 02-Aug-1926, MRN [id number]  PCP:  Catha Gosselin, MD  Cardiologist:   Eden Emms Electrophysiologist:  None   Evaluation Performed:  Follow-Up Visit  Chief Complaint:  Afib  History of Present Illness:    89 y.o. y.o. history of HLD, HTN, and PAF. CHA2DVASC 3 on eliquis for anticoagulation. Last [facility] 10/2015 Still watching "[employer] " and has several history degrees. 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 He was massively volume Overloaded. Diuresed 30 lbs and cardizem held for slow afib Una boots applied to LEls  Had gone done hill quite a bit and not out of house much due to COVID Losartan held for low BP Cardizem stopped due to slow afib D/c with lasix 40 mg bid and Cr 1.3 at D/c    Living with Grandson now At this point with age and debility should be transitioning to palliative type care  New address is [address] in [place] [facility]   Exercising now Walking with dog Psychologist, educational who is a Adult nurse exercise bike indoors Seen in ED 10/24/22 with right lower leg pain after foot slipped off pedal Venous duplex negative DVT   Long talk about his upbringing in [place]   He has had weakness. Has bad tooth that needs to be pulled Felt bad since being on amoxicillin Not drinking enough water And dehydration may be playing role   Past Medical History:  Diagnosis Date   Aortic insufficiency    Atrial fibrillation with RVR (HCC) 10/14/2015   BPH (benign prostatic hyperplasia)    Glaucoma    Hyperlipidemia    Hypertension    Mitral regurgitation    PAF (paroxysmal atrial fibrillation) (HCC)    Tricuspid regurgitation    Past Surgical History:  Procedure Laterality Date   CARDIOVERSION N/A 11/13/2015   Procedure: CARDIOVERSION;  Surgeon: Wendall Stade, MD;  Location: [facility] ENDOSCOPY;  Service:  Cardiovascular;  Laterality: N/A;   EYE SURGERY     bilateral cataracts   PILONIDAL CYST / SINUS EXCISION       Current Meds  Medication Sig   amoxicillin (AMOXIL) 500 MG tablet Take 500 mg by mouth 3 (three) times daily.   dorzolamide-timolol (COSOPT) 22.3-6.8 MG/ML ophthalmic solution Place 1 drop into both eyes 2 (two) times daily.   doxazosin (CARDURA) 4 MG tablet Take 4 mg by mouth daily.   ELIQUIS 5 MG TABS tablet TAKE 1 TABLET BY MOUTH TWICE  DAILY   finasteride (PROSCAR) 5 MG tablet Take 5 mg by mouth daily.   furosemide (LASIX) 40 MG tablet Take 1 tablet (40 mg total) by mouth 2 (two) times daily.   latanoprost (XALATAN) 0.005 % ophthalmic solution Place 1 drop into both eyes every morning.   pravastatin (PRAVACHOL) 80 MG tablet Take 40 mg by mouth daily.      Allergies:   Patient has no known allergies.   Social History   Tobacco Use   Smoking status: Former    Types: Pipe, Cigars    Quit date: 10/13/1962    Years since quitting: 60.7   Smokeless tobacco: Never  Vaping Use   Vaping Use: Never used  Substance Use Topics   Alcohol use: Not Currently   Drug use: No     Family Hx: The patient's family history includes Stroke (  age of onset: 54) in his father.  ROS:   Please see the history of present illness.     All other systems reviewed and are negative.   Prior CV studies:   The following studies were reviewed today:  Echo 05/12/22 : IMPRESSIONS     1. Left ventricular ejection fraction, by estimation, is 50 to 55%. The  left ventricle has low normal function. The left ventricle demonstrates  global hypokinesis. Left ventricular diastolic parameters are  indeterminate.   2. Right ventricular systolic function is normal. The right ventricular  size is severely enlarged. Mildly increased right ventricular wall  thickness.   3. Left atrial size was severely dilated.   4. Right atrial size was severely dilated.   5. The mitral valve is grossly normal.  No evidence of mitral valve  regurgitation. No evidence of mitral stenosis.   6. Tricuspid valve regurgitation is mild to moderate and atrial  functional in nature.   7. The aortic valve is tricuspid. There is moderate calcification of the  aortic valve. There is mild thickening of the aortic valve. Aortic valve  regurgitation is trivial. Aortic valve sclerosis is present, with no  evidence of aortic valve stenosis.   Comparison(s): Unable to access 2018 study, further atrial dilation from  prior report.     Labs/Other Tests and Data Reviewed:    EKG:   afib rate 92 nonspecific ST changes 2018  Recent Labs: No results found for requested labs within last 365 days.   Recent Lipid Panel Lab Results  Component Value Date/Time   CHOL 93 10/15/2015 02:14 AM   TRIG 27 10/15/2015 02:14 AM   HDL 53 10/15/2015 02:14 AM   CHOLHDL 1.8 10/15/2015 02:14 AM   LDLCALC 35 10/15/2015 02:14 AM    Wt Readings from Last 3 Encounters:  06/17/23 166 lb 6.4 oz (75.5 kg)  12/04/22 170 lb 6.4 oz (77.3 kg)  06/04/22 177 lb (80.3 kg)     Objective:    Vital Signs:  BP 118/60   Pulse 61   Ht 6' (1.829 m)   Wt 166 lb 6.4 oz (75.5 kg)   SpO2 97%   BMI 22.57 kg/m    Affect appropriate Frail elderly male  HEENT: normal Neck supple with no adenopathy JVP normal no bruits no thyromegaly Lungs clear with no wheezing and good diaphragmatic motion Heart:  S1/S2 no murmur, no rub, gallop or click PMI normal Abdomen: benighn, BS positve, no tenderness, no AAA no bruit.  No HSM or HJR Distal pulses intact with no bruits Plus one bilateral  edema Neuro non-focal Skin warm and dry No muscular weakness   ASSESSMENT & PLAN:    1. PAF - asymptomatic continue anticoagulation with eliquis  GFR is 6 and age will reduce dose to 2.5 mg bid    2. HTN: losartan d/c    3. AR:  Trivial by most recent echo 05/12/22     4. Diastolic Dysfunction/Right heart failure :  he appears dry decrease lasix to  daily  5. Weakness:  check labs TSH/CBC/BMET f/u primary   6. Dental:  hold eliquis 2 days before dental procedure he is on antibiotics   Decrease lasix 40 mg daily Hold eliquis 48 hours before dental procedure TSH/CBC/BMET today  Disposition:  Follow up in 6 months    Signed, Charlton Haws, MD  06/17/2023 9:03 AM    [facility]

## 2023-06-17 ENCOUNTER — Ambulatory Visit: Payer: Medicare Other | Attending: Cardiovascular Disease | Admitting: Cardiovascular Disease

## 2023-06-17 ENCOUNTER — Encounter: Payer: Self-pay | Admitting: Cardiovascular Disease

## 2023-06-17 ENCOUNTER — Other Ambulatory Visit: Payer: Self-pay

## 2023-06-17 VITALS — BP 118/60 | HR 61 | Ht 72.0 in | Wt 166.4 lb

## 2023-06-17 DIAGNOSIS — I351 Nonrheumatic aortic (valve) insufficiency: Secondary | ICD-10-CM

## 2023-06-17 DIAGNOSIS — I480 Paroxysmal atrial fibrillation: Secondary | ICD-10-CM

## 2023-06-17 DIAGNOSIS — I5032 Chronic diastolic (congestive) heart failure: Secondary | ICD-10-CM | POA: Diagnosis not present

## 2023-06-17 MED ORDER — FUROSEMIDE 40 MG PO TABS: 40.0000 mg | ORAL_TABLET | Freq: Every day | ORAL | 3 refills | Status: DC

## 2023-06-17 NOTE — Telephone Encounter (Signed)
Pt's medication was sent to pt's pharmacy as requested. Confirmation received.  °

## 2023-06-17 NOTE — Patient Instructions (Addendum)
Medication Instructions:  Your physician has recommended you make the following change in your medication:  1- Decrease Furosemide 40 mg by mouth daily. *If you need a refill on your cardiac medications before your next appointment, please call your pharmacy*  Lab Work: Your physician recommends that you have lab work today- BMET, CBC, TSH  If you have labs (blood work) drawn today and your tests are completely normal, you will receive your results only by: MyChart Message (if you have MyChart) OR A paper copy in the mail If you have any lab test that is abnormal or we need to change your treatment, we will call you to review the results.  Testing/Procedures: None ordered today.  Follow-Up: At [facility], you and your health needs are our priority.  As part of our continuing mission to provide you with exceptional heart care, we have created designated Provider Care Teams.  These Care Teams include your primary Cardiologist (physician) and Advanced Practice Providers (APPs -  Physician Assistants and Nurse Practitioners) who all work together to provide you with the care you need, when you need it.  We recommend signing up for the patient portal called "MyChart".  Sign up information is provided on this After Visit Summary.  MyChart is used to connect with patients for Virtual Visits (Telemedicine).  Patients are able to view lab/test results, encounter notes, upcoming appointments, etc.  Non-urgent messages can be sent to your provider as well.   To learn more about what you can do with MyChart, go to [email or web address].    Your next appointment:   6 month(s)  Provider:   Charlton Haws, MD

## 2023-06-18 LAB — CBC
Hematocrit: 45.1 % (ref 37.5–51.0)
Hemoglobin: 14.5 g/dL (ref 13.0–17.7)
MCH: 30.5 pg (ref 26.6–33.0)
MCHC: 32.2 g/dL (ref 31.5–35.7)
MCV: 95 fL (ref 79–97)
Platelets: 184 10*3/uL (ref 150–450)
RBC: 4.75 x10E6/uL (ref 4.14–5.80)
RDW: 13.3 % (ref 11.6–15.4)
WBC: 4.9 10*3/uL (ref 3.4–10.8)

## 2023-06-18 LAB — BASIC METABOLIC PANEL
BUN/Creatinine Ratio: 13 (ref 10–24)
BUN: 13 mg/dL (ref 10–36)
CO2: 26 mmol/L (ref 20–29)
Calcium: 9 mg/dL (ref 8.6–10.2)
Chloride: 100 mmol/L (ref 96–106)
Creatinine, Ser: 1.02 mg/dL (ref 0.76–1.27)
Glucose: 142 mg/dL — ABNORMAL HIGH (ref 70–99)
Potassium: 4 mmol/L (ref 3.5–5.2)
Sodium: 141 mmol/L (ref 134–144)
eGFR: 68 mL/min/{1.73_m2} (ref >59)

## 2023-06-18 LAB — TSH: TSH: 4.13 u[IU]/mL (ref 0.450–4.500)

## 2023-07-07 DIAGNOSIS — H6123 Impacted cerumen, bilateral: Secondary | ICD-10-CM | POA: Diagnosis not present

## 2023-07-21 ENCOUNTER — Telehealth: Payer: Self-pay | Admitting: *Deleted

## 2023-07-21 NOTE — Telephone Encounter (Signed)
   Pre-operative Risk Assessment    Patient Name: Barry Burgess  DOB: January 28, 1926 MRN: [id number]     Request for Surgical Clearance    Procedure:  Dental Extraction - Amount of Teeth to be Pulled:  2 TEETH FOR EXTRACTION  Date of Surgery:  Clearance TUESDAY 07/27/23                                Surgeon:  NOT LISTED Surgeon's Group or Practice Name:  [facility] Phone number:  [phone] Fax number:  [phone]   Type of Clearance Requested:   - Medical  - Pharmacy:  Hold Apixaban (Eliquis) x 2 DAYS PRIOR   Type of Anesthesia:  Local    Additional requests/questions:    [name]   07/21/2023, 5:26 PM

## 2023-07-21 NOTE — Telephone Encounter (Signed)
   Patient Name: Barry Burgess  DOB: 1926-11-11 MRN: [id number]  Primary Cardiologist: Charlton Haws, MD  Chart reviewed as part of pre-operative protocol coverage.    Simple dental extractions (i.e. 1-2 teeth) are considered low risk procedures per guidelines and generally do not require any specific cardiac clearance. It is also generally accepted that for simple extractions and dental cleanings, there is no need to interrupt blood thinner therapy.   SBE prophylaxis is not required for the patient from a cardiac standpoint.  I will route this recommendation to the requesting party via Epic fax function and remove from pre-op pool.  Please call with questions.  Napoleon Form, Leodis Rains, NP 07/21/2023, 6:17 PM

## 2023-08-03 ENCOUNTER — Telehealth: Payer: Self-pay | Admitting: Cardiovascular Disease

## 2023-08-03 NOTE — Telephone Encounter (Signed)
Left message for patient to call back  

## 2023-08-03 NOTE — Telephone Encounter (Signed)
Pt c/o medication issue:  1. Name of Medication:   furosemide (LASIX) 40 MG tablet   2. How are you currently taking this medication (dosage and times per day)?  2 tablets daily  3. Are you having a reaction (difficulty breathing--STAT)?   No  4. What is your medication issue?   Son stated patient was taking 2 tablets but was going to the bathroom too much and cut the dosage down to 1 tablet daily. Son states patient is retaining fluid again and wants to increase the dosage to 2 tablets daily.  Son stated patient wants a new prescription sent to [id number] - [place], [place] - [address].

## 2023-08-03 NOTE — Telephone Encounter (Signed)
Will forward to Dr. Eden Emms for advisement.  06/17/23 Office visit with Dr. Eden Emms- "Diastolic Dysfunction/Right heart failure :  he appears dry decrease lasix to daily"  06/17/23  Lab work Creatinine 1.02, BUN 13, and  eGFR 68

## 2023-08-04 MED ORDER — FUROSEMIDE 40 MG PO TABS: 40.0000 mg | ORAL_TABLET | Freq: Two times a day (BID) | ORAL | 3 refills | Status: DC

## 2023-08-04 NOTE — Telephone Encounter (Signed)
Pt's son returning call. °

## 2023-08-04 NOTE — Telephone Encounter (Signed)
Per Dr. Eden Emms, ok to take bid and call script in   Called patient's son back (DPR) and let him know Dr. Eden Emms was fine sending in for lasix BID. Patient's son thank me for the call.

## 2023-08-12 ENCOUNTER — Encounter (HOSPITAL_COMMUNITY): Payer: Self-pay

## 2023-08-12 ENCOUNTER — Telehealth: Payer: Self-pay | Admitting: Cardiovascular Disease

## 2023-08-12 ENCOUNTER — Other Ambulatory Visit: Payer: Self-pay

## 2023-08-12 ENCOUNTER — Emergency Department (HOSPITAL_COMMUNITY): Payer: Medicare Other

## 2023-08-12 ENCOUNTER — Inpatient Hospital Stay (HOSPITAL_COMMUNITY)
Admission: EM | Admit: 2023-08-12 | Discharge: 2023-08-18 | DRG: 871 | Disposition: A | Discharge status: Home Health | Payer: Medicare Other | Attending: Internal Medicine | Admitting: Internal Medicine

## 2023-08-12 DIAGNOSIS — Z7901 Long term (current) use of anticoagulants: Secondary | ICD-10-CM

## 2023-08-12 DIAGNOSIS — I959 Hypotension, unspecified: Secondary | ICD-10-CM | POA: Diagnosis present

## 2023-08-12 DIAGNOSIS — T68XXXA Hypothermia, initial encounter: Principal | ICD-10-CM

## 2023-08-12 DIAGNOSIS — M7989 Other specified soft tissue disorders: Secondary | ICD-10-CM | POA: Diagnosis present

## 2023-08-12 DIAGNOSIS — N179 Acute kidney failure, unspecified: Secondary | ICD-10-CM | POA: Diagnosis not present

## 2023-08-12 DIAGNOSIS — J9601 Acute respiratory failure with hypoxia: Secondary | ICD-10-CM | POA: Diagnosis present

## 2023-08-12 DIAGNOSIS — I5032 Chronic diastolic (congestive) heart failure: Secondary | ICD-10-CM | POA: Diagnosis not present

## 2023-08-12 DIAGNOSIS — D696 Thrombocytopenia, unspecified: Secondary | ICD-10-CM | POA: Diagnosis not present

## 2023-08-12 DIAGNOSIS — J189 Pneumonia, unspecified organism: Secondary | ICD-10-CM | POA: Diagnosis not present

## 2023-08-12 DIAGNOSIS — I499 Cardiac arrhythmia, unspecified: Secondary | ICD-10-CM | POA: Diagnosis not present

## 2023-08-12 DIAGNOSIS — F05 Delirium due to known physiological condition: Secondary | ICD-10-CM | POA: Diagnosis not present

## 2023-08-12 DIAGNOSIS — N400 Enlarged prostate without lower urinary tract symptoms: Secondary | ICD-10-CM | POA: Diagnosis present

## 2023-08-12 DIAGNOSIS — I872 Venous insufficiency (chronic) (peripheral): Secondary | ICD-10-CM | POA: Diagnosis not present

## 2023-08-12 DIAGNOSIS — H9190 Unspecified hearing loss, unspecified ear: Secondary | ICD-10-CM | POA: Diagnosis not present

## 2023-08-12 DIAGNOSIS — R001 Bradycardia, unspecified: Secondary | ICD-10-CM | POA: Diagnosis not present

## 2023-08-12 DIAGNOSIS — R918 Other nonspecific abnormal finding of lung field: Secondary | ICD-10-CM | POA: Diagnosis not present

## 2023-08-12 DIAGNOSIS — R262 Difficulty in walking, not elsewhere classified: Secondary | ICD-10-CM | POA: Diagnosis present

## 2023-08-12 DIAGNOSIS — R6889 Other general symptoms and signs: Secondary | ICD-10-CM | POA: Diagnosis not present

## 2023-08-12 DIAGNOSIS — Z66 Do not resuscitate: Secondary | ICD-10-CM | POA: Diagnosis present

## 2023-08-12 DIAGNOSIS — R680 Hypothermia, not associated with low environmental temperature: Secondary | ICD-10-CM | POA: Diagnosis not present

## 2023-08-12 DIAGNOSIS — I4819 Other persistent atrial fibrillation: Secondary | ICD-10-CM | POA: Diagnosis present

## 2023-08-12 DIAGNOSIS — E039 Hypothyroidism, unspecified: Secondary | ICD-10-CM | POA: Diagnosis present

## 2023-08-12 DIAGNOSIS — R9082 White matter disease, unspecified: Secondary | ICD-10-CM | POA: Diagnosis not present

## 2023-08-12 DIAGNOSIS — I5082 Biventricular heart failure: Secondary | ICD-10-CM | POA: Diagnosis not present

## 2023-08-12 DIAGNOSIS — H50111 Monocular exotropia, right eye: Secondary | ICD-10-CM | POA: Diagnosis not present

## 2023-08-12 DIAGNOSIS — Z743 Need for continuous supervision: Secondary | ICD-10-CM | POA: Diagnosis not present

## 2023-08-12 DIAGNOSIS — R000 Tachycardia, unspecified: Secondary | ICD-10-CM | POA: Diagnosis not present

## 2023-08-12 DIAGNOSIS — E860 Dehydration: Secondary | ICD-10-CM | POA: Diagnosis present

## 2023-08-12 DIAGNOSIS — Z79899 Other long term (current) drug therapy: Secondary | ICD-10-CM

## 2023-08-12 DIAGNOSIS — Z87891 Personal history of nicotine dependence: Secondary | ICD-10-CM

## 2023-08-12 DIAGNOSIS — R627 Adult failure to thrive: Secondary | ICD-10-CM | POA: Diagnosis present

## 2023-08-12 DIAGNOSIS — A419 Sepsis, unspecified organism: Principal | ICD-10-CM | POA: Diagnosis present

## 2023-08-12 DIAGNOSIS — I10 Essential (primary) hypertension: Secondary | ICD-10-CM | POA: Diagnosis present

## 2023-08-12 DIAGNOSIS — I480 Paroxysmal atrial fibrillation: Secondary | ICD-10-CM | POA: Diagnosis present

## 2023-08-12 DIAGNOSIS — I495 Sick sinus syndrome: Secondary | ICD-10-CM | POA: Diagnosis present

## 2023-08-12 DIAGNOSIS — I110 Hypertensive heart disease with heart failure: Secondary | ICD-10-CM | POA: Diagnosis not present

## 2023-08-12 DIAGNOSIS — H409 Unspecified glaucoma: Secondary | ICD-10-CM | POA: Diagnosis present

## 2023-08-12 DIAGNOSIS — L89310 Pressure ulcer of right buttock, unstageable: Secondary | ICD-10-CM | POA: Diagnosis present

## 2023-08-12 DIAGNOSIS — R4182 Altered mental status, unspecified: Secondary | ICD-10-CM

## 2023-08-12 DIAGNOSIS — G928 Other toxic encephalopathy: Secondary | ICD-10-CM | POA: Diagnosis not present

## 2023-08-12 DIAGNOSIS — N390 Urinary tract infection, site not specified: Secondary | ICD-10-CM | POA: Diagnosis present

## 2023-08-12 DIAGNOSIS — K449 Diaphragmatic hernia without obstruction or gangrene: Secondary | ICD-10-CM | POA: Diagnosis not present

## 2023-08-12 DIAGNOSIS — J90 Pleural effusion, not elsewhere classified: Secondary | ICD-10-CM | POA: Diagnosis not present

## 2023-08-12 DIAGNOSIS — G9341 Metabolic encephalopathy: Secondary | ICD-10-CM | POA: Diagnosis present

## 2023-08-12 DIAGNOSIS — R54 Age-related physical debility: Secondary | ICD-10-CM | POA: Diagnosis present

## 2023-08-12 DIAGNOSIS — E785 Hyperlipidemia, unspecified: Secondary | ICD-10-CM | POA: Diagnosis present

## 2023-08-12 DIAGNOSIS — I517 Cardiomegaly: Secondary | ICD-10-CM | POA: Diagnosis not present

## 2023-08-12 DIAGNOSIS — N3000 Acute cystitis without hematuria: Secondary | ICD-10-CM | POA: Diagnosis not present

## 2023-08-12 DIAGNOSIS — R531 Weakness: Secondary | ICD-10-CM | POA: Diagnosis not present

## 2023-08-12 DIAGNOSIS — R748 Abnormal levels of other serum enzymes: Secondary | ICD-10-CM | POA: Diagnosis not present

## 2023-08-12 LAB — PROTIME-INR
INR: 1.4 — ABNORMAL HIGH (ref 0.8–1.2)
Prothrombin Time: 17.8 seconds — ABNORMAL HIGH (ref 11.4–15.2)

## 2023-08-12 LAB — COMPREHENSIVE METABOLIC PANEL
ALT: 68 U/L — ABNORMAL HIGH (ref 0–44)
AST: 88 U/L — ABNORMAL HIGH (ref 15–41)
Albumin: 3.8 g/dL (ref 3.5–5.0)
Alkaline Phosphatase: 77 U/L (ref 38–126)
Anion gap: 10 (ref 5–15)
BUN: 28 mg/dL — ABNORMAL HIGH (ref 8–23)
CO2: 28 mmol/L (ref 22–32)
Calcium: 8.8 mg/dL — ABNORMAL LOW (ref 8.9–10.3)
Chloride: 96 mmol/L — ABNORMAL LOW (ref 98–111)
Creatinine, Ser: 1.26 mg/dL — ABNORMAL HIGH (ref 0.61–1.24)
GFR, Estimated: 53 mL/min — ABNORMAL LOW (ref >60)
Glucose, Bld: 134 mg/dL — ABNORMAL HIGH (ref 70–99)
Potassium: 3.9 mmol/L (ref 3.5–5.1)
Sodium: 134 mmol/L — ABNORMAL LOW (ref 135–145)
Total Bilirubin: 0.5 mg/dL (ref 0.3–1.2)
Total Protein: 7.1 g/dL (ref 6.5–8.1)

## 2023-08-12 LAB — CBC WITH DIFFERENTIAL/PLATELET
Abs Immature Granulocytes: 0.03 10*3/uL (ref 0.00–0.07)
Basophils Absolute: 0 10*3/uL (ref 0.0–0.1)
Basophils Relative: 1 %
Eosinophils Absolute: 0 10*3/uL (ref 0.0–0.5)
Eosinophils Relative: 1 %
HCT: 37.7 % — ABNORMAL LOW (ref 39.0–52.0)
Hemoglobin: 12.1 g/dL — ABNORMAL LOW (ref 13.0–17.0)
Immature Granulocytes: 1 %
Lymphocytes Relative: 15 %
Lymphs Abs: 0.7 10*3/uL (ref 0.7–4.0)
MCH: 30 pg (ref 26.0–34.0)
MCHC: 32.1 g/dL (ref 30.0–36.0)
MCV: 93.5 fL (ref 80.0–100.0)
Monocytes Absolute: 0.4 10*3/uL (ref 0.1–1.0)
Monocytes Relative: 8 %
Neutro Abs: 3.8 10*3/uL (ref 1.7–7.7)
Neutrophils Relative %: 74 %
Platelets: 114 10*3/uL — ABNORMAL LOW (ref 150–400)
RBC: 4.03 MIL/uL — ABNORMAL LOW (ref 4.22–5.81)
RDW: 15.2 % (ref 11.5–15.5)
WBC: 5.1 10*3/uL (ref 4.0–10.5)
nRBC: 0 % (ref 0.0–0.2)

## 2023-08-12 LAB — AMMONIA: Ammonia: 26 umol/L (ref 9–35)

## 2023-08-12 LAB — APTT: aPTT: 40 seconds — ABNORMAL HIGH (ref 24–36)

## 2023-08-12 NOTE — ED Provider Notes (Incomplete)
  [place] EMERGENCY DEPARTMENT AT [facility] Provider Note   CSN: [id number] Arrival date & time: 08/12/23  2207     History {Add pertinent medical, surgical, social history, OB history to HPI:1} Chief Complaint  Patient presents with  . Weakness    Barry Burgess is a 89 y.o. male.  HPI     Home Medications Prior to Admission medications   Medication Sig Start Date End Date Taking? Authorizing Provider  amoxicillin (AMOXIL) 500 MG tablet Take 500 mg by mouth 3 (three) times daily. 06/09/23   [provider]  dorzolamide-timolol (COSOPT) 22.3-6.8 MG/ML ophthalmic solution Place 1 drop into both eyes 2 (two) times daily. 10/07/15   [provider]  doxazosin (CARDURA) 4 MG tablet Take 4 mg by mouth daily. 10/11/15   [provider]  ELIQUIS 5 MG TABS tablet TAKE 1 TABLET BY MOUTH TWICE  DAILY 04/26/23   Wendall Stade, MD  finasteride (PROSCAR) 5 MG tablet Take 5 mg by mouth daily. 08/14/15   [provider]  furosemide (LASIX) 40 MG tablet Take 1 tablet (40 mg total) by mouth 2 (two) times daily. 08/04/23   Wendall Stade, MD  latanoprost (XALATAN) 0.005 % ophthalmic solution Place 1 drop into both eyes every morning. 10/07/15   [provider]  pravastatin (PRAVACHOL) 80 MG tablet Take 40 mg by mouth daily.  11/25/15   [provider]      Allergies    Patient has no known allergies.    Review of Systems   Review of Systems  Physical Exam Updated Vital Signs BP 99/66   Pulse (!) 59   Temp (!) 92.2 F (33.4 C) (Rectal)   Resp 16   Ht 6' (1.829 m)   Wt 87.5 kg   SpO2 95%   BMI 26.16 kg/m  Physical Exam  ED Results / Procedures / Treatments   Labs (all labs ordered are listed, but only abnormal results are displayed) Labs Reviewed  CULTURE, BLOOD (ROUTINE X 2)  CULTURE, BLOOD (ROUTINE X 2)  COMPREHENSIVE METABOLIC PANEL  CBC WITH DIFFERENTIAL/PLATELET  PROTIME-INR  APTT  URINALYSIS, W/  REFLEX TO CULTURE (INFECTION SUSPECTED)  TSH  T3, FREE  T4, FREE  BRAIN NATRIURETIC PEPTIDE  AMMONIA  I-STAT CG4 LACTIC ACID, ED    EKG None  Radiology No results found.  Procedures Procedures  {Document cardiac monitor, telemetry assessment procedure when appropriate:1}  Medications Ordered in ED Medications - No data to display  ED Course/ Medical Decision Making/ A&P   {   Click here for ABCD2, HEART and other calculatorsREFRESH Note before signing :1}                              Medical Decision Making Amount and/or Complexity of Data Reviewed Labs: ordered. Radiology: ordered. ECG/medicine tests: ordered.   ***  {Document critical care time when appropriate:1} {Document review of labs and clinical decision tools ie heart score, Chads2Vasc2 etc:1}  {Document your independent review of radiology images, and any outside records:1} {Document your discussion with family members, caretakers, and with consultants:1} {Document social determinants of health affecting pt's care:1} {Document your decision making why or why not admission, treatments were needed:1} Final Clinical Impression(s) / ED Diagnoses Final diagnoses:  None    Rx / DC Orders ED Discharge Orders     None

## 2023-08-12 NOTE — Telephone Encounter (Signed)
Called patient's son back Patient's gait is off, patient shuffling around.  Patient complaining of shaking, but son states patient is still.  Vial signs- HR 70 PO2 99% BP 103/67 Temp 97. Patient still has BLE edema and left leg is worse then right. Son stated patient's appetite is good, but he is not drinking enough.  With patient's BP being low, encouraged fluid intake. Patient's son stated that patient did have slurred speech, but is clear now. Will forward to Dr. Eden Emms for advisement.

## 2023-08-12 NOTE — ED Triage Notes (Signed)
BIB EMS from home for weakness and hypotension. Recent change in lasix and pt has not been drinking much water due to not wanting to get up to urinate because of weakness. 90/60 on EMS arrival. Last BP 102/60 with EMS.  Hx of afib.

## 2023-08-12 NOTE — Telephone Encounter (Signed)
Patient is calling to speak directly to [facility]

## 2023-08-12 NOTE — ED Provider Notes (Signed)
[place] EMERGENCY DEPARTMENT AT [facility] Provider Note   CSN: [id number] Arrival date & time: 08/12/23  2207     History  Chief Complaint  Patient presents with   Weakness    Barry Burgess is a 89 y.o. male.  89 year old male brought in by EMS from home, son and daughter-in-law at bedside. Family notes patient has been confused this week off an on, has not been drinking well as he does not want to get up and go to the bathroom. Normally takes 1 lasix daily, told to increase to 2 daily, family has been pushing fluids but patient remains confused and family states normally drinking more water helps improve his mental status. Family notes BP has been low at home, also not improving with fluids. Per EMS, 90/60 on their arrival. Patient is a poor historian, states he feels weak.        Home Medications Prior to Admission medications   Medication Sig Start Date End Date Taking? Authorizing Provider  dorzolamide-timolol (COSOPT) 22.3-6.8 MG/ML ophthalmic solution Place 1 drop into both eyes 2 (two) times daily. 10/07/15  Yes [provider]  doxazosin (CARDURA) 4 MG tablet Take 4 mg by mouth daily. 10/11/15  Yes [provider]  ELIQUIS 5 MG TABS tablet TAKE 1 TABLET BY MOUTH TWICE  DAILY 04/26/23  Yes Wendall Stade, MD  finasteride (PROSCAR) 5 MG tablet Take 5 mg by mouth daily. 08/14/15  Yes [provider]  furosemide (LASIX) 40 MG tablet Take 1 tablet (40 mg total) by mouth 2 (two) times daily. 08/04/23  Yes Wendall Stade, MD  latanoprost (XALATAN) 0.005 % ophthalmic solution Place 1 drop into both eyes every morning. 10/07/15  Yes [provider]  pravastatin (PRAVACHOL) 80 MG tablet Take 40 mg by mouth daily.  11/25/15  Yes [provider]  amoxicillin (AMOXIL) 500 MG tablet Take 500 mg by mouth 3 (three) times daily. Patient not taking: Reported on 08/12/2023 06/09/23   [provider]      Allergies     Patient has no known allergies.    Review of Systems   Review of Systems Level 5 caveat for change in mental status Physical Exam Updated Vital Signs BP (!) 102/52   Pulse 61   Temp (!) 97.4 F (36.3 C) (Oral)   Resp 18   Ht 6' (1.829 m)   Wt 87.5 kg   SpO2 97%   BMI 26.16 kg/m  Physical Exam Vitals and nursing note reviewed.  Constitutional:      General: He is not in acute distress.    Appearance: He is not toxic-appearing.     Comments: Wakes to verbal stimuli  HENT:     Head: Normocephalic and atraumatic.     Nose: Nose normal.     Mouth/Throat:     Mouth: Mucous membranes are dry.  Eyes:     Conjunctiva/sclera: Conjunctivae normal.  Cardiovascular:     Rate and Rhythm: Bradycardia present. Rhythm irregular.     Heart sounds: Normal heart sounds.  Pulmonary:     Effort: Pulmonary effort is normal.     Breath sounds: Normal breath sounds.  Abdominal:     Palpations: Abdomen is soft.     Tenderness: There is no abdominal tenderness.  Musculoskeletal:     Cervical back: Neck supple.     Right lower leg: Edema present.     Left lower leg: Edema present.  Comments: Wounds x 2 to anterior right lower leg, mild serous drainage, no notable surrounding erythema. Pitting edema to bilateral lower extremities   Skin:    General: Skin is warm and dry.     Findings: No erythema or rash.  Neurological:     General: No focal deficit present.     ED Results / Procedures / Treatments   Labs (all labs ordered are listed, but only abnormal results are displayed) Labs Reviewed  COMPREHENSIVE METABOLIC PANEL - Abnormal; Notable for the following components:      Result Value   Sodium 134 (*)    Chloride 96 (*)    Glucose, Bld 134 (*)    BUN 28 (*)    Creatinine, Ser 1.26 (*)    Calcium 8.8 (*)    AST 88 (*)    ALT 68 (*)    GFR, Estimated 53 (*)    All other components within normal limits  CBC WITH DIFFERENTIAL/PLATELET - Abnormal; Notable for the following  components:   RBC 4.03 (*)    Hemoglobin 12.1 (*)    HCT 37.7 (*)    Platelets 114 (*)    All other components within normal limits  PROTIME-INR - Abnormal; Notable for the following components:   Prothrombin Time 17.8 (*)    INR 1.4 (*)    All other components within normal limits  APTT - Abnormal; Notable for the following components:   aPTT 40 (*)    All other components within normal limits  URINALYSIS, W/ REFLEX TO CULTURE (INFECTION SUSPECTED) - Abnormal; Notable for the following components:   Hgb urine dipstick SMALL (*)    Protein, ur 30 (*)    Nitrite POSITIVE (*)    Leukocytes,Ua TRACE (*)    Bacteria, UA FEW (*)    All other components within normal limits  TSH - Abnormal; Notable for the following components:   TSH 7.937 (*)    All other components within normal limits  BRAIN NATRIURETIC PEPTIDE - Abnormal; Notable for the following components:   B Natriuretic Peptide 217.0 (*)    All other components within normal limits  CULTURE, BLOOD (ROUTINE X 2)  CULTURE, BLOOD (ROUTINE X 2)  URINE CULTURE  AMMONIA  LACTIC ACID, PLASMA  T3, FREE  T4, FREE  URINALYSIS, W/ REFLEX TO CULTURE (INFECTION SUSPECTED)    EKG None  Radiology CT Head Wo Contrast  Result Date: 08/12/2023 CLINICAL DATA:  Mental status change, unknown cause EXAM: CT HEAD WITHOUT CONTRAST TECHNIQUE: Contiguous axial images were obtained from the base of the skull through the vertex without intravenous contrast. RADIATION DOSE REDUCTION: This exam was performed according to the departmental dose-optimization program which includes automated exposure control, adjustment of the mA and/or kV according to patient size and/or use of iterative reconstruction technique. COMPARISON:  None Available. FINDINGS: Brain: Patchy chronic small vessel disease throughout the deep white matter. No acute intracranial abnormality. Specifically, no hemorrhage, hydrocephalus, mass lesion, acute infarction, or significant  intracranial injury. Vascular: No hyperdense vessel or unexpected calcification. Skull: No acute calvarial abnormality. Sinuses/Orbits: No acute findings Other: None IMPRESSION: No acute intracranial abnormality. Patchy chronic small vessel disease. Electronically Signed   By: Charlett Nose M.D.   On: 08/12/2023 23:33   DG Chest Port 1 View  Result Date: 08/12/2023 CLINICAL DATA:  Questionable sepsis EXAM: PORTABLE CHEST 1 VIEW COMPARISON:  Chest x-ray 05/11/2022 FINDINGS: Large hiatal hernia is again seen. The heart is enlarged, unchanged. There are small bilateral pleural  effusions, left greater than right. There some infiltrates in the left lung base. Pneumothorax or acute fracture. IMPRESSION: 1. Cardiomegaly. 2. Small bilateral pleural effusions, left greater than right. 3. Infiltrate in the left lung base. 4. Large hiatal hernia. Electronically Signed   By: Darliss Cheney M.D.   On: 08/12/2023 23:28    Procedures .Critical Care  Performed by: Jeannie Fend, PA-C Authorized by: Jeannie Fend, PA-C   Critical care provider statement:    Critical care time (minutes):  30   Critical care was time spent personally by me on the following activities:  Development of treatment plan with patient or surrogate, discussions with consultants, evaluation of patient's response to treatment, examination of patient, ordering and review of laboratory studies, ordering and review of radiographic studies, ordering and performing treatments and interventions, pulse oximetry, re-evaluation of patient's condition and review of old charts     Medications Ordered in ED Medications  cefTRIAXone (ROCEPHIN) 1 g in sodium chloride 0.9 % 100 mL IVPB (1 g Intravenous New Bag/Given 08/13/23 0234)  finasteride (PROSCAR) tablet 5 mg (has no administration in time range)  apixaban (ELIQUIS) tablet 5 mg (has no administration in time range)  doxazosin (CARDURA) tablet 4 mg (has no administration in time range)  furosemide  (LASIX) tablet 40 mg (has no administration in time range)  pravastatin (PRAVACHOL) tablet 40 mg (has no administration in time range)  latanoprost (XALATAN) 0.005 % ophthalmic solution 1 drop (has no administration in time range)  dorzolamide-timolol (COSOPT) 2-0.5 % ophthalmic solution 1 drop (has no administration in time range)    ED Course/ Medical Decision Making/ A&P                                 Medical Decision Making Amount and/or Complexity of Data Reviewed Labs: ordered. Radiology: ordered. ECG/medicine tests: ordered.  Risk Decision regarding hospitalization.   This patient presents to the ED for concern of confusion, this involves an extensive number of treatment options, and is a complaint that carries with it a high risk of complications and morbidity.  The differential diagnosis includes but not limited to sepsis, UTI, PNA, CHF, CVA, brain mass, electrolyte/metabolic disturbance, myxedema   Co morbidities that complicate the patient evaluation  Hypertension, hyperlipidemia, paroxysmal A-fib, AKI, CHF   Additional history obtained:  Additional history obtained from EMS as detailed per HPI.  Son and daughter-in-law at bedside who also provide history. External records from outside source obtained and reviewed including prior echo on file dated 05/12/2022 with EF of 50 to 55%.   Lab Tests:  I Ordered, and personally interpreted labs.  The pertinent results include: CBC with normal white count, hemoglobin 12.1.  CMP with elevated creatinine 1.26, not significantly elevated compared to prior file.  AST elevated 88, ALT elevated at 68.  BNP minimally elevated 217.  TSH is elevated at 7.9.  Urinalysis with small hemoglobin, protein, nitrite positive, leukocytes with few bacteria, will add culture.  Lactic acid reassuring at 1.0.   Imaging Studies ordered:  I ordered imaging studies including CXR, CT head  I independently visualized and interpreted imaging which  showed CT head unremarkable, CXR with cardiomegaly, atypical right heart border I agree with the radiologist interpretation   Cardiac Monitoring: / EKG:  The patient was maintained on a cardiac monitor.  I personally viewed and interpreted the cardiac monitored which showed an underlying rhythm of: afib on monitor with  rate of 50   Consultations Obtained:  I requested consultation with the ER attending Dr. Doran Durand,  and discussed lab and imaging findings as well as pertinent plan - they recommend: bedside cardiac US performed. Recommends add on thyroid panel, plan for admission to hospital.  Case discussed with Dr. Haroldine Laws with [facility] hospitalist service will consult for admission.   Problem List / ED Course / Critical interventions / Medication management  89 year old male brought in by EMS from home for confusion, low blood pressure, edema and weakness.  EMS found patient to blood pressure of 90/60.  On arrival, patient is hypothermic with rectal temperature of 92.2.  Patient was placed under [employer]  Chest x-ray is abnormal, found cardiomegaly, hazy abnormality along the right heart border.  Radiologist questions left lower infiltrate, this is not well-appreciated on this single view chest x-ray.  Patient does have elevated TSH.  Urinalysis with nitrites and leukocytes, only 0-5 WBCs and few bacteria, will add culture.  Consider possible UTI although may not be will appreciate on urinalysis due to Lasix and hydration from family at home.  Discussed with hospitalist who agrees with treating with Rocephin and will admit.  I have reviewed the patients home medicines and have made adjustments as needed   Social Determinants of Health:  Lives with family    Test / Admission - Considered:  Admit          Final Clinical Impression(s) / ED Diagnoses Final diagnoses:  Hypothermia, initial encounter  Altered mental status, unspecified altered mental status type   Hypothyroidism, unspecified type  Infiltrate of left lung present on chest x-ray    Rx / DC Orders ED Discharge Orders     None         Jeannie Fend, PA-C 08/13/23 1610    Glyn Ade, MD 08/13/23 2029

## 2023-08-12 NOTE — Telephone Encounter (Signed)
Per Dr. Eden Emms, patient needs to follow-up with his PCP.

## 2023-08-12 NOTE — ED Notes (Signed)
Dr. Jearld Fenton notified of pt temperature. Bair Hugger applied to pt

## 2023-08-12 NOTE — ED Provider Notes (Signed)
I was consulted on the care of Mr. Runde and evaluated personally bedside.  Performed a point-of-care ECHOcardiogram due to hypotension of unknown origin.   EMERGENCY DEPARTMENT [place] CARDIAC EXAM "Study: Limited Ultrasound of the Heart and Pericardium"  INDICATIONS:Cardiac arrest Multiple views of the heart and pericardium were obtained in real-time with a multi-frequency probe.  PERFORMED MV:HQIONG IMAGES ARCHIVED?: Yes LIMITATIONS:  Body habitus VIEWS USED: Subcostal 4 chamber, Parasternal long axis, Parasternal short axis, Apical 4 chamber , and Inferior Vena Cava INTERPRETATION: Cardiac activity present, Pericardial effusioin absent, Cardiac tamponade absent, Probable elevated CVP, Increased contractility, and IVC dilated  Echocardiogram was bizarre.  High ejection fraction (estimated 70% by fractional shortening) EPSS of 0.2. Possible right ventricular wall thickening though difficult views due to patient positioning and EKG leads. Subcostal views without obvious pericardial effusion then any views. IVC dilated and not responding to respiration. Overall his findings raise concern for systemic volume overload in the setting of high ejection fraction.   Differential at this time is sepsis, diastolic heart failure with cardiogenic shock, Pulmonary HTN, versus other cardiomyopathy.  Given his hypothermia and bradycardia I may worry about drug overdose though his medication list does not have beta-blockade or other AV nodal blockade.  Additionally his hypothermia may raise concern for myxedema given his altered mental status. Extensive evaluation ordered per chart.  Remainder of patient care per oncoming attending and PA.  CRITICAL CARE Performed by: Glyn Ade  ?  Total critical care time: 30 minutes  Critical care time was exclusive of separately billable procedures and treating other patients.  Critical care was necessary to treat or prevent imminent or life-threatening  deterioration.  Critical care was time spent personally by me on the following activities: development of treatment plan with patient and/or surrogate as well as nursing, discussions with consultants, evaluation of patient's response to treatment, examination of patient, obtaining history from patient or surrogate, ordering and performing treatments and interventions, ordering and review of laboratory studies, ordering and review of radiographic studies, pulse oximetry and re-evaluation of patient's condition.    Glyn Ade, MD 08/12/23 [phone]

## 2023-08-13 DIAGNOSIS — M7989 Other specified soft tissue disorders: Secondary | ICD-10-CM | POA: Diagnosis present

## 2023-08-13 DIAGNOSIS — D696 Thrombocytopenia, unspecified: Secondary | ICD-10-CM | POA: Diagnosis not present

## 2023-08-13 DIAGNOSIS — T68XXXA Hypothermia, initial encounter: Secondary | ICD-10-CM | POA: Diagnosis not present

## 2023-08-13 DIAGNOSIS — I872 Venous insufficiency (chronic) (peripheral): Secondary | ICD-10-CM | POA: Diagnosis present

## 2023-08-13 DIAGNOSIS — H9190 Unspecified hearing loss, unspecified ear: Secondary | ICD-10-CM | POA: Diagnosis present

## 2023-08-13 DIAGNOSIS — E039 Hypothyroidism, unspecified: Secondary | ICD-10-CM

## 2023-08-13 DIAGNOSIS — N179 Acute kidney failure, unspecified: Secondary | ICD-10-CM | POA: Diagnosis not present

## 2023-08-13 DIAGNOSIS — R918 Other nonspecific abnormal finding of lung field: Secondary | ICD-10-CM | POA: Diagnosis not present

## 2023-08-13 DIAGNOSIS — J90 Pleural effusion, not elsewhere classified: Secondary | ICD-10-CM | POA: Diagnosis not present

## 2023-08-13 DIAGNOSIS — I110 Hypertensive heart disease with heart failure: Secondary | ICD-10-CM | POA: Diagnosis present

## 2023-08-13 DIAGNOSIS — R262 Difficulty in walking, not elsewhere classified: Secondary | ICD-10-CM | POA: Diagnosis present

## 2023-08-13 DIAGNOSIS — J9601 Acute respiratory failure with hypoxia: Secondary | ICD-10-CM | POA: Diagnosis not present

## 2023-08-13 DIAGNOSIS — I5082 Biventricular heart failure: Secondary | ICD-10-CM | POA: Diagnosis not present

## 2023-08-13 DIAGNOSIS — F05 Delirium due to known physiological condition: Secondary | ICD-10-CM | POA: Diagnosis not present

## 2023-08-13 DIAGNOSIS — R531 Weakness: Secondary | ICD-10-CM | POA: Diagnosis not present

## 2023-08-13 DIAGNOSIS — N390 Urinary tract infection, site not specified: Secondary | ICD-10-CM | POA: Diagnosis not present

## 2023-08-13 DIAGNOSIS — A419 Sepsis, unspecified organism: Secondary | ICD-10-CM

## 2023-08-13 DIAGNOSIS — R4182 Altered mental status, unspecified: Secondary | ICD-10-CM | POA: Diagnosis not present

## 2023-08-13 DIAGNOSIS — R680 Hypothermia, not associated with low environmental temperature: Secondary | ICD-10-CM | POA: Diagnosis present

## 2023-08-13 DIAGNOSIS — I959 Hypotension, unspecified: Secondary | ICD-10-CM | POA: Diagnosis present

## 2023-08-13 DIAGNOSIS — H50111 Monocular exotropia, right eye: Secondary | ICD-10-CM | POA: Diagnosis present

## 2023-08-13 DIAGNOSIS — N3000 Acute cystitis without hematuria: Secondary | ICD-10-CM

## 2023-08-13 DIAGNOSIS — J189 Pneumonia, unspecified organism: Secondary | ICD-10-CM | POA: Diagnosis present

## 2023-08-13 DIAGNOSIS — R0602 Shortness of breath: Secondary | ICD-10-CM | POA: Diagnosis not present

## 2023-08-13 DIAGNOSIS — Z66 Do not resuscitate: Secondary | ICD-10-CM | POA: Diagnosis not present

## 2023-08-13 DIAGNOSIS — I5032 Chronic diastolic (congestive) heart failure: Secondary | ICD-10-CM | POA: Diagnosis not present

## 2023-08-13 DIAGNOSIS — I480 Paroxysmal atrial fibrillation: Secondary | ICD-10-CM | POA: Diagnosis present

## 2023-08-13 DIAGNOSIS — R54 Age-related physical debility: Secondary | ICD-10-CM | POA: Diagnosis present

## 2023-08-13 DIAGNOSIS — G928 Other toxic encephalopathy: Secondary | ICD-10-CM | POA: Diagnosis not present

## 2023-08-13 DIAGNOSIS — L89310 Pressure ulcer of right buttock, unstageable: Secondary | ICD-10-CM | POA: Diagnosis not present

## 2023-08-13 DIAGNOSIS — G9341 Metabolic encephalopathy: Secondary | ICD-10-CM | POA: Diagnosis present

## 2023-08-13 DIAGNOSIS — I495 Sick sinus syndrome: Secondary | ICD-10-CM | POA: Diagnosis not present

## 2023-08-13 LAB — DIFFERENTIAL
Abs Immature Granulocytes: 0.02 10*3/uL (ref 0.00–0.07)
Basophils Absolute: 0 10*3/uL (ref 0.0–0.1)
Basophils Relative: 0 %
Eosinophils Absolute: 0 10*3/uL (ref 0.0–0.5)
Eosinophils Relative: 0 %
Immature Granulocytes: 0 %
Lymphocytes Relative: 14 %
Lymphs Abs: 0.6 10*3/uL — ABNORMAL LOW (ref 0.7–4.0)
Monocytes Absolute: 0.4 10*3/uL (ref 0.1–1.0)
Monocytes Relative: 8 %
Neutro Abs: 3.5 10*3/uL (ref 1.7–7.7)
Neutrophils Relative %: 78 %

## 2023-08-13 LAB — URINALYSIS, W/ REFLEX TO CULTURE (INFECTION SUSPECTED)
Bilirubin Urine: NEGATIVE
Glucose, UA: NEGATIVE mg/dL
Ketones, ur: NEGATIVE mg/dL
Nitrite: POSITIVE — AB
Protein, ur: 30 mg/dL — AB
Specific Gravity, Urine: 1.015 (ref 1.005–1.030)
pH: 6 (ref 5.0–8.0)

## 2023-08-13 LAB — COMPREHENSIVE METABOLIC PANEL
ALT: 57 U/L — ABNORMAL HIGH (ref 0–44)
AST: 66 U/L — ABNORMAL HIGH (ref 15–41)
Albumin: 3.2 g/dL — ABNORMAL LOW (ref 3.5–5.0)
Alkaline Phosphatase: 62 U/L (ref 38–126)
Anion gap: 9 (ref 5–15)
BUN: 26 mg/dL — ABNORMAL HIGH (ref 8–23)
CO2: 28 mmol/L (ref 22–32)
Calcium: 8.6 mg/dL — ABNORMAL LOW (ref 8.9–10.3)
Chloride: 99 mmol/L (ref 98–111)
Creatinine, Ser: 1.15 mg/dL (ref 0.61–1.24)
GFR, Estimated: 59 mL/min — ABNORMAL LOW (ref >60)
Glucose, Bld: 127 mg/dL — ABNORMAL HIGH (ref 70–99)
Potassium: 3.9 mmol/L (ref 3.5–5.1)
Sodium: 136 mmol/L (ref 135–145)
Total Bilirubin: 0.5 mg/dL (ref 0.3–1.2)
Total Protein: 6 g/dL — ABNORMAL LOW (ref 6.5–8.1)

## 2023-08-13 LAB — CBC
HCT: 32.1 % — ABNORMAL LOW (ref 39.0–52.0)
Hemoglobin: 10.7 g/dL — ABNORMAL LOW (ref 13.0–17.0)
MCH: 31 pg (ref 26.0–34.0)
MCHC: 33.3 g/dL (ref 30.0–36.0)
MCV: 93 fL (ref 80.0–100.0)
Platelets: 107 10*3/uL — ABNORMAL LOW (ref 150–400)
RBC: 3.45 MIL/uL — ABNORMAL LOW (ref 4.22–5.81)
RDW: 15.3 % (ref 11.5–15.5)
WBC: 4.7 10*3/uL (ref 4.0–10.5)
nRBC: 0 % (ref 0.0–0.2)

## 2023-08-13 LAB — BRAIN NATRIURETIC PEPTIDE: B Natriuretic Peptide: 217 pg/mL — ABNORMAL HIGH (ref 0.0–100.0)

## 2023-08-13 LAB — LACTIC ACID, PLASMA: Lactic Acid, Venous: 1 mmol/L (ref 0.5–1.9)

## 2023-08-13 LAB — T4, FREE: Free T4: 1.13 ng/dL — ABNORMAL HIGH (ref 0.61–1.12)

## 2023-08-13 LAB — CREATININE, SERUM
Creatinine, Ser: 1.17 mg/dL (ref 0.61–1.24)
GFR, Estimated: 58 mL/min — ABNORMAL LOW (ref >60)

## 2023-08-13 LAB — TSH: TSH: 7.937 u[IU]/mL — ABNORMAL HIGH (ref 0.350–4.500)

## 2023-08-13 LAB — MRSA NEXT GEN BY PCR, NASAL: MRSA by PCR Next Gen: NOT DETECTED

## 2023-08-13 MED ORDER — CHLORHEXIDINE GLUCONATE CLOTH 2 % EX PADS
6.0000 | MEDICATED_PAD | Freq: Every day | CUTANEOUS | Status: DC
  Administered 2023-08-13 – 2023-08-16 (×4): 6 via TOPICAL

## 2023-08-13 MED ORDER — APIXABAN 5 MG PO TABS
5.0000 mg | ORAL_TABLET | Freq: Two times a day (BID) | ORAL | Status: DC
  Administered 2023-08-13 – 2023-08-18 (×11): 5 mg via ORAL
  Filled 2023-08-13 (×11): qty 1

## 2023-08-13 MED ORDER — SODIUM CHLORIDE 0.9 % IV SOLN
1.0000 g | INTRAVENOUS | Status: DC
  Administered 2023-08-13 – 2023-08-16 (×4): 1 g via INTRAVENOUS
  Filled 2023-08-13 (×4): qty 10

## 2023-08-13 MED ORDER — DORZOLAMIDE HCL-TIMOLOL MAL 2-0.5 % OP SOLN
1.0000 [drp] | Freq: Two times a day (BID) | OPHTHALMIC | Status: DC
  Administered 2023-08-13 – 2023-08-18 (×11): 1 [drp] via OPHTHALMIC
  Filled 2023-08-13: qty 10

## 2023-08-13 MED ORDER — SODIUM CHLORIDE 0.9 % IV SOLN: INTRAVENOUS | Status: AC

## 2023-08-13 MED ORDER — FINASTERIDE 5 MG PO TABS
5.0000 mg | ORAL_TABLET | Freq: Every day | ORAL | Status: DC
  Administered 2023-08-13 – 2023-08-18 (×6): 5 mg via ORAL
  Filled 2023-08-13 (×7): qty 1

## 2023-08-13 MED ORDER — SENNOSIDES-DOCUSATE SODIUM 8.6-50 MG PO TABS: 1.0000 | ORAL_TABLET | Freq: Every evening | ORAL | Status: DC | PRN

## 2023-08-13 MED ORDER — ACETAMINOPHEN 325 MG PO TABS: 650.0000 mg | ORAL_TABLET | Freq: Four times a day (QID) | ORAL | Status: DC | PRN

## 2023-08-13 MED ORDER — ORAL CARE MOUTH RINSE: 15.0000 mL | OROMUCOSAL | Status: DC | PRN

## 2023-08-13 MED ORDER — PRAVASTATIN SODIUM 40 MG PO TABS
40.0000 mg | ORAL_TABLET | Freq: Every day | ORAL | Status: DC
  Administered 2023-08-13 – 2023-08-18 (×6): 40 mg via ORAL
  Filled 2023-08-13 (×2): qty 2
  Filled 2023-08-13: qty 1
  Filled 2023-08-13 (×2): qty 2
  Filled 2023-08-13: qty 1

## 2023-08-13 MED ORDER — LATANOPROST 0.005 % OP SOLN
1.0000 [drp] | Freq: Every morning | OPHTHALMIC | Status: DC
  Administered 2023-08-13 – 2023-08-18 (×6): 1 [drp] via OPHTHALMIC
  Filled 2023-08-13: qty 2.5

## 2023-08-13 MED ORDER — FUROSEMIDE 40 MG PO TABS: 40.0000 mg | ORAL_TABLET | Freq: Two times a day (BID) | ORAL | Status: DC

## 2023-08-13 MED ORDER — DOXAZOSIN MESYLATE 4 MG PO TABS
4.0000 mg | ORAL_TABLET | Freq: Every day | ORAL | Status: DC
  Administered 2023-08-14 – 2023-08-18 (×5): 4 mg via ORAL
  Filled 2023-08-13 (×3): qty 1
  Filled 2023-08-13 (×3): qty 4

## 2023-08-13 MED ORDER — HEPARIN SODIUM (PORCINE) 5000 UNIT/ML IJ SOLN: 5000.0000 [IU] | Freq: Three times a day (TID) | INTRAMUSCULAR | Status: DC

## 2023-08-13 MED ORDER — ACETAMINOPHEN 650 MG RE SUPP: 650.0000 mg | Freq: Four times a day (QID) | RECTAL | Status: DC | PRN

## 2023-08-13 NOTE — Evaluation (Signed)
Physical Therapy Evaluation Patient Details Name: Barry Burgess MRN: [id number] DOB: 02-05-1926 Today's Date: 08/13/2023  History of Present Illness  89 year old gentleman with past medical history of BPH, HLD, HTN, PAF admitted with weakness & fatigue, Dx of UTI, AKI.  Clinical Impression  Pt admitted with above diagnosis. Mod assist for supine to sit, pt sat edge of bed for ~8 minutes without loss of balance. He reported mild dizziness in sitting. BP supine 123/71, sitting 109/88. Pt stated he was too weak to attempt standing. At baseline he ambulates independently with a RW and home, his family reports he exercises on a exercises bike and with resistance bands regularly. Pt has had a significant decline in mobility.  Patient will benefit from continued inpatient follow up therapy, <3 hours/day.  Pt currently with functional limitations due to the deficits listed below (see PT Problem List). Pt will benefit from acute skilled PT to increase their independence and safety with mobility to allow discharge.           If plan is discharge home, recommend the following: A lot of help with walking and/or transfers;A lot of help with bathing/dressing/bathroom;Assistance with cooking/housework;Assist for transportation;Help with stairs or ramp for entrance   Can travel by private vehicle   No    Equipment Recommendations None recommended by PT  Recommendations for Other Services       Functional Status Assessment Patient has had a recent decline in their functional status and demonstrates the ability to make significant improvements in function in a reasonable and predictable amount of time.     Precautions / Restrictions Precautions Precautions: Fall Restrictions Weight Bearing Restrictions: No      Mobility  Bed Mobility Overal bed mobility: Needs Assistance Bed Mobility: Supine to Sit, Sit to Supine     Supine to sit: Mod assist, HOB elevated, Used rails Sit to supine: Mod  assist   General bed mobility comments: mod A to advance BLEs; then assist for LEs back into bed; pt sat EOB ~8 minutes, no loss of balance. BP supine 123/71, sitting 109/88, pt reported mild dizziness sitting    Transfers                   General transfer comment: pt refused to attempt transfer, he felt "too weak"    Ambulation/Gait                  Stairs            Wheelchair Mobility     Tilt Bed    Modified Rankin (Stroke Patients Only)       Balance Overall balance assessment: Needs assistance Sitting-balance support: No upper extremity supported, Feet unsupported Sitting balance-Leahy Scale: Fair                                       Pertinent Vitals/Pain Pain Assessment Pain Assessment: No/denies pain    Home Living Family/patient expects to be discharged to:: Private residence Living Arrangements: Children Available Help at Discharge: Family;Available 24 hours/day   Home Access: Ramped entrance         Home Equipment: Rolling Walker (2 wheels);Shower seat;Wheelchair - manual;Toilet riser;Grab bars - tub/shower      Prior Function Prior Level of Function : Needs assist             Mobility Comments: uses RW, no falls in past 6  months ADLs Comments: min assist for for ADLs from son     Extremity/Trunk Assessment   Upper Extremity Assessment Upper Extremity Assessment: Defer to OT evaluation    Lower Extremity Assessment Lower Extremity Assessment: Generalized weakness;RLE deficits/detail;LLE deficits/detail RLE Deficits / Details: edema noted BLEs, knee ext 4/5 RLE Sensation: WNL LLE Deficits / Details: edema noted BLEs, knee ext 4/5 LLE Sensation: WNL    Cervical / Trunk Assessment Cervical / Trunk Assessment: Kyphotic  Communication   Communication Communication: No apparent difficulties  Cognition Arousal: Alert Behavior During Therapy: WFL for tasks assessed/performed Overall Cognitive  Status: Within Functional Limits for tasks assessed                                          General Comments      Exercises General Exercises - Lower Extremity Long Arc Quad: AROM, Both, 10 reps, Seated   Assessment/Plan    PT Assessment Patient needs continued PT services  PT Problem List Decreased activity tolerance;Decreased strength;Decreased balance;Decreased mobility       PT Treatment Interventions Therapeutic activities;Patient/family education;Gait training;Balance training;DME instruction;Therapeutic exercise    PT Goals (Current goals can be found in the Care Plan section)  Acute Rehab PT Goals Patient Stated Goal: to get back to walking PT Goal Formulation: With patient/family Time For Goal Achievement: 08/27/23 Potential to Achieve Goals: Good    Frequency Min 1X/week     Co-evaluation               AM-PAC PT "6 Clicks" Mobility  Outcome Measure Help needed turning from your back to your side while in a flat bed without using bedrails?: A Little Help needed moving from lying on your back to sitting on the side of a flat bed without using bedrails?: A Lot Help needed moving to and from a bed to a chair (including a wheelchair)?: Total Help needed standing up from a chair using your arms (e.g., wheelchair or bedside chair)?: Total Help needed to walk in hospital room?: Total Help needed climbing 3-5 steps with a railing? : Total 6 Click Score: 9    End of Session   Activity Tolerance: Patient tolerated treatment well Patient left: in bed;with bed alarm set;with family/visitor present Nurse Communication: Mobility status PT Visit Diagnosis: Adult, failure to thrive (R62.7);Muscle weakness (generalized) (M62.81);Difficulty in walking, not elsewhere classified (R26.2)    Time: 0865-7846 PT Time Calculation (min) (ACUTE ONLY): 22 min   Charges:   PT Evaluation $PT Eval Moderate Complexity: 1 Mod   PT General Charges $$ ACUTE PT  VISIT: 1 Visit         Tamala Ser PT 08/13/2023  Acute Rehabilitation Services  Office [phone]

## 2023-08-13 NOTE — ED Notes (Signed)
ED TO INPATIENT HANDOFF REPORT  ED Nurse Name and Phone #: Coley Littles  S Name/Age/Gender [name] 89 y.o. male Room/Bed: WA11/WA11  Code Status   Code Status: DNR  Home/SNF/Other Home Patient oriented to: self, place, time, and situation Is this baseline? Yes   Triage Complete: Triage complete  Chief Complaint Hypothermia [T68.XXXA]  Triage Note BIB EMS from home for weakness and hypotension. Recent change in lasix and pt has not been drinking much water due to not wanting to get up to urinate because of weakness. 90/60 on EMS arrival. Last BP 102/60 with EMS.  Hx of afib.   Allergies No Known Allergies  Level of Care/Admitting Diagnosis ED Disposition     ED Disposition  Admit   Condition  --   Comment  Hospital Area: [facility] [[id number]]  Level of Care: Stepdown [14]  Admit to SDU based on following criteria: Hemodynamic compromise or significant risk of instability:  Patient requiring short term acute titration and management of vasoactive drips, and invasive monitoring (i.e., CVP and Arterial line).  May admit patient to [facility] or [facility] if equivalent level of care is available:: Yes  Covid Evaluation: Confirmed COVID Negative  Diagnosis: Hypothermia [557322]  Admitting Physician: Gery Pray [[id number]]  Attending Physician: Gery Pray [[id number]]  Certification:: I certify this patient will need inpatient services for at least 2 midnights  Expected Medical Readiness: 08/15/2023          B Medical/Surgery History Past Medical History:  Diagnosis Date   Aortic insufficiency    Atrial fibrillation with RVR (HCC) 10/14/2015   BPH (benign prostatic hyperplasia)    Glaucoma    Hyperlipidemia    Hypertension    Mitral regurgitation    PAF (paroxysmal atrial fibrillation) (HCC)    Tricuspid regurgitation    Past Surgical History:  Procedure Laterality Date   CARDIOVERSION N/A 11/13/2015   Procedure: CARDIOVERSION;   Surgeon: Wendall Stade, MD;  Location: [facility] ENDOSCOPY;  Service: Cardiovascular;  Laterality: N/A;   EYE SURGERY     bilateral cataracts   PILONIDAL CYST / SINUS EXCISION       A IV Location/Drains/Wounds Patient Lines/Drains/Airways Status     Active Line/Drains/Airways     Name Placement date Placement time Site Days   Peripheral IV 08/12/23 20 G Anterior;Left Forearm 08/12/23  2217  Forearm  1   Peripheral IV 08/12/23 20 G Anterior;Right Forearm 08/12/23  2225  Forearm  1            Intake/Output Last 24 hours  Intake/Output Summary (Last 24 hours) at 08/13/2023 0746 Last data filed at 08/13/2023 0341 Gross per 24 hour  Intake 100 ml  Output --  Net 100 ml    Labs/Imaging Results for orders placed or performed during the hospital encounter of 08/12/23 (from the past 48 hour(s))  Comprehensive metabolic panel     Status: Abnormal   Collection Time: 08/12/23 10:28 PM  Result Value Ref Range   Sodium 134 (L) 135 - 145 mmol/L   Potassium 3.9 3.5 - 5.1 mmol/L   Chloride 96 (L) 98 - 111 mmol/L   CO2 28 22 - 32 mmol/L   Glucose, Bld 134 (H) 70 - 99 mg/dL    Comment: Glucose reference range applies only to samples taken after fasting for at least 8 hours.   BUN 28 (H) 8 - 23 mg/dL   Creatinine, Ser 0.25 (H) 0.61 - 1.24 mg/dL   Calcium 8.8 (L)  8.9 - 10.3 mg/dL   Total Protein 7.1 6.5 - 8.1 g/dL   Albumin 3.8 3.5 - 5.0 g/dL   AST 88 (H) 15 - 41 U/L   ALT 68 (H) 0 - 44 U/L   Alkaline Phosphatase 77 38 - 126 U/L   Total Bilirubin 0.5 0.3 - 1.2 mg/dL   GFR, Estimated 53 (L) >60 mL/min    Comment: (NOTE) Calculated using the CKD-EPI Creatinine Equation (2021)    Anion gap 10 5 - 15    Comment: Performed at [facility], [address] [place], [place] [address]  CBC with Differential     Status: Abnormal   Collection Time: 08/12/23 10:28 PM  Result Value Ref Range   WBC 5.1 4.0 - 10.5 K/uL   RBC 4.03 (L) 4.22 - 5.81 MIL/uL   Hemoglobin 12.1 (L)  13.0 - 17.0 g/dL   HCT 46.9 (L) 62.9 - 52.8 %   MCV 93.5 80.0 - 100.0 fL   MCH 30.0 26.0 - 34.0 pg   MCHC 32.1 30.0 - 36.0 g/dL   RDW 41.3 24.4 - 01.0 %   Platelets 114 (L) 150 - 400 K/uL    Comment: REPEATED TO VERIFY   nRBC 0.0 0.0 - 0.2 %   Neutrophils Relative % 74 %   Neutro Abs 3.8 1.7 - 7.7 K/uL   Lymphocytes Relative 15 %   Lymphs Abs 0.7 0.7 - 4.0 K/uL   Monocytes Relative 8 %   Monocytes Absolute 0.4 0.1 - 1.0 K/uL   Eosinophils Relative 1 %   Eosinophils Absolute 0.0 0.0 - 0.5 K/uL   Basophils Relative 1 %   Basophils Absolute 0.0 0.0 - 0.1 K/uL   Immature Granulocytes 1 %   Abs Immature Granulocytes 0.03 0.00 - 0.07 K/uL    Comment: Performed at [facility], [address] [place], [place] [address]  Protime-INR     Status: Abnormal   Collection Time: 08/12/23 10:28 PM  Result Value Ref Range   Prothrombin Time 17.8 (H) 11.4 - 15.2 seconds   INR 1.4 (H) 0.8 - 1.2    Comment: (NOTE) INR goal varies based on device and disease states. Performed at [facility], [address], [place], [place] [address]   APTT     Status: Abnormal   Collection Time: 08/12/23 10:28 PM  Result Value Ref Range   aPTT 40 (H) 24 - 36 seconds    Comment:        IF BASELINE aPTT IS ELEVATED, SUGGEST PATIENT RISK ASSESSMENT BE USED TO DETERMINE APPROPRIATE ANTICOAGULANT THERAPY. Performed at [facility], [address] [place], [place] [address]   TSH     Status: Abnormal   Collection Time: 08/12/23 10:28 PM  Result Value Ref Range   TSH 7.937 (H) 0.350 - 4.500 uIU/mL    Comment: Performed by a 3rd Generation assay with a functional sensitivity of <=0.01 uIU/mL. Performed at [facility], [address] [place], [place] [address]   T4, free     Status: Abnormal   Collection Time: 08/12/23 10:28 PM  Result Value Ref Range   Free T4 1.13 (H) 0.61 - 1.12 ng/dL    Comment: (NOTE) Biotin ingestion may  interfere with free T4 tests. If the results are inconsistent with the TSH level, previous test results, or the clinical presentation, then consider biotin interference. If needed, order repeat testing after stopping biotin. Performed at [facility], [address] [place], [place] [address]   Brain natriuretic peptide     Status: Abnormal   Collection Time: 08/12/23 10:28 PM  Result Value Ref Range   B Natriuretic Peptide 217.0 (H) 0.0 - 100.0 pg/mL    Comment: Performed at [facility], [address], [place], [place] [address]  Ammonia     Status: None   Collection Time: 08/12/23 10:59 PM  Result Value Ref Range   Ammonia 26 9 - 35 umol/L    Comment: Performed at [facility], [address] [place], [place] [address]  Urinalysis, w/ Reflex to Culture (Infection Suspected) -Urine, Clean Catch     Status: Abnormal   Collection Time: 08/13/23 12:24 AM  Result Value Ref Range   Specimen Source URINE, CLEAN CATCH    Color, Urine YELLOW YELLOW   APPearance CLEAR CLEAR   Specific Gravity, Urine 1.015 1.005 - 1.030   pH 6.0 5.0 - 8.0   Glucose, UA NEGATIVE NEGATIVE mg/dL   Hgb urine dipstick SMALL (A) NEGATIVE   Bilirubin Urine NEGATIVE NEGATIVE   Ketones, ur NEGATIVE NEGATIVE mg/dL   Protein, ur 30 (A) NEGATIVE mg/dL   Nitrite POSITIVE (A) NEGATIVE   Leukocytes,Ua TRACE (A) NEGATIVE   Squamous Epithelial / HPF 0-5 0 - 5 /HPF   WBC, UA 0-5 0 - 5 WBC/hpf    Comment: Reflex urine culture not performed if WBC <=10, OR if Squamous epithelial cells >5. If Squamous epithelial cells >5, suggest recollection.   RBC / HPF 0-5 0 - 5 RBC/hpf   Bacteria, UA FEW (A) NONE SEEN   Hyaline Casts, UA PRESENT     Comment: Performed at [facility], [address] [place], [place] [address]  Lactic acid, plasma     Status: None   Collection Time: 08/13/23 12:36 AM  Result Value Ref Range   Lactic Acid, Venous 1.0 0.5 - 1.9 mmol/L     Comment: Performed at [facility], [address], [place], [place] [address]  CBC     Status: Abnormal   Collection Time: 08/13/23  4:32 AM  Result Value Ref Range   WBC 4.7 4.0 - 10.5 K/uL   RBC 3.45 (L) 4.22 - 5.81 MIL/uL   Hemoglobin 10.7 (L) 13.0 - 17.0 g/dL   HCT 52.8 (L) 41.3 - 24.4 %   MCV 93.0 80.0 - 100.0 fL   MCH 31.0 26.0 - 34.0 pg   MCHC 33.3 30.0 - 36.0 g/dL   RDW 01.0 27.2 - 53.6 %   Platelets 107 (L) 150 - 400 K/uL    Comment: REPEATED TO VERIFY   nRBC 0.0 0.0 - 0.2 %    Comment: Performed at [facility], [address] [place], [place] [address]  Creatinine, serum     Status: Abnormal   Collection Time: 08/13/23  4:32 AM  Result Value Ref Range   Creatinine, Ser 1.17 0.61 - 1.24 mg/dL   GFR, Estimated 58 (L) >60 mL/min    Comment: (NOTE) Calculated using the CKD-EPI Creatinine Equation (2021) Performed at [facility], [address], [place], [place] [address]   Differential     Status: Abnormal   Collection Time: 08/13/23  4:32 AM  Result Value Ref Range   Neutrophils Relative % 78 %   Neutro Abs 3.5 1.7 - 7.7 K/uL   Lymphocytes Relative 14 %   Lymphs Abs 0.6 (L) 0.7 - 4.0 K/uL  Monocytes Relative 8 %   Monocytes Absolute 0.4 0.1 - 1.0 K/uL   Eosinophils Relative 0 %   Eosinophils Absolute 0.0 0.0 - 0.5 K/uL   Basophils Relative 0 %   Basophils Absolute 0.0 0.0 - 0.1 K/uL   Immature Granulocytes 0 %   Abs Immature Granulocytes 0.02 0.00 - 0.07 K/uL    Comment: Performed at [facility], [address] [place], [place] [address]  Comprehensive metabolic panel     Status: Abnormal   Collection Time: 08/13/23  5:38 AM  Result Value Ref Range   Sodium 136 135 - 145 mmol/L   Potassium 3.9 3.5 - 5.1 mmol/L   Chloride 99 98 - 111 mmol/L   CO2 28 22 - 32 mmol/L   Glucose, Bld 127 (H) 70 - 99 mg/dL    Comment: Glucose reference range applies only to samples taken after  fasting for at least 8 hours.   BUN 26 (H) 8 - 23 mg/dL   Creatinine, Ser 6.21 0.61 - 1.24 mg/dL   Calcium 8.6 (L) 8.9 - 10.3 mg/dL   Total Protein 6.0 (L) 6.5 - 8.1 g/dL   Albumin 3.2 (L) 3.5 - 5.0 g/dL   AST 66 (H) 15 - 41 U/L   ALT 57 (H) 0 - 44 U/L   Alkaline Phosphatase 62 38 - 126 U/L   Total Bilirubin 0.5 0.3 - 1.2 mg/dL   GFR, Estimated 59 (L) >60 mL/min    Comment: (NOTE) Calculated using the CKD-EPI Creatinine Equation (2021)    Anion gap 9 5 - 15    Comment: Performed at [facility], [address], [place], [place] [address]   CT Head Wo Contrast  Result Date: 08/12/2023 CLINICAL DATA:  Mental status change, unknown cause EXAM: CT HEAD WITHOUT CONTRAST TECHNIQUE: Contiguous axial images were obtained from the base of the skull through the vertex without intravenous contrast. RADIATION DOSE REDUCTION: This exam was performed according to the departmental dose-optimization program which includes automated exposure control, adjustment of the mA and/or kV according to patient size and/or use of iterative reconstruction technique. COMPARISON:  None Available. FINDINGS: Brain: Patchy chronic small vessel disease throughout the deep white matter. No acute intracranial abnormality. Specifically, no hemorrhage, hydrocephalus, mass lesion, acute infarction, or significant intracranial injury. Vascular: No hyperdense vessel or unexpected calcification. Skull: No acute calvarial abnormality. Sinuses/Orbits: No acute findings Other: None IMPRESSION: No acute intracranial abnormality. Patchy chronic small vessel disease. Electronically Signed   By: Charlett Nose M.D.   On: 08/12/2023 23:33   DG Chest Port 1 View  Result Date: 08/12/2023 CLINICAL DATA:  Questionable sepsis EXAM: PORTABLE CHEST 1 VIEW COMPARISON:  Chest x-ray 05/11/2022 FINDINGS: Large hiatal hernia is again seen. The heart is enlarged, unchanged. There are small bilateral pleural effusions, left greater than  right. There some infiltrates in the left lung base. Pneumothorax or acute fracture. IMPRESSION: 1. Cardiomegaly. 2. Small bilateral pleural effusions, left greater than right. 3. Infiltrate in the left lung base. 4. Large hiatal hernia. Electronically Signed   By: Darliss Cheney M.D.   On: 08/12/2023 23:28    Pending Labs Unresulted Labs (From admission, onward)     Start     Ordered   08/13/23 0500  CBC with Differential/Platelet  Tomorrow morning,   R        08/13/23 0354   08/13/23 0218  Urinalysis, w/ Reflex to Culture (Infection Suspected) -Urine, Clean Catch  (Urine Labs)  Once,  R       Question:  Specimen Source  Answer:  Urine, Clean Catch   08/13/23 0218   08/13/23 0128  Urine Culture  Add-on,   AD       Question:  Indication  Answer:  Sepsis   08/13/23 0128   08/12/23 2247  T3, free  Once,   URGENT        08/12/23 2246   08/12/23 2246  Blood Culture (routine x 2)  (Undifferentiated presentation (screening labs and basic nursing orders))  BLOOD CULTURE X 2,   STAT      08/12/23 2246            Vitals/Pain Today's Vitals   08/13/23 0232 08/13/23 0234 08/13/23 0400 08/13/23 0700  BP: (!) 102/52  (!) 92/57 (!) 94/53  Pulse: (!) 45 61 69 69  Resp: 15 18 15 16   Temp:   97.9 F (36.6 C)   TempSrc:   Oral   SpO2: 96% 97% 94% 96%  Weight:      Height:      PainSc:        Isolation Precautions No active isolations  Medications Medications  cefTRIAXone (ROCEPHIN) 1 g in sodium chloride 0.9 % 100 mL IVPB (0 g Intravenous Stopped 08/13/23 0341)  finasteride (PROSCAR) tablet 5 mg (has no administration in time range)  apixaban (ELIQUIS) tablet 5 mg (has no administration in time range)  doxazosin (CARDURA) tablet 4 mg (has no administration in time range)  pravastatin (PRAVACHOL) tablet 40 mg (has no administration in time range)  latanoprost (XALATAN) 0.005 % ophthalmic solution 1 drop (has no administration in time range)  dorzolamide-timolol (COSOPT) 2-0.5 %  ophthalmic solution 1 drop (has no administration in time range)  acetaminophen (TYLENOL) tablet 650 mg (has no administration in time range)    Or  acetaminophen (TYLENOL) suppository 650 mg (has no administration in time range)  senna-docusate (Senokot-S) tablet 1 tablet (has no administration in time range)  0.9 %  sodium chloride infusion ( Intravenous New Bag/Given 08/13/23 0428)    Mobility walks     Focused Assessments    R Recommendations: See Admitting Provider Note  Report given to:   Additional Notes:

## 2023-08-13 NOTE — H&P (Addendum)
PCP:   [name], MD   Chief Complaint:  Weakness  HPI: This is a pleasant 89 year old gentleman with past medical history of BPH, HLD, HTN, PAF.  He lives at home with his son and daughter-in-law.  The patient exercises 4 times weekly.  Approximately a month ago patient was a bit dehydrated, his Lasix was decreased to daily with directions to increase if fluid retention was noted.  Patient's weight increased by 5 pounds, Lasix restarted Sunday.  Prior to this his liquid p.o. intake was decreasing.  One week ago, the family noted that his walking was twice a slow and more shuffled.  He walked with small steps.  He was fatigued, tired and occasionally altered.  There is no report of nausea, vomiting, fevers or chills just loss of weakness.  The family believes the patient wa patient was dehydrated, brought him to the ER.  In the ER presenting temperature 92.2.  104/75, 59.  Creatin platelets 114, INR 1.4, TSH 7.937, creatinine 1.26, previously normal.  AST 88, ALT 68 previously normal.  UA consistent with UTI.  Urine and blood cultures collected in ER.Marland Kitchen  Bear hugger started  Review of Systems:  Per HPI.  Past Medical History: Past Medical History:  Diagnosis Date   Aortic insufficiency    Atrial fibrillation with RVR (HCC) 10/14/2015   BPH (benign prostatic hyperplasia)    Glaucoma    Hyperlipidemia    Hypertension    Mitral regurgitation    PAF (paroxysmal atrial fibrillation) (HCC)    Tricuspid regurgitation    Past Surgical History:  Procedure Laterality Date   CARDIOVERSION N/A 11/13/2015   Procedure: CARDIOVERSION;  Surgeon: Wendall Stade, MD;  Location: [facility] ENDOSCOPY;  Service: Cardiovascular;  Laterality: N/A;   EYE SURGERY     bilateral cataracts   PILONIDAL CYST / SINUS EXCISION      Medications: Prior to Admission medications   Medication Sig Start Date End Date Taking? Authorizing Provider  dorzolamide-timolol (COSOPT) 22.3-6.8 MG/ML ophthalmic solution Place 1 drop  into both eyes 2 (two) times daily. 10/07/15  Yes [provider]  doxazosin (CARDURA) 4 MG tablet Take 4 mg by mouth daily. 10/11/15  Yes [provider]  ELIQUIS 5 MG TABS tablet TAKE 1 TABLET BY MOUTH TWICE  DAILY 04/26/23  Yes Wendall Stade, MD  finasteride (PROSCAR) 5 MG tablet Take 5 mg by mouth daily. 08/14/15  Yes [provider]  furosemide (LASIX) 40 MG tablet Take 1 tablet (40 mg total) by mouth 2 (two) times daily. 08/04/23  Yes Wendall Stade, MD  latanoprost (XALATAN) 0.005 % ophthalmic solution Place 1 drop into both eyes every morning. 10/07/15  Yes [provider]  pravastatin (PRAVACHOL) 80 MG tablet Take 40 mg by mouth daily.  11/25/15  Yes [provider]  amoxicillin (AMOXIL) 500 MG tablet Take 500 mg by mouth 3 (three) times daily. Patient not taking: Reported on 08/12/2023 06/09/23   [provider]    Allergies:  No Known Allergies  Social History:  reports that he quit smoking about 60 years ago. His smoking use included pipe and cigars. He has never used smokeless tobacco. He reports that he does not currently use alcohol. He reports that he does not use drugs.  Family History: Family History  Problem Relation Age of Onset   Stroke Father 50    Physical Exam: Vitals:   08/12/23 2240 08/12/23 2245 08/12/23 2300 08/12/23 2345  BP: 99/66 102/66 105/82  Pulse: (!) 59 (!) 45 (!) 50 (!) 52  Resp: 16 16 15 12   Temp:      TempSrc:      SpO2: 95% 96% 98% 95%  Weight:      Height:        General: Alert and oriented x 3, some cachexia, no acute distress Eyes: Pink conjunctiva, no scleral icterus ENT: Mildly dry oral mucosa, neck supple, no thyromegaly Lungs: CTA space B/L, no wheeze, no crackles, no use of accessory muscles Cardiovascular: Bradycardia, RRR, no regurgitation, no gallops, no murmurs. No carotid bruits, no JVD Abdomen: soft, positive BS, NTND, no organomegaly, not an acute abdomen GU: not  examined Neuro: CN II - XII grossly intact, sensation intact Musculoskeletal: strength 5/5 all extremities.  Positive pitting edema Skin: Wounds right lower extremity, does not appear infected Psych: appropriate patient   Labs on Admission:  Recent Labs    08/12/23 2228  NA 134*  K 3.9  CL 96*  CO2 28  GLUCOSE 134*  BUN 28*  CREATININE 1.26*  CALCIUM 8.8*   Recent Labs    08/12/23 2228  AST 88*  ALT 68*  ALKPHOS 77  BILITOT 0.5  PROT 7.1  ALBUMIN 3.8    Recent Labs    08/12/23 2228  WBC 5.1  NEUTROABS 3.8  HGB 12.1*  HCT 37.7*  MCV 93.5  PLT 114*    Recent Labs    08/12/23 2228  TSH 7.937*     Radiological Exams on Admission: CT Head Wo Contrast  Result Date: 08/12/2023 CLINICAL DATA:  Mental status change, unknown cause EXAM: CT HEAD WITHOUT CONTRAST TECHNIQUE: Contiguous axial images were obtained from the base of the skull through the vertex without intravenous contrast. RADIATION DOSE REDUCTION: This exam was performed according to the departmental dose-optimization program which includes automated exposure control, adjustment of the mA and/or kV according to patient size and/or use of iterative reconstruction technique. COMPARISON:  None Available. FINDINGS: Brain: Patchy chronic small vessel disease throughout the deep white matter. No acute intracranial abnormality. Specifically, no hemorrhage, hydrocephalus, mass lesion, acute infarction, or significant intracranial injury. Vascular: No hyperdense vessel or unexpected calcification. Skull: No acute calvarial abnormality. Sinuses/Orbits: No acute findings Other: None IMPRESSION: No acute intracranial abnormality. Patchy chronic small vessel disease. Electronically Signed   By: Charlett Nose M.D.   On: 08/12/2023 23:33   DG Chest Port 1 View  Result Date: 08/12/2023 CLINICAL DATA:  Questionable sepsis EXAM: PORTABLE CHEST 1 VIEW COMPARISON:  Chest x-ray 05/11/2022 FINDINGS: Large hiatal hernia is again  seen. The heart is enlarged, unchanged. There are small bilateral pleural effusions, left greater than right. There some infiltrates in the left lung base. Pneumothorax or acute fracture. IMPRESSION: 1. Cardiomegaly. 2. Small bilateral pleural effusions, left greater than right. 3. Infiltrate in the left lung base. 4. Large hiatal hernia. Electronically Signed   By: Darliss Cheney M.D.   On: 08/12/2023 23:28    Assessment/Plan Present on Admission:  Acute UTI //  Thrombocytopeniasevere sepsis -Urine and blood cultures collected -IV Rocephin on board   AKI (acute kidney injury) (HCC) -Limited, gentle IV fluid hydration -for 5 hours.  BMP in a.m.   Acute metabolic encephalopathy //weakness -Due to above, correcting with IV fluid hydration antibiotics -PT/OT consult placed   Elevated LFTs //  Thrombocytopenia -Mild, new.  Like related to sepsis/infection. -Monitor, CMP in a.m.   BPH -Finasteride resumed   HLD (hyperlipidemia) -Pravastatin resumed   Fluid retention -Lasix on hold  Atrial fibrillation -On Eliquis, continued.  EKG ordered -No rate control as patient with slow atrial fibrillation.  Kawena Lyday 08/13/2023, 1:35 AM

## 2023-08-13 NOTE — Care Plan (Signed)
This 89 years old male with PMH significant for BPH, hyperlipidemia, hypertension, paroxysmal A-fib was brought in by family with generalized weakness,  fatigue and altered mental status.  Family reports patient was dehydrated a month ago so his Lasix dose was decreased to daily. Now patient's weight has increased by 5 pounds and Lasix was restarted.  Family has noticed generalized slowing.  Family was concerned about dehydration so they brought him here.  Patient was hypothermic with a temp of 92.2,  bradycardic with heart rate of 59 and hypotensive with  blood pressure of 104/75.  Blood and urine cultures were collected. Patient was placed on Bair hugger's.  Lab work consistent with UTI started on IV Rocephin.  Vitals has improved.  Patient was seen and examined at bedside.

## 2023-08-14 DIAGNOSIS — T68XXXA Hypothermia, initial encounter: Secondary | ICD-10-CM | POA: Diagnosis not present

## 2023-08-14 LAB — URINE CULTURE

## 2023-08-14 LAB — T3, FREE: T3, Free: 2.3 pg/mL (ref 2.0–4.4)

## 2023-08-14 MED ORDER — SODIUM CHLORIDE 0.9 % IV SOLN
100.0000 mg | Freq: Two times a day (BID) | INTRAVENOUS | Status: DC
  Administered 2023-08-14 – 2023-08-16 (×5): 100 mg via INTRAVENOUS
  Filled 2023-08-14 (×5): qty 100

## 2023-08-14 NOTE — Evaluation (Signed)
Occupational Therapy Evaluation Patient Details Name: Barry Burgess MRN: [id number] DOB: 1926-09-02 Today's Date: 08/14/2023   History of Present Illness 89 year old gentleman admitted with weakness, fatigue & AMS. Dx of UTI, AKI. Past medical history of BPH, HLD, HTN, PAF   Clinical Impression   PTA pt lives with his son, who assists as needed per pt. Pt states he is independent with self care and mobility. Per pt's other son who was visiting today, his brother has health issues of his own and is not always able to help his father. Currently requires min A with mobility and mod to max A with LB ADL @ RW level due to below listed deficits. Pt initially agreeable to post acute rehab then began to question why he needed rehab. At this time feel Patient will benefit from continued inpatient follow up therapy, <3 hours/day. Acute OT will follow. VSS RA.        If plan is discharge home, recommend the following: A lot of help with walking and/or transfers;A lot of help with bathing/dressing/bathroom;Assistance with cooking/housework;Assist for transportation;Help with stairs or ramp for entrance    Functional Status Assessment  Patient has had a recent decline in their functional status and demonstrates the ability to make significant improvements in function in a reasonable and predictable amount of time.  Equipment Recommendations  None recommended by OT    Recommendations for Other Services       Precautions / Restrictions Precautions Precautions: Fall Restrictions Weight Bearing Restrictions: No      Mobility Bed Mobility               General bed mobility comments: OOB in chair    Transfers Overall transfer level: Needs assistance Equipment used: Rolling walker (2 wheels) Transfers: Sit to/from Stand, Bed to chair/wheelchair/BSC Sit to Stand: Min assist     Step pivot transfers: Min assist     General transfer comment: unsteady and off balance from chair       Balance Overall balance assessment: Needs assistance Sitting-balance support: No upper extremity supported, Feet unsupported Sitting balance-Leahy Scale: Good     Standing balance support: Bilateral upper extremity supported, During functional activity Standing balance-Leahy Scale: Poor; required physical assist; easily fatigues "My balance is off"                             ADL either performed or assessed with clinical judgement   ADL Overall ADL's : Needs assistance/impaired Eating/Feeding: Set up   Grooming: Set up;Sitting   Upper Body Bathing: Set up;Sitting;Supervision/ safety   Lower Body Bathing: Sit to/from stand;Moderate assistance   Upper Body Dressing : Minimal assistance;Sitting   Lower Body Dressing: Maximal assistance;Sit to/from stand   Toilet Transfer: Stand-pivot;Minimal assistance (imulated)           Functional mobility during ADLs: Minimal assistance;Rolling walker (2 wheels);Cueing for safety       Vision Baseline Vision/History: 1 Wears glasses Vision Assessment?: Wears glasses for reading;Wears glasses for driving (strabismus r eye)     Perception         Praxis         Pertinent Vitals/Pain Pain Assessment Pain Assessment: No/denies pain     Extremity/Trunk Assessment Upper Extremity Assessment Upper Extremity Assessment: Generalized weakness but functional   Lower Extremity Assessment Lower Extremity Assessment: Defer to PT evaluation RLE Deficits / Details: edema noted BLEs, knee ext 4/5 RLE Sensation: WNL LLE Deficits / Details:  edema noted BLEs, knee ext 4/5 LLE Sensation: WNL   Cervical / Trunk Assessment Cervical / Trunk Assessment: Kyphotic   Communication Communication Communication: No apparent difficulties;Hearing impairment   Cognition Arousal: Alert Behavior During Therapy: WFL for tasks assessed/performed Overall Cognitive Status:  (At times repeats himself and is agreeable to rehab then becomes  less agreeable to rehab; decreased safety awareness/reasoning; decreased on-line awareness) Area of Impairment: Awareness, Safety/judgement                                     General Comments       Exercises Exercises: General Upper Extremity, General Lower Extremity General Exercises - Upper Extremity Shoulder Flexion: Strengthening, Both, 10 reps, Seated, Theraband Theraband Level (Shoulder Flexion): Level 1 (Yellow) Shoulder Extension: Both, 10 reps, Seated, Strengthening, Theraband Theraband Level (Shoulder Extension): Level 1 (Yellow) Elbow Flexion: Strengthening, Both, 10 reps, Seated, Theraband Theraband Level (Elbow Flexion): Level 1 (Yellow) Elbow Extension: Strengthening, Both, 10 reps, Seated, Theraband Theraband Level (Elbow Extension): Level 1 (Yellow) General Exercises - Lower Extremity Short Arc Quad: Both, 15 reps, Seated Hip Flexion/Marching: 15 reps, Both   Shoulder Instructions      Home Living Family/patient expects to be discharged to:: Private residence Living Arrangements: Children Available Help at Discharge: Family;Available 24 hours/day   Home Access: Ramped entrance     Home Layout: One level     Bathroom Shower/Tub: Producer, television/film/video: Standard     Home Equipment: Agricultural consultant (2 wheels);Shower seat;Wheelchair - manual;Toilet riser;Grab bars - tub/shower          Prior Functioning/Environment Prior Level of Function : Needs assist             Mobility Comments: uses RW, no falls in past 6 months ADLs Comments: min assist for for ADLs from son        OT Problem List: Decreased strength;Decreased activity tolerance;Impaired balance (sitting and/or standing);Decreased safety awareness      OT Treatment/Interventions: Self-care/ADL training;Therapeutic exercise;DME and/or AE instruction;Therapeutic activities;Patient/family education;Balance training    OT Goals(Current goals can be found in the  care plan section) Acute Rehab OT Goals Patient Stated Goal: to get better balance OT Goal Formulation: With patient Time For Goal Achievement: 08/28/23 Potential to Achieve Goals: Good  OT Frequency: Min 1X/week    Co-evaluation              AM-PAC OT "6 Clicks" Daily Activity     Outcome Measure Help from another person eating meals?: A Little Help from another person taking care of personal grooming?: A Little Help from another person toileting, which includes using toliet, bedpan, or urinal?: A Lot Help from another person bathing (including washing, rinsing, drying)?: A Lot Help from another person to put on and taking off regular upper body clothing?: A Little Help from another person to put on and taking off regular lower body clothing?: A Lot 6 Click Score: 15   End of Session Equipment Utilized During Treatment: Gait belt;Rolling walker (2 wheels) Nurse Communication: Mobility status  Activity Tolerance: Patient tolerated treatment well Patient left: in chair;with call bell/phone within reach;with chair alarm set;with family/visitor present  OT Visit Diagnosis: Unsteadiness on feet (R26.81);Other abnormalities of gait and mobility (R26.89);Muscle weakness (generalized) (M62.81)                Time: 1520-1550 OT Time Calculation (min): 30 min Charges:  OT  General Charges $OT Visit: 1 Visit OT Evaluation $OT Eval Moderate Complexity: 1 Mod OT Treatments $Therapeutic Exercise: 8-22 mins  Luisa Dago, OT/L   Acute OT Clinical Specialist Acute Rehabilitation Services Pager [phone] Office [phone]   [facility] 08/14/2023, 4:24 PM

## 2023-08-14 NOTE — Progress Notes (Signed)
Physical Therapy Treatment Patient Details Name: Barry Burgess MRN: [id number] DOB: [birth year] Today's Date: 08/14/2023   History of Present Illness 89 year old gentleman with past medical history of BPH, HLD, HTN, PAF admitted with weakness & fatigue, Dx of UTI, AKI.    PT Comments  Pt reports feeling better today compared to yesterday. Decreased assistance required for mobility today. Pt was able to transfer bed to recliner with a RW, no loss of balance. Patient will benefit from continued inpatient follow up therapy, <3 hours/day. Per family, pt's son with whom pt lives, often has health issues that would keep him from providing 77* care for pt at home.      If plan is discharge home, recommend the following: A lot of help with walking and/or transfers;A lot of help with bathing/dressing/bathroom;Assistance with cooking/housework;Assist for transportation;Help with stairs or ramp for entrance   Can travel by private vehicle     Yes  Equipment Recommendations  None recommended by PT    Recommendations for Other Services       Precautions / Restrictions Precautions Precautions: Fall Restrictions Weight Bearing Restrictions: No     Mobility  Bed Mobility Overal bed mobility: Needs Assistance       Supine to sit: Used rails, HOB elevated, Min assist     General bed mobility comments: Min A to raise trunk    Transfers Overall transfer level: Needs assistance Equipment used: Rolling walker (2 wheels) Transfers: Sit to/from Stand, Bed to chair/wheelchair/BSC Sit to Stand: Contact guard assist   Step pivot transfers: Contact guard assist            Ambulation/Gait                   Stairs             Wheelchair Mobility     Tilt Bed    Modified Rankin (Stroke Patients Only)       Balance Overall balance assessment: Needs assistance Sitting-balance support: No upper extremity supported, Feet unsupported Sitting balance-Leahy Scale:  Good     Standing balance support: Bilateral upper extremity supported, During functional activity Standing balance-Leahy Scale: Fair                              Cognition Arousal: Alert Behavior During Therapy: WFL for tasks assessed/performed Overall Cognitive Status: Within Functional Limits for tasks assessed                                          Exercises      General Comments        Pertinent Vitals/Pain Pain Assessment Pain Assessment: No/denies pain    Home Living                          Prior Function            PT Goals (current goals can now be found in the care plan section) Acute Rehab PT Goals Patient Stated Goal: to get back to walking PT Goal Formulation: With patient/family Time For Goal Achievement: 08/27/23 Potential to Achieve Goals: Good Progress towards PT goals: Progressing toward goals    Frequency    Min 1X/week      PT Plan      Co-evaluation  AM-PAC PT "6 Clicks" Mobility   Outcome Measure  Help needed turning from your back to your side while in a flat bed without using bedrails?: A Little Help needed moving from lying on your back to sitting on the side of a flat bed without using bedrails?: A Little Help needed moving to and from a bed to a chair (including a wheelchair)?: A Little Help needed standing up from a chair using your arms (e.g., wheelchair or bedside chair)?: A Little Help needed to walk in hospital room?: A Lot Help needed climbing 3-5 steps with a railing? : Total 6 Click Score: 15    End of Session Equipment Utilized During Treatment: Gait belt Activity Tolerance: Patient tolerated treatment well Patient left: with family/visitor present;in chair;with call bell/phone within reach;with chair alarm set Nurse Communication: Mobility status PT Visit Diagnosis: Adult, failure to thrive (R62.7);Muscle weakness (generalized) (M62.81);Difficulty in  walking, not elsewhere classified (R26.2)     Time: 7829-5621 PT Time Calculation (min) (ACUTE ONLY): 23 min  Charges:    $Therapeutic Activity: 23-37 mins PT General Charges $$ ACUTE PT VISIT: 1 Visit                     Tamala Ser PT 08/14/2023  Acute Rehabilitation Services  Office [phone]

## 2023-08-14 NOTE — Progress Notes (Signed)
PROGRESS NOTE    Barry Burgess  ZOX:[id number] DOB: 1926/05/11 DOA: 08/12/2023 PCP: [name], MD   Brief Narrative: This 89 years old male with PMH significant for BPH, hyperlipidemia, hypertension, paroxysmal A-fib was brought in by family with generalized weakness,  fatigue and altered mental status.  Family reports patient was dehydrated a month ago so his Lasix dose was decreased to daily. Now patient's weight has increased by 5 pounds and Lasix was restarted.  Family has noticed generalized slowing.  Family was concerned about dehydration so they brought him here.  Patient was hypothermic with a temp of 92.2,  bradycardic with heart rate of 59 and hypotensive with  blood pressure of 104/75.  Blood and urine cultures were collected. Patient was placed on Bair hugger's.  Lab work consistent with UTI, started on IV Rocephin.  Vitals has improved.   Assessment & Plan:   Principal Problem:   Hypothermia Active Problems:   HTN (hypertension)   HLD (hyperlipidemia)   Paroxysmal atrial fibrillation (HCC)   AKI (acute kidney injury) (HCC)   Acute metabolic encephalopathy   Sepsis secondary to UTI (HCC)  Acute metabolic encephalopathy: Likely multifactorial, dehydration, UTI, electrolyte abnormalities. CT head no acute intracranial abnormality. UA consistent with UTI. CXR : Left lobe infiltrate. Continue IV ceftriaxone  and doxycycline , follow-up blood and urine cultures. Mental status improved and back to baseline.  Acute hypoxic respiratory failure: Could be secondary to pneumonia. Continue ceftriaxone and doxycyline. Continue supplemental oxygen and wean as tolerated.  Hypothermia: Sepsis: Patient presented with hypothermia, bradycardia, hypotension, lactic acid 1.0, hypoxia requiring 2 L supplemental oxygen and source of infection. Patient placed on Bair hugger's, temperature improved. Empirically started on ceftriaxone , doxycycline for pneumonia. Sepsis present on  admission now improved.  Acute kidney injury: > Resolved. Likely due to dehydration, resolved with IV hydration.. Renal functions improved and back to normal.  Urinary tract infection: Continue ceftriaxone, follow-up blood and urine cultures.  BPH: Continue finasteride.  Hyperlipidemia: Continue pravastatin  Atrial fibrillation: Heart rate is well-controlled.  Continue Eliquis. Patient is on no rate controlling medications.  Elevated liver enzymes: Could be reactive.  Continue to monitor LFTs  Thrombocytopenia: Continue to monitor platelet count.  Chronic LE swelling: Patient has chronic lower extremity changes with mild erythema. Patient already on antibiotics.  DVT prophylaxis: Eliquis Code Status: DNR Family Communication: No family at bedside Disposition Plan:  Status is: Inpatient Remains inpatient appropriate because: Admitted for confusion, hypotension, bradycardia, hypothermia, found to have UTI, PNA started on IV antibiotics. Needs PT and OT evaluation for disposition  Consultants:  None  Procedures: None Antimicrobials: Ceftriaxone  Subjective: Patient was seen and examined at bedside.  Overnight events noted. Patient seems much improved.  He is alert and oriented following commands.  Objective: Vitals:   08/14/23 0400 08/14/23 0600 08/14/23 0800 08/14/23 0830  BP: (!) 104/47 (!) 104/51 130/65   Pulse: (!) 50 (!) 59 63   Resp: 12 11 14    Temp:    (!) 97.5 F (36.4 C)  TempSrc:    Oral  SpO2: 96% 97% 91%   Weight:      Height:        Intake/Output Summary (Last 24 hours) at 08/14/2023 1055 Last data filed at 08/14/2023 0500 Gross per 24 hour  Intake 1438.65 ml  Output 1000 ml  Net 438.65 ml   Filed Weights   08/12/23 2223 08/13/23 0935  Weight: 87.5 kg 81.7 kg    Examination:  General exam:  Appears calm and comfortable, deconditioned, not in any distress. Respiratory system: Clear to auscultation. Respiratory effort normal.  RR  16 Cardiovascular system: S1 & S2 heard, irregular rhythm.  No pedal edema. Gastrointestinal system: Abdomen is nondistended, soft and nontender.  Normal bowel sounds heard. Central nervous system: Alert and oriented. No focal neurological deficits. Extremities: Chronic lower extremity degenerative changes.  Edema+ Skin: No rashes, lesions or ulcers Psychiatry: Judgement and insight appear normal. Mood & affect appropriate.     Data Reviewed: I have personally reviewed following labs and imaging studies  CBC: Recent Labs  Lab 08/12/23 2228 08/13/23 0432  WBC 5.1 4.7  NEUTROABS 3.8 3.5  HGB 12.1* 10.7*  HCT 37.7* 32.1*  MCV 93.5 93.0  PLT 114* 107*   Basic Metabolic Panel: Recent Labs  Lab 08/12/23 2228 08/13/23 0432 08/13/23 0538  NA 134*  --  136  K 3.9  --  3.9  CL 96*  --  99  CO2 28  --  28  GLUCOSE 134*  --  127*  BUN 28*  --  26*  CREATININE 1.26* 1.17 1.15  CALCIUM 8.8*  --  8.6*   GFR: Estimated Creatinine Clearance: 43.1 mL/min (by C-G formula based on SCr of 1.15 mg/dL). Liver Function Tests: Recent Labs  Lab 08/12/23 2228 08/13/23 0538  AST 88* 66*  ALT 68* 57*  ALKPHOS 77 62  BILITOT 0.5 0.5  PROT 7.1 6.0*  ALBUMIN 3.8 3.2*   No results for input(s): "LIPASE", "AMYLASE" in the last 168 hours. Recent Labs  Lab 08/12/23 2259  AMMONIA 26   Coagulation Profile: Recent Labs  Lab 08/12/23 2228  INR 1.4*   Cardiac Enzymes: No results for input(s): "CKTOTAL", "CKMB", "CKMBINDEX", "TROPONINI" in the last 168 hours. BNP (last 3 results) No results for input(s): "PROBNP" in the last 8760 hours. HbA1C: No results for input(s): "HGBA1C" in the last 72 hours. CBG: No results for input(s): "GLUCAP" in the last 168 hours. Lipid Profile: No results for input(s): "CHOL", "HDL", "LDLCALC", "TRIG", "CHOLHDL", "LDLDIRECT" in the last 72 hours. Thyroid Function Tests: Recent Labs    08/12/23 2228  TSH 7.937*  FREET4 1.13*  T3FREE 2.3   Anemia  Panel: No results for input(s): "VITAMINB12", "FOLATE", "FERRITIN", "TIBC", "IRON", "RETICCTPCT" in the last 72 hours. Sepsis Labs: Recent Labs  Lab 08/13/23 0036  LATICACIDVEN 1.0    Recent Results (from the past 240 hour(s))  Blood Culture (routine x 2)     Status: None (Preliminary result)   Collection Time: 08/12/23 10:28 PM   Specimen: BLOOD  Result Value Ref Range Status   Specimen Description   Final    BLOOD BLOOD RIGHT FOREARM Performed at [facility], [address], [place], [place] [address]    Special Requests   Final    BOTTLES DRAWN AEROBIC AND ANAEROBIC Blood Culture results may not be optimal due to an excessive volume of blood received in culture bottles Performed at [facility], [address] [place], [place] [address]    Culture   Final    NO GROWTH 1 DAY Performed at [facility], [address], [place], [place] [address]    Report Status PENDING  Incomplete  Blood Culture (routine x 2)     Status: None (Preliminary result)   Collection Time: 08/12/23 10:37 PM   Specimen: BLOOD  Result Value Ref Range Status   Specimen Description   Final    BLOOD BLOOD RIGHT FOREARM  Performed at [facility], [address], [place], [place] [address]    Special Requests   Final    BOTTLES DRAWN AEROBIC AND ANAEROBIC Blood Culture adequate volume Performed at [facility], [address], [place], [place] [address]    Culture   Final    NO GROWTH 1 DAY Performed at [facility], [address] [place], [place] [address]    Report Status PENDING  Incomplete  Urine Culture     Status: Abnormal   Collection Time: 08/13/23 12:24 AM   Specimen: Urine, Clean Catch  Result Value Ref Range Status   Specimen Description   Final    URINE, CLEAN CATCH Performed at [facility], [address] [place], [place] [address]    Special Requests   Final     NONE Performed at [facility], [address] [place], [place] [address]    Culture MULTIPLE SPECIES PRESENT, SUGGEST RECOLLECTION (A)  Final   Report Status 08/14/2023 FINAL  Final  MRSA Next Gen by PCR, Nasal     Status: None   Collection Time: 08/13/23 10:05 AM   Specimen: Nasal Mucosa; Nasal Swab  Result Value Ref Range Status   MRSA by PCR Next Gen NOT DETECTED NOT DETECTED Final    Comment: (NOTE) The GeneXpert MRSA Assay (FDA approved for NASAL specimens only), is one component of a comprehensive MRSA colonization surveillance program. It is not intended to diagnose MRSA infection nor to guide or monitor treatment for MRSA infections. Test performance is not FDA approved in patients less than 45 years old. Performed at [facility], [address], [place], [place] [address]     Radiology Studies: CT Head Wo Contrast  Result Date: 08/12/2023 CLINICAL DATA:  Mental status change, unknown cause EXAM: CT HEAD WITHOUT CONTRAST TECHNIQUE: Contiguous axial images were obtained from the base of the skull through the vertex without intravenous contrast. RADIATION DOSE REDUCTION: This exam was performed according to the departmental dose-optimization program which includes automated exposure control, adjustment of the mA and/or kV according to patient size and/or use of iterative reconstruction technique. COMPARISON:  None Available. FINDINGS: Brain: Patchy chronic small vessel disease throughout the deep white matter. No acute intracranial abnormality. Specifically, no hemorrhage, hydrocephalus, mass lesion, acute infarction, or significant intracranial injury. Vascular: No hyperdense vessel or unexpected calcification. Skull: No acute calvarial abnormality. Sinuses/Orbits: No acute findings Other: None IMPRESSION: No acute intracranial abnormality. Patchy chronic small vessel disease. Electronically Signed   By: Charlett Nose M.D.   On: 08/12/2023 23:33    DG Chest Port 1 View  Result Date: 08/12/2023 CLINICAL DATA:  Questionable sepsis EXAM: PORTABLE CHEST 1 VIEW COMPARISON:  Chest x-ray 05/11/2022 FINDINGS: Large hiatal hernia is again seen. The heart is enlarged, unchanged. There are small bilateral pleural effusions, left greater than right. There some infiltrates in the left lung base. Pneumothorax or acute fracture. IMPRESSION: 1. Cardiomegaly. 2. Small bilateral pleural effusions, left greater than right. 3. Infiltrate in the left lung base. 4. Large hiatal hernia. Electronically Signed   By: Darliss Cheney M.D.   On: 08/12/2023 23:28    Scheduled Meds:  apixaban  5 mg Oral BID   Chlorhexidine Gluconate Cloth  6 each Topical Daily   dorzolamide-timolol  1 drop Both Eyes BID   doxazosin  4 mg Oral Daily   finasteride  5 mg Oral Daily   latanoprost  1 drop Both Eyes  q morning   pravastatin  40 mg Oral Daily   Continuous Infusions:  cefTRIAXone (ROCEPHIN)  IV Stopped (08/14/23 0250)   doxycycline (VIBRAMYCIN) IV       LOS: 1 day    Time spent: 50 mins    Willeen Niece, MD [facility]   If 7PM-7AM, please contact night-coverage

## 2023-08-14 NOTE — Plan of Care (Signed)
  Problem: Health Behavior/Discharge Planning: Goal: Ability to manage health-related needs will improve Outcome: Progressing   Problem: Activity: Goal: Risk for activity intolerance will decrease Outcome: Progressing   Problem: Coping: Goal: Level of anxiety will decrease Outcome: Progressing   Problem: Pain Managment: Goal: General experience of comfort will improve Outcome: Progressing   Problem: Safety: Goal: Ability to remain free from injury will improve Outcome: Progressing   

## 2023-08-15 DIAGNOSIS — J189 Pneumonia, unspecified organism: Secondary | ICD-10-CM

## 2023-08-15 LAB — COMPREHENSIVE METABOLIC PANEL
ALT: 50 U/L — ABNORMAL HIGH (ref 0–44)
AST: 46 U/L — ABNORMAL HIGH (ref 15–41)
Albumin: 3 g/dL — ABNORMAL LOW (ref 3.5–5.0)
Alkaline Phosphatase: 60 U/L (ref 38–126)
Anion gap: 9 (ref 5–15)
BUN: 35 mg/dL — ABNORMAL HIGH (ref 8–23)
CO2: 24 mmol/L (ref 22–32)
Calcium: 8.4 mg/dL — ABNORMAL LOW (ref 8.9–10.3)
Chloride: 105 mmol/L (ref 98–111)
Creatinine, Ser: 1.3 mg/dL — ABNORMAL HIGH (ref 0.61–1.24)
GFR, Estimated: 51 mL/min — ABNORMAL LOW (ref >60)
Glucose, Bld: 98 mg/dL (ref 70–99)
Potassium: 3.9 mmol/L (ref 3.5–5.1)
Sodium: 138 mmol/L (ref 135–145)
Total Bilirubin: 0.8 mg/dL (ref 0.3–1.2)
Total Protein: 5.8 g/dL — ABNORMAL LOW (ref 6.5–8.1)

## 2023-08-15 LAB — CBC
HCT: 34.6 % — ABNORMAL LOW (ref 39.0–52.0)
Hemoglobin: 11 g/dL — ABNORMAL LOW (ref 13.0–17.0)
MCH: 30.3 pg (ref 26.0–34.0)
MCHC: 31.8 g/dL (ref 30.0–36.0)
MCV: 95.3 fL (ref 80.0–100.0)
Platelets: 101 10*3/uL — ABNORMAL LOW (ref 150–400)
RBC: 3.63 MIL/uL — ABNORMAL LOW (ref 4.22–5.81)
RDW: 15.7 % — ABNORMAL HIGH (ref 11.5–15.5)
WBC: 4.2 10*3/uL (ref 4.0–10.5)
nRBC: 0 % (ref 0.0–0.2)

## 2023-08-15 LAB — PHOSPHORUS: Phosphorus: 3.5 mg/dL (ref 2.5–4.6)

## 2023-08-15 LAB — MAGNESIUM: Magnesium: 2.1 mg/dL (ref 1.7–2.4)

## 2023-08-15 NOTE — Progress Notes (Signed)
PROGRESS NOTE    Barry Burgess  FAO:[id number]  DOB: [birth year]  DOA: 08/12/2023 PCP: [name], MD Outpatient Specialists:   Hospital course:   89 years old male with PMH significant for BPH, hyperlipidemia, hypertension, paroxysmal A-fib was brought in by family with generalized weakness,  fatigue and altered mental status.  Family reports patient was dehydrated a month ago so his Lasix dose was decreased to daily. Now patient's weight has increased by 5 pounds and Lasix was restarted.  Family has noticed generalized slowing.  Family was concerned about dehydration so they brought him here.  Patient was hypothermic with a temp of 92.2,  bradycardic with heart rate of 59 and hypotensive with  blood pressure of 104/75.  Blood and urine cultures were collected. Patient was placed on Bair hugger's.  Lab work consistent with UTI, started on IV Rocephin.    Subjective:  Patient seen with son and daughter-in-law at bedside.  Patient states he is feeling better, denies shortness of breath or any discomfort.  Son was concerned that his father was very confused when he came in this morning however now has returned to his baseline mental status.  Discussed issues of sundowning/metabolic encephalopathy/delirium in the elderly when acutely ill and in an ICU type setting.   Objective: Vitals:   08/15/23 1000 08/15/23 1100 08/15/23 1200 08/15/23 1300  BP: 138/82  123/62 123/65  Pulse: 70 76 69 60  Resp: 16 19 16 20   Temp:  (!) 97.5 F (36.4 C)    TempSrc:  Oral    SpO2: 95% 93% 92% 92%  Weight:      Height:        Intake/Output Summary (Last 24 hours) at 08/15/2023 1702 Last data filed at 08/15/2023 1520 Gross per 24 hour  Intake 760.38 ml  Output 800 ml  Net -39.62 ml   Filed Weights   08/12/23 2223 08/13/23 0935  Weight: 87.5 kg 81.7 kg     Exam:  General: Friendly gentleman sitting up at 20 degrees chatting comfortably with his family.  Patient is hard of hearing. Eyes:  sclera anicteric, conjuctiva mild injection bilaterally CVS: S1-S2, regular  Respiratory:  decreased air entry bilaterally secondary to decreased inspiratory effort, rales at bases  GI: NABS, soft, NT  LE: Brawny edema bilaterally with stasis dermatitis bilaterally, some hyperkeratosis and areas of scabbing.  No evidence for infection or cellulitis. Neuro: A/O x 3,  grossly nonfocal.  Patient is very hard of hearing. Psych: patient is logical and coherent, judgement and insight appear normal, mood and affect appropriate to situation.  Data Reviewed:  Basic Metabolic Panel: Recent Labs  Lab 08/12/23 2228 08/13/23 0432 08/13/23 0538 08/15/23 0249  NA 134*  --  136 138  K 3.9  --  3.9 3.9  CL 96*  --  99 105  CO2 28  --  28 24  GLUCOSE 134*  --  127* 98  BUN 28*  --  26* 35*  CREATININE 1.26* 1.17 1.15 1.30*  CALCIUM 8.8*  --  8.6* 8.4*  MG  --   --   --  2.1  PHOS  --   --   --  3.5    CBC: Recent Labs  Lab 08/12/23 2228 08/13/23 0432 08/15/23 0249  WBC 5.1 4.7 4.2  NEUTROABS 3.8 3.5  --   HGB 12.1* 10.7* 11.0*  HCT 37.7* 32.1* 34.6*  MCV 93.5 93.0 95.3  PLT 114* 107* 101*     Scheduled Meds:  apixaban  5 mg Oral BID   Chlorhexidine Gluconate Cloth  6 each Topical Daily   dorzolamide-timolol  1 drop Both Eyes BID   doxazosin  4 mg Oral Daily   finasteride  5 mg Oral Daily   latanoprost  1 drop Both Eyes q morning   pravastatin  40 mg Oral Daily   Continuous Infusions:  cefTRIAXone (ROCEPHIN)  IV Stopped (08/15/23 0245)   doxycycline (VIBRAMYCIN) IV Stopped (08/15/23 1215)     Assessment & Plan:   UTI CAP Resolved sepsis physiology Has responded well to treatment with antibiotics and IV fluid resuscitation Patient's hypothermia has normalized, bradycardia and hypotension have resolved Patient is no longer requiring oxygen while at rest Continue present management with ceftriaxone and doxycycline  Metabolic encephalopathy/sundowning Patient apparently  was very confused last night into this morning To my examination patient is charming and friendly and logical and coherent Son notes that he is back to baseline We discussed at length issues surrounding delirium in the elderly when acutely ill and in an ICU setting Noted that it can wax and wane and reappear after seeming resolution Disussed delirium precautions and how helpful it is to have family present to reorient patient Continue delirium precautions, avoid benzodiazepines  AKI Right heart failure Chronic LE edema Initially had resolved with fluid resuscitation however creatinine is back up to 1.26 Will provide gentle hydration however I am worried about increased lower extremity edema given known right heart failure Would restart Lasix tomorrow  Abnormal LFTs Slowly improving, likely secondary to hypotension/decreased perfusion  Atrial fibrillation Rate is controlled off of rate control medications, indeed patient was bradycardic on admission likely has some level of conduction system abnormality which can be evaluated as an outpatient if warranted Continue Eliquis for secondary stroke prevention  BPH Continue finasteride     DVT prophylaxis: Eliquis Code Status: DNR Family Communication: Son and daughter-in-law are at bedside throughout     Studies: No results found.  Principal Problem:   Hypothermia Active Problems:   HTN (hypertension)   HLD (hyperlipidemia)   Paroxysmal atrial fibrillation (HCC)   AKI (acute kidney injury) (HCC)   Acute metabolic encephalopathy   Sepsis secondary to UTI (HCC)     Horatio Pel Orma Flaming, [facility]  If 7PM-7AM, please contact night-coverage [email or web address]   LOS: 2 days

## 2023-08-15 NOTE — Plan of Care (Signed)

## 2023-08-15 NOTE — TOC Initial Note (Signed)
Transition of Care [facility]) - Initial/Assessment Note    Patient Details  Name: Barry Burgess MRN: [id number] Date of Birth: 1926-10-14  Transition of [facility] CM/SW Contact:    Carmina Miller, LCSWA Phone Number: 08/15/2023, 9:30 AM  Clinical Narrative:                  Pt hard of hearing, tried to contact pt's son via phone concerning dc plans, no answer, vm left. TOC will continue to follow.        Patient Goals and CMS Choice            Expected Discharge Plan and Services                                              Prior Living Arrangements/Services                       Activities of Daily Living Home Assistive Devices/Equipment: Dan Humphreys (specify type), Eyeglasses, Raised toilet seat with rails, Grab bars in shower ADL Screening (condition at time of admission) Patient's cognitive ability adequate to safely complete daily activities?: Yes Is the patient deaf or have difficulty hearing?: Yes Does the patient have difficulty seeing, even when wearing glasses/contacts?: No Does the patient have difficulty concentrating, remembering, or making decisions?: No Patient able to express need for assistance with ADLs?: Yes Does the patient have difficulty dressing or bathing?: Yes Independently performs ADLs?: No Communication: Independent Dressing (OT): Independent Grooming: Independent Feeding: Independent Bathing: Independent Toileting: Independent with device (comment) In/Out Bed: Independent Walks in Home: Independent with device (comment) Does the patient have difficulty walking or climbing stairs?: Yes Weakness of Legs: Both Weakness of Arms/Hands: Both  Permission Sought/Granted                  Emotional Assessment              Admission diagnosis:  Hypothermia [T68.XXXA] Hypothermia, initial encounter [T68.XXXA] Altered mental status, unspecified altered mental status type [R41.82] Hypothyroidism, unspecified type  [E03.9] Infiltrate of left lung present on chest x-ray [R91.8] Patient Active Problem List   Diagnosis Date Noted   Acute metabolic encephalopathy 08/13/2023   Sepsis secondary to UTI (HCC) 08/13/2023   Hypothermia 08/13/2023   Paroxysmal atrial fibrillation (HCC) 05/11/2022   CHF exacerbation (HCC) 05/11/2022   Atrial fibrillation with slow ventricular response (HCC) 05/11/2022   AKI (acute kidney injury) (HCC) 05/11/2022   HTN (hypertension) 10/14/2015   HLD (hyperlipidemia) 10/14/2015   BPH (benign prostatic hyperplasia)    PCP:  [name], MD Pharmacy:   [id number] - [place], [place] - [address] [place] [address] Phone: [phone] Fax: [phone]  [facility] - [place], [place] - [address] [place] [address] Phone: [phone] Fax: [phone]     Social Determinants of Health (SDOH) Social History: SDOH Screenings   Food Insecurity: No Food Insecurity (08/13/2023)  Housing: Low Risk  (08/13/2023)  Transportation Needs: No Transportation Needs (08/13/2023)  Utilities: Not At Risk (08/13/2023)  Alcohol Screen: Low Risk  (05/14/2022)  Financial Resource Strain: Low Risk  (05/14/2022)  Tobacco Use: Medium Risk (08/12/2023)   SDOH Interventions:     Readmission Risk  Interventions     No data to display

## 2023-08-16 DIAGNOSIS — T68XXXA Hypothermia, initial encounter: Secondary | ICD-10-CM | POA: Diagnosis not present

## 2023-08-16 LAB — CBC
HCT: 34 % — ABNORMAL LOW (ref 39.0–52.0)
Hemoglobin: 10.9 g/dL — ABNORMAL LOW (ref 13.0–17.0)
MCH: 31.1 pg (ref 26.0–34.0)
MCHC: 32.1 g/dL (ref 30.0–36.0)
MCV: 97.1 fL (ref 80.0–100.0)
Platelets: 103 10*3/uL — ABNORMAL LOW (ref 150–400)
RBC: 3.5 MIL/uL — ABNORMAL LOW (ref 4.22–5.81)
RDW: 15.9 % — ABNORMAL HIGH (ref 11.5–15.5)
WBC: 3.8 10*3/uL — ABNORMAL LOW (ref 4.0–10.5)
nRBC: 0 % (ref 0.0–0.2)

## 2023-08-16 LAB — BASIC METABOLIC PANEL
Anion gap: 5 (ref 5–15)
BUN: 34 mg/dL — ABNORMAL HIGH (ref 8–23)
CO2: 25 mmol/L (ref 22–32)
Calcium: 8.3 mg/dL — ABNORMAL LOW (ref 8.9–10.3)
Chloride: 109 mmol/L (ref 98–111)
Creatinine, Ser: 1.32 mg/dL — ABNORMAL HIGH (ref 0.61–1.24)
GFR, Estimated: 50 mL/min — ABNORMAL LOW (ref >60)
Glucose, Bld: 104 mg/dL — ABNORMAL HIGH (ref 70–99)
Potassium: 3.8 mmol/L (ref 3.5–5.1)
Sodium: 139 mmol/L (ref 135–145)

## 2023-08-16 MED ORDER — DOXYCYCLINE HYCLATE 100 MG PO TABS
100.0000 mg | ORAL_TABLET | Freq: Two times a day (BID) | ORAL | Status: DC
  Administered 2023-08-16: 100 mg via ORAL
  Filled 2023-08-16 (×2): qty 1

## 2023-08-16 MED ORDER — SODIUM CHLORIDE 0.9 % IV SOLN: INTRAVENOUS | Status: DC

## 2023-08-16 NOTE — NC FL2 (Signed)
[place] MEDICAID FL2 LEVEL OF CARE FORM     IDENTIFICATION  Patient Name: Barry Burgess Birthdate: 11-04-1926 Sex: male Admission Date (Current Location): 08/12/2023  [facility] and [place] Number:  Producer, television/film/video and Address:  [facility],  [address], [place] [address]      Provider Number:    Attending Physician Name and Address:  Rhetta Mura, MD  Relative Name and Phone Number:  Hylan, Gatt (Son)  [phone] [facility])    Current Level of Care: Hospital Recommended Level of Care: Skilled Nursing Facility Prior Approval Number:    Date Approved/Denied:   PASRR Number:    Discharge Plan: SNF    Current Diagnoses: Patient Active Problem List   Diagnosis Date Noted   Acute metabolic encephalopathy 08/13/2023   Sepsis secondary to UTI (HCC) 08/13/2023   Hypothermia 08/13/2023   Paroxysmal atrial fibrillation (HCC) 05/11/2022   CHF exacerbation (HCC) 05/11/2022   Atrial fibrillation with slow ventricular response (HCC) 05/11/2022   AKI (acute kidney injury) (HCC) 05/11/2022   HTN (hypertension) 10/14/2015   HLD (hyperlipidemia) 10/14/2015   BPH (benign prostatic hyperplasia)     Orientation RESPIRATION BLADDER Height & Weight     Self, Time, Situation, Place  Normal Incontinent Weight: 81.7 kg Height:  6' (182.9 cm)  BEHAVIORAL SYMPTOMS/MOOD NEUROLOGICAL BOWEL NUTRITION STATUS      Continent Diet (Heart)  AMBULATORY STATUS COMMUNICATION OF NEEDS Skin   Limited Assist Verbally PU Stage and Appropriate Care   PU Stage 2 Dressing: Daily (Unstageable full thickness with slough and eschar on buttock, form dressing)                   Personal Care Assistance Level of Assistance  Bathing, Feeding, Dressing Bathing Assistance: Maximum assistance Feeding assistance: Limited assistance Dressing Assistance: Maximum assistance     Functional Limitations Info  Sight, Hearing, Speech Sight Info: Adequate Hearing Info:  Adequate Speech Info: Adequate    SPECIAL CARE FACTORS FREQUENCY  PT (By licensed PT), OT (By licensed OT)     PT Frequency: 5 X per week OT Frequency: 5 X per week            Contractures Contractures Info: Not present    Additional Factors Info                  Current Medications (08/16/2023):  This is the current hospital active medication list Current Facility-Administered Medications  Medication Dose Route Frequency Provider Last Rate Last Admin   acetaminophen (TYLENOL) tablet 650 mg  650 mg Oral Q6H PRN Crosley, Debby, MD       Or   acetaminophen (TYLENOL) suppository 650 mg  650 mg Rectal Q6H PRN Crosley, Debby, MD       apixaban (ELIQUIS) tablet 5 mg  5 mg Oral BID Crosley, Debby, MD   5 mg at 08/16/23 0935   cefTRIAXone (ROCEPHIN) 1 g in sodium chloride 0.9 % 100 mL IVPB  1 g Intravenous Q24H Gery Pray, MD   Stopped at 08/16/23 0308   Chlorhexidine Gluconate Cloth 2 % PADS 6 each  6 each Topical Daily Willeen Niece, MD   6 each at 08/16/23 0943   dorzolamide-timolol (COSOPT) 2-0.5 % ophthalmic solution 1 drop  1 drop Both Eyes BID Gery Pray, MD   1 drop at 08/16/23 0942   doxazosin (CARDURA) tablet 4 mg  4 mg Oral Daily Crosley, Debby, MD   4 mg at 08/16/23 0935   doxycycline (VIBRAMYCIN)  100 mg in sodium chloride 0.9 % 250 mL IVPB  100 mg Intravenous BID Willeen Niece, MD   Stopped at 08/16/23 1146   finasteride (PROSCAR) tablet 5 mg  5 mg Oral Daily Crosley, Debby, MD   5 mg at 08/16/23 0935   latanoprost (XALATAN) 0.005 % ophthalmic solution 1 drop  1 drop Both Eyes q morning Crosley, Debby, MD   1 drop at 08/16/23 0943   Oral care mouth rinse  15 mL Mouth Rinse PRN Willeen Niece, MD       pravastatin (PRAVACHOL) tablet 40 mg  40 mg Oral Daily Crosley, Debby, MD   40 mg at 08/16/23 0935   senna-docusate (Senokot-S) tablet 1 tablet  1 tablet Oral QHS PRN Gery Pray, MD         Discharge Medications: Please see discharge summary for a list  of discharge medications.  Relevant Imaging Results:  Relevant Lab Results:   Additional Information    Harriett Sine, RN

## 2023-08-16 NOTE — Plan of Care (Signed)
  Problem: Clinical Measurements: Goal: Ability to maintain clinical measurements within normal limits will improve Outcome: Progressing Goal: Diagnostic test results will improve Outcome: Progressing   Problem: Nutrition: Goal: Adequate nutrition will be maintained Outcome: Progressing   

## 2023-08-16 NOTE — Progress Notes (Signed)
Physical Therapy Treatment Patient Details Name: Barry Burgess MRN: [id number] DOB: Aug 12, 1926 Today's Date: 08/16/2023   History of Present Illness 89 year old gentleman admitted with weakness, fatigue & AMS. Dx of UTI, AKI. Past medical history of BPH, HLD, HTN, PAF    PT Comments  The patient agreeable to get up into recliner.  The patient required min to mod assistance  to stand and step to recliner, Stood  and stepped in place x 10  in standing holding onto Rw.  The patient's family express concern that patient is not putting forth effort  as patient was ambulatory  PTA.  Continue PT for progressive mobility.  Noted HR briefly 147 prior to mobilizing, down to 69 after in recliner.     If plan is discharge home, recommend the following: A lot of help with walking and/or transfers;A lot of help with bathing/dressing/bathroom;Assistance with cooking/housework;Assist for transportation;Help with stairs or ramp for entrance   Can travel by private vehicle     Yes  Equipment Recommendations       Recommendations for Other Services       Precautions / Restrictions Precautions Precautions: Fall Restrictions Weight Bearing Restrictions: No     Mobility  Bed Mobility Overal bed mobility: Needs Assistance Bed Mobility: Supine to Sit     Supine to sit: Used rails, HOB elevated, Mod assist     General bed mobility comments: assist with trunk to sit upright    Transfers Overall transfer level: Needs assistance Equipment used: Rolling walker (2 wheels) Transfers: Sit to/from Stand, Bed to chair/wheelchair/BSC Sit to Stand: Min assist   Step pivot transfers: Min assist       General transfer comment: Steady assist to rise up from bed, step to recliner, stepped in place x 10  at RW.    Ambulation/Gait                   Stairs             Wheelchair Mobility     Tilt Bed    Modified Rankin (Stroke Patients Only)       Balance Overall  balance assessment: Needs assistance Sitting-balance support: No upper extremity supported, Feet unsupported Sitting balance-Leahy Scale: Good     Standing balance support: Bilateral upper extremity supported, During functional activity, Reliant on assistive device for balance Standing balance-Leahy Scale: Poor                              Cognition Arousal: Alert Behavior During Therapy: WFL for tasks assessed/performed   Area of Impairment: Awareness, Safety/judgement                                        Exercises      General Comments        Pertinent Vitals/Pain Pain Assessment Pain Assessment: No/denies pain    Home Living                          Prior Function            PT Goals (current goals can now be found in the care plan section) Progress towards PT goals: Progressing toward goals    Frequency    Min 1X/week      PT Plan  Co-evaluation              AM-PAC PT "6 Clicks" Mobility   Outcome Measure  Help needed turning from your back to your side while in a flat bed without using bedrails?: A Little Help needed moving from lying on your back to sitting on the side of a flat bed without using bedrails?: A Lot Help needed moving to and from a bed to a chair (including a wheelchair)?: A Lot Help needed standing up from a chair using your arms (e.g., wheelchair or bedside chair)?: A Lot Help needed to walk in hospital room?: Total Help needed climbing 3-5 steps with a railing? : Total 6 Click Score: 11    End of Session Equipment Utilized During Treatment: Gait belt Activity Tolerance: Patient tolerated treatment well Patient left: with family/visitor present;in chair;with call bell/phone within reach;with chair alarm set Nurse Communication: Mobility status PT Visit Diagnosis: Adult, failure to thrive (R62.7);Muscle weakness (generalized) (M62.81);Difficulty in walking, not elsewhere classified  (R26.2)     Time: 1610-9604 PT Time Calculation (min) (ACUTE ONLY): 28 min  Charges:    $Therapeutic Activity: 23-37 mins PT General Charges $$ ACUTE PT VISIT: 1 Visit                     Blanchard Kelch PT Acute Rehabilitation Services Office [phone] Weekend [phone]    Rada Hay 08/16/2023, 1:08 PM

## 2023-08-16 NOTE — Progress Notes (Signed)
SNF Bed request sent. Spoke with son pt to stepdown today. TOC needs to follow pt eval.   08/16/23 1651  TOC Assessment  TOC screening is complete Yes  Once discharged, how will the patient get to their discharge location? Ambulance  Expected Discharge Plan Home w Home Health Services  Final next level of care Home w Home Health Services  Barriers to Discharge No Barriers Identified  Patient states their goals for this hospitalization and ongoing recovery are: none stated  CMS Medicare.gov Compare Post Acute Care list provided to: Patient Represenative (must comment)  Post Acute Care Choice Home Health (waiting on PT eval)  Living arrangements for the past 2 months Single Family Home  Lives with: Adult Children  In-house Referral NA  DME Agency NA  HH Arranged NA  Do you feel safe going back to the place where you live? Y  Permission granted to share information with  No  Patient language and need for interpreter reviewed: No  Criminal Activity/Legal Involvement Pertinent to Current Situation/Hospitalization No - Comment as needed  Need for Family Participation in Patient Care Y (Son Gerlene Burdock)  Care giver support system in place? Y  Appearance: Appears stated age  Attitude/Demeanor/Rapport Lethargic  Affect (typically observed) Accepting;Calm  Orientation:  Oriented to Self;Oriented to Place;Oriented to  Time;Oriented to Situation  Alcohol / Substance Use  (unable to assess)  Psych Involvement N

## 2023-08-16 NOTE — Progress Notes (Signed)
HOSPITALIST ROUNDING NOTE WOJCIECH RUS WGN:[id number]  DOB: 1926-10-26  DOA: 08/12/2023  PCP: [name], MD  08/16/2023,6:41 AM   LOS: 3 days      Code Status: DNR   From:  Home    Current Dispo: unclear     89 year old HTN HLD BPH A-fib CHA2DS2-VASc >3, diagnosed 2016 (cardioversion) on anticoagulation-complicated by tachybradycardia syndrome Aortic regurgitation 2018 echo-last echo EF 50-55% global hypokinesis low normal function Hospitalization 5/23 acute decompensated HFpEF at the time-diuresed 30 pounds at the time Last seen OV Dr. Rudene Anda the notes it details need for palliative care--Eliquis was reduced to 2.5 twice daily Lasix was decreased to 40 daily and a workup for "weakness" was carried out Telephone note 08/03/2023 patient seen to be short of breath Lasix was increased to 40 twice daily  Patient was brought in by EMS 8/22 with on and off confusion not drinking well not wanting to get up and go to the bathroom-family been pushing fluids but patient remained confused patient has been drinking more water and this seems to have improved mental state--- family documented that patient's weight had increased about 5 pounds BP was 90/60 on arrival to the ED-as he was slightly hypothermic was placed on a Bair hugger Patient was found to have bilateral lower extremity wounds with serous drainage no erythema-was hypotensive to the 90s on 60s slightly hypothermic--- WBC normal 1112 creatinine elevated to 1.2 >normal LFTs AST/ALT mildly elevated 88/60 BNP 217 Lactic acid 1.0  CXR suggestive LLL infiltrate not well-appreciated on single view He was given ceftriaxone and admitted for toxic metabolic encephalopathy--- chronic series wounds lower extremities?UTI?  Pneumonia with acute kidney injury additional   Plan   Toxic metabolic encephalopathy on admission Mentation much improved and was probably secondary to infectious etiology-no further workup  Acute  hypoxic respiratory failure  with possible PNA  unlikely UTI in the setting of BPH will resume home Urine culture shows multiple colony-forming units blood cultures 8/22 are negative x 3 days CXR performed 8/22 shows infiltrate left lung base so this is a likely etiology-I will repeat an x-ray 2 view a.m. 8/27 Given significant improvement we will narrow antibiotics to oral doxycycline 100 twice daily and discontinue ceftriaxone   A-fib/tachybradycardia syndrome CHA2DS2-VASc 2 > 3 on Eliquis 2.5 twice daily Continuing apixaban 5 twice daily:   reactive transaminitis  mild thrombocytopenia LFTs have trended down since admission he has no alk phos she has Trulicity is not obstructive bilirubin is normal I expect this will resolve  AKI on admission Lasix 40 twice daily has been held Start saline 75 cc/H   chronic bilateral lower extremity swelling wounds etc. Wounds are not weeping however he has some mild lower extremity swelling-monitor   generalized frailty and adult failure to thrive in a 89 year old   DVT prophylaxis: Apixaban  Status is: Inpatient Remains inpatient appropriate because:   Requires stability and more mobilization given needing extra mobility    Subjective:  Awake coherent in nad Completely coherent No fever no chills We shared our related [place] ancestry--he was quite excited about this!  Objective + exam Vitals:   08/15/23 2200 08/15/23 2300 08/16/23 0000 08/16/23 0345  BP: (!) 107/58  (!) 104/42 (!) 123/58  Pulse: 61 (!) 59 (!) 52 61  Resp: 14 13 12 14   Temp:      TempSrc:      SpO2: 95% 96% 92% 94%  Weight:      Height:       Filed  Weights   08/12/23 2223 08/13/23 0935  Weight: 87.5 kg 81.7 kg    Examination:  Eomi ncat no focal deficit No wheeze rales rhonchi Abd soft nt nd no rebound S1 S2 no m/r/g Slight swelling of LE's bruising to LE's as well--chronic venous stasis changes   Data Reviewed: reviewed   CBC    Component Value Date/Time   WBC 3.8 (L)  08/16/2023 0307   RBC 3.50 (L) 08/16/2023 0307   HGB 10.9 (L) 08/16/2023 0307   HGB 14.5 06/17/2023 0933   HCT 34.0 (L) 08/16/2023 0307   HCT 45.1 06/17/2023 0933   PLT 103 (L) 08/16/2023 0307   PLT 184 06/17/2023 0933   MCV 97.1 08/16/2023 0307   MCV 95 06/17/2023 0933   MCH 31.1 08/16/2023 0307   MCHC 32.1 08/16/2023 0307   RDW 15.9 (H) 08/16/2023 0307   RDW 13.3 06/17/2023 0933   LYMPHSABS 0.6 (L) 08/13/2023 0432   MONOABS 0.4 08/13/2023 0432   EOSABS 0.0 08/13/2023 0432   BASOSABS 0.0 08/13/2023 0432      Latest Ref Rng & Units 08/16/2023    3:04 AM 08/15/2023    2:49 AM 08/13/2023    5:38 AM  CMP  Glucose 70 - 99 mg/dL 914  98  782   BUN 8 - 23 mg/dL 34  35  26   Creatinine 0.61 - 1.24 mg/dL 9.56  2.13  0.86   Sodium 135 - 145 mmol/L 139  138  136   Potassium 3.5 - 5.1 mmol/L 3.8  3.9  3.9   Chloride 98 - 111 mmol/L 109  105  99   CO2 22 - 32 mmol/L 25  24  28    Calcium 8.9 - 10.3 mg/dL 8.3  8.4  8.6   Total Protein 6.5 - 8.1 g/dL  5.8  6.0   Total Bilirubin 0.3 - 1.2 mg/dL  0.8  0.5   Alkaline Phos 38 - 126 U/L  60  62   AST 15 - 41 U/L  46  66   ALT 0 - 44 U/L  50  57      Scheduled Meds:  apixaban  5 mg Oral BID   Chlorhexidine Gluconate Cloth  6 each Topical Daily   dorzolamide-timolol  1 drop Both Eyes BID   doxazosin  4 mg Oral Daily   doxycycline  100 mg Oral Q12H   finasteride  5 mg Oral Daily   latanoprost  1 drop Both Eyes q morning   pravastatin  40 mg Oral Daily   Continuous Infusions:  sodium chloride      Time  45  Rhetta Mura, MD  [facility]

## 2023-08-16 NOTE — Progress Notes (Deleted)
   08/16/23 1216  [place] Medicaid FL2 Level of Care Screening Tool  [facility] and Address [facility]  Relative Name and Phone Auguster, Eftink (Son)  [phone] [facility])  Current Level of Care Hospital  Requested Level of Care SNF  Discharge Plan SNF  Orientation Self;Time;Situation;Place  Personal Care Assistance Bathing;Feeding;Dressing  Bathing Assistance Maximum assistance  Feeding assistance Limited assistance  Dressing Assistance Maximum assistance  Ambulatory Status Limited Assist  Functional Limitations Sight;Hearing;Speech  Sight Info Adequate  Hearing Info Adequate  Speech Info Adequate  Bladder Incontinent  Bowel Continent  Communication of Needs Verbally  Skin PU Stage and Appropriate Care  PU Stage 2 Dressing Daily (Unstageable full thickness with slough and eschar on buttock, form dressing)  Respiration Normal  Nutrition Status Diet (Heart)  Special Care Factors PT (By licensed PT);OT (By licensed OT)  PT Frequency 5 X per week  OT Frequency 5 X per week  Contractures Info Not present

## 2023-08-17 ENCOUNTER — Inpatient Hospital Stay (HOSPITAL_COMMUNITY): Payer: Medicare Other

## 2023-08-17 DIAGNOSIS — A419 Sepsis, unspecified organism: Secondary | ICD-10-CM | POA: Diagnosis not present

## 2023-08-17 DIAGNOSIS — R262 Difficulty in walking, not elsewhere classified: Secondary | ICD-10-CM | POA: Diagnosis present

## 2023-08-17 DIAGNOSIS — N390 Urinary tract infection, site not specified: Secondary | ICD-10-CM | POA: Diagnosis not present

## 2023-08-17 MED ORDER — SODIUM CHLORIDE 0.9 % IV SOLN
1.0000 g | Freq: Once | INTRAVENOUS | Status: AC
  Administered 2023-08-17: 1 g via INTRAVENOUS
  Filled 2023-08-17: qty 10

## 2023-08-17 NOTE — Progress Notes (Signed)
Occupational Therapy Treatment Patient Details Name: Barry Burgess MRN: [id number] DOB: 05-Aug-1926 Today's Date: 08/17/2023   History of present illness Patient is a 89 year old gentleman admitted with weakness, fatigue & AMS. Dx of UTI, AKI. Past medical history of BPH, HLD, HTN, PAF   OT comments  Patient was noted to make some improvements towards goals. Patient was noted to have some conflicting reports with patient able to participate in BUE ROM movements with BLE elevated and then moments later report that he cannot feed himself with BLE elevated. Patient reported " I can't coordinate my arms while my feet are like this". Patient remained able to take sip of cup of water in room and participate in bilateral integration exercises. Nursing educated on importance of getting feet down for meals and then back elevated. Nurse and NT on hall verbalized understanding. Patients family present in room during session. Patient would continue to benefit from skilled OT services at this time while admitted and after d/c to address noted deficits in order to improve overall safety and independence in ADLs.        If plan is discharge home, recommend the following:  A lot of help with walking and/or transfers;A lot of help with bathing/dressing/bathroom;Assistance with cooking/housework;Assist for transportation;Help with stairs or ramp for entrance   Equipment Recommendations  None recommended by OT       Precautions / Restrictions Precautions Precautions: Fall Restrictions Weight Bearing Restrictions: No       Mobility Bed Mobility               General bed mobility comments: patient was up in recliner at start of session and returned to the same at end of session.         Balance Overall balance assessment: Needs assistance Sitting-balance support: No upper extremity supported, Feet unsupported Sitting balance-Leahy Scale: Good     Standing balance support: Bilateral upper  extremity supported, During functional activity, Reliant on assistive device for balance Standing balance-Leahy Scale: Poor Standing balance comment: LOB with door navigation         ADL either performed or assessed with clinical judgement   ADL Overall ADL's : Needs assistance/impaired   Eating/Feeding Details (indicate cue type and reason): patient was educated on importance of elevating feet in chair to reduce edema and maintian skin integrity. patient verbalized understanding. upon getting feet elevated, patient reported " i cant feed myself". patients lunch tray had just been ordered by family and had not arrived yet. patient reproted that with his legs in current position "lying down" he is unable to use his arms to feed himself. patient was able to demonstrate full ROM of BUE 1 minute before this with no issues and no reports of pain. patient was positioned with tray table in front of him with cup of water. patient was able to pick up cup of water and take sips with no spillage. patient then reported ' i am unable to coordinate using fork and knife at the same time with feet like this so they will have to feed me". family was in room also encouraging patient to self feed when tray arrived. patients NT and nurse edcuated on putting patients feet down for self feeding but then returning to elevated position. Grooming: Standing;Wash/dry face;Supervision/safety Grooming Details (indicate cue type and reason): patient needed cues to scan room for items like soap dispencer and paper towels.  Toilet Transfer: Moderate assistance;Rolling walker (2 wheels);Ambulation Toilet Transfer Details (indicate cue type and reason): to get up off commode with patient unable to find grab bar or toielt paper without cues. patient needed min A after loss of balance with patient unable to hear cues to side step away from door v/s. standing in front of it and pulling it to you. Toileting-  Clothing Manipulation and Hygiene: Minimal assistance;Sit to/from stand Toileting - Clothing Manipulation Details (indicate cue type and reason): with encouargement for patient to participate in task himself prior to asking for help            Extremity/Trunk Assessment Upper Extremity Assessment Upper Extremity Assessment: Generalized weakness (noted to have edema around bilateral elbows posteriorly with brusing.)             Cognition Arousal: Alert Behavior During Therapy: WFL for tasks assessed/performed Overall Cognitive Status: Difficult to assess       General Comments: patients family in room. patient does not have hearing aids in room. patient had a hard time hearing therapist giving cues for safety during session. patient appears to have aspects of learned helplessness during session.                   Pertinent Vitals/ Pain       Pain Assessment Pain Assessment: No/denies pain         Frequency  Min 1X/week        Progress Toward Goals  OT Goals(current goals can now be found in the care plan section)  Progress towards OT goals: Progressing toward goals     Plan         AM-PAC OT "6 Clicks" Daily Activity     Outcome Measure   Help from another person eating meals?: None Help from another person taking care of personal grooming?: A Little Help from another person toileting, which includes using toliet, bedpan, or urinal?: A Lot Help from another person bathing (including washing, rinsing, drying)?: A Lot Help from another person to put on and taking off regular upper body clothing?: A Little Help from another person to put on and taking off regular lower body clothing?: A Lot 6 Click Score: 16    End of Session Equipment Utilized During Treatment: Rolling walker (2 wheels)  OT Visit Diagnosis: Unsteadiness on feet (R26.81);Other abnormalities of gait and mobility (R26.89);Muscle weakness (generalized) (M62.81)   Activity Tolerance  Patient tolerated treatment well   Patient Left in chair;with call bell/phone within reach;with chair alarm set;with family/visitor present   Nurse Communication Mobility status        Time: 1057-1140 OT Time Calculation (min): 43 min  Charges: OT General Charges $OT Visit: 1 Visit OT Treatments $Self Care/Home Management : 38-52 mins  Rosalio Loud, MS Acute Rehabilitation Department Office# [phone]   Selinda Flavin 08/17/2023, 12:35 PM

## 2023-08-17 NOTE — Plan of Care (Signed)

## 2023-08-17 NOTE — Plan of Care (Signed)

## 2023-08-17 NOTE — TOC Progression Note (Addendum)
Transition of Care [facility]) - Progression Note    Patient Details  Name: Barry Burgess MRN: [id number] Date of Birth: [birth year]  Transition of Care [facility]) CM/SW Contact  Otelia Santee, LCSW Phone Number: 08/17/2023, 9:27 AM  Clinical Narrative:    PASRR requested and assigned: [id number].   Left voicemail with pt's son to review bed offers for SNF. Awaiting return call.   ADDENDUM: Met with pt to review bed offers. Pt shares he wants a private room at [facility] Pt requests CSW to speak to son about SNF choice. Attempted to reach son again however, was sent straight to VM.    Expected Discharge Plan: Skilled Nursing Facility Barriers to Discharge: No Barriers Identified  Expected Discharge Plan and Services In-house Referral: NA   Post Acute Care Choice: Home Health (waiting on PT eval) Living arrangements for the past 2 months: Single Family Home                   DME Agency: NA       HH Arranged: NA           Social Determinants of Health (SDOH) Interventions SDOH Screenings   Food Insecurity: No Food Insecurity (08/13/2023)  Housing: Low Risk  (08/13/2023)  Transportation Needs: No Transportation Needs (08/13/2023)  Utilities: Not At Risk (08/13/2023)  Alcohol Screen: Low Risk  (05/14/2022)  Financial Resource Strain: Low Risk  (05/14/2022)  Tobacco Use: Medium Risk (08/12/2023)    Readmission Risk Interventions     No data to display

## 2023-08-17 NOTE — Progress Notes (Signed)
Progress Note   Patient: Barry Burgess ZOX:[id number] DOB: 1926-06-30 DOA: 08/12/2023     4 DOS: the patient was seen and examined on 08/17/2023   Brief hospital course:  89yo with h/o BPH, HTN, HLD, and afib who presented on 8/22 for AMS and generalized weakness/fatigue.  Hypothermic and hypotensive on presentation with concern for dehydration.  UA suggestive of UTI, treated with ceftriaxone.    Assessment and Plan:  Acute metabolic encephalopathy Likely multifactorial, dehydration, UTI, electrolyte abnormalities CT head with no acute intracranial abnormality UA consistent with UTI but culture negative No respiratory symptoms but CXR with left lobe infiltrate. Completed 5 days of ceftriaxone, will stop abx Appears to be back to baseline   Sepsis from UTI/CAP? Physiology noted on admission with hypothermia, bradycardia, hypotension, lactic acid 1.0, hypoxia requiring 2 L supplemental oxygen and source of infection. Patient placed on Bair hugger's, temperature improved Empirically started on ceftriaxone Sepsis physiology has resolved   Acute kidney injury Likely due to dehydration Resolved with IV hydration  BPH Continue finasteride, doxazosin   Hyperlipidemia Continue pravastatin   Atrial fibrillation Heart rate is well-controlled despite no rate-controlling medications.  Continue Eliquis.  Elevated liver enzymes Could be reactive.  Continue to monitor LFTs   Thrombocytopenia Continue to monitor platelet count.   Chronic LE swelling Patient has chronic lower extremity changes with mild erythema, likely stasis dermatitis  Ambulatory dysfunction He reported that primary symptom on presentation was fatigue, ambulatory dysfunction Now that acute issues have resolved, he is nearing transition time PT/OT are recommending SNF He would prefer to remain hospitalized until he is strong enough to return home but is reluctantly willing to agree to placement in a private room  for rehab       Consultants: PT/OT TOC team  Procedures: None  Antibiotics: Ceftriaxone 8/22-27   30 Day Unplanned Readmission Risk Score    Flowsheet Row ED to Hosp-Admission (Current) from [facility]/12/2023 [facility]  30 Day Unplanned Readmission Risk Score (%) 15.69 Filed at 08/17/2023 0801       This score is the patient's risk of an unplanned readmission within 30 days of being discharged (0 -100%). The score is based on dignosis, age, lab data, medications, orders, and past utilization.   Low:  0-14.9   Medium: 15-21.9   High: 22-29.9   Extreme: 30 and above           Subjective: Feeling ok.  Doesn't want to leave the hospital, prefers here over rehab.  No current complaints other than still feeling weak.   Objective: Vitals:   08/17/23 0538 08/17/23 1313  BP: 129/68 121/67  Pulse: (!) 59 64  Resp: 17 20  Temp: (!) 97.4 F (36.3 C) (!) 97.5 F (36.4 C)  SpO2: 98% 98%    Intake/Output Summary (Last 24 hours) at 08/17/2023 1709 Last data filed at 08/17/2023 1441 Gross per 24 hour  Intake 1222.37 ml  Output 400 ml  Net 822.37 ml   Filed Weights   08/12/23 2223 08/13/23 0935  Weight: 87.5 kg 81.7 kg    Exam:  General:  Appears calm and comfortable and is in NAD Eyes:  R exotropia, normal lids, iris ENT:  grossly normal hearing, lips & tongue, mmm Neck:  no LAD, masses or thyromegaly Cardiovascular:  Irregularly irregular without tachycardia, no m/r/g. 2-3+ LE edema.  Respiratory:   CTA bilaterally with no wheezes/rales/rhonchi.  Normal respiratory effort. Abdomen:  soft, NT, ND Skin:  B stasis dermatitis Musculoskeletal:  grossly normal tone BUE/BLE, good ROM, no bony abnormality Psychiatric: blunted mood and affect, speech fluent and appropriate Neurologic:  CN 2-12 grossly intact, moves all extremities in coordinated fashion  Data Reviewed: I have reviewed the patient's lab results since admission.   Pertinent labs for today include:  None today     Family Communication: Family present just prior to my evaluation, unable to reach by phone later in the day  Disposition: Status is: Inpatient Remains inpatient appropriate because: unsafe disposition  Planned Discharge Destination: Skilled nursing facility    Time spent: 50 minutes  Author: Jonah Blue, MD 08/17/2023 5:09 PM  For on call review [email or web address].

## 2023-08-18 DIAGNOSIS — N390 Urinary tract infection, site not specified: Secondary | ICD-10-CM | POA: Diagnosis not present

## 2023-08-18 DIAGNOSIS — A419 Sepsis, unspecified organism: Secondary | ICD-10-CM | POA: Diagnosis not present

## 2023-08-18 LAB — CBC WITH DIFFERENTIAL/PLATELET
Abs Immature Granulocytes: 0.03 10*3/uL (ref 0.00–0.07)
Basophils Absolute: 0 10*3/uL (ref 0.0–0.1)
Basophils Relative: 1 %
Eosinophils Absolute: 0.1 10*3/uL (ref 0.0–0.5)
Eosinophils Relative: 3 %
HCT: 34.4 % — ABNORMAL LOW (ref 39.0–52.0)
Hemoglobin: 10.9 g/dL — ABNORMAL LOW (ref 13.0–17.0)
Immature Granulocytes: 1 %
Lymphocytes Relative: 17 %
Lymphs Abs: 0.7 10*3/uL (ref 0.7–4.0)
MCH: 30.6 pg (ref 26.0–34.0)
MCHC: 31.7 g/dL (ref 30.0–36.0)
MCV: 96.6 fL (ref 80.0–100.0)
Monocytes Absolute: 0.5 10*3/uL (ref 0.1–1.0)
Monocytes Relative: 11 %
Neutro Abs: 2.8 10*3/uL (ref 1.7–7.7)
Neutrophils Relative %: 67 %
Platelets: 110 10*3/uL — ABNORMAL LOW (ref 150–400)
RBC: 3.56 MIL/uL — ABNORMAL LOW (ref 4.22–5.81)
RDW: 15.9 % — ABNORMAL HIGH (ref 11.5–15.5)
WBC: 4.1 10*3/uL (ref 4.0–10.5)
nRBC: 0 % (ref 0.0–0.2)

## 2023-08-18 LAB — BASIC METABOLIC PANEL
Anion gap: 6 (ref 5–15)
BUN: 32 mg/dL — ABNORMAL HIGH (ref 8–23)
CO2: 25 mmol/L (ref 22–32)
Calcium: 8.8 mg/dL — ABNORMAL LOW (ref 8.9–10.3)
Chloride: 112 mmol/L — ABNORMAL HIGH (ref 98–111)
Creatinine, Ser: 1.17 mg/dL (ref 0.61–1.24)
GFR, Estimated: 58 mL/min — ABNORMAL LOW (ref >60)
Glucose, Bld: 103 mg/dL — ABNORMAL HIGH (ref 70–99)
Potassium: 4 mmol/L (ref 3.5–5.1)
Sodium: 143 mmol/L (ref 135–145)

## 2023-08-18 LAB — CULTURE, BLOOD (ROUTINE X 2)
Culture: NO GROWTH
Culture: NO GROWTH
Special Requests: ADEQUATE

## 2023-08-18 NOTE — Progress Notes (Signed)
Physical Therapy Treatment Patient Details Name: Barry Burgess MRN: [id number] DOB: January 30, 1926 Today's Date: 08/18/2023   History of Present Illness Patient is a 89 year old gentleman admitted with weakness, fatigue & AMS. Dx of UTI, AKI. Past medical history of BPH, HLD, HTN, PAF    PT Comments  Significant progress with mobility today, pt ambulated 110' with RW, no loss of balance, HR 107. Son and daughter in law present and stated they can manage pt's care at home. PT now recommending HHPT.      If plan is discharge home, recommend the following: A little help with walking and/or transfers;A little help with bathing/dressing/bathroom;Assistance with cooking/housework;Assist for transportation;Help with stairs or ramp for entrance   Can travel by private vehicle     Yes  Equipment Recommendations  None recommended by PT    Recommendations for Other Services       Precautions / Restrictions Precautions Precautions: Fall Restrictions Weight Bearing Restrictions: No     Mobility  Bed Mobility Overal bed mobility: Modified Independent Bed Mobility: Supine to Sit     Supine to sit: Used rails, HOB elevated, Modified independent (Device/Increase time)          Transfers Overall transfer level: Needs assistance Equipment used: Rolling walker (2 wheels) Transfers: Sit to/from Stand Sit to Stand: Supervision           General transfer comment: VCs hand placement    Ambulation/Gait Ambulation/Gait assistance: Supervision Gait Distance (Feet): 110 Feet Assistive device: Rolling walker (2 wheels) Gait Pattern/deviations: Step-through pattern, Decreased stride length, Trunk flexed Gait velocity: WFL     General Gait Details: steady, no loss of balance   Stairs             Wheelchair Mobility     Tilt Bed    Modified Rankin (Stroke Patients Only)       Balance Overall balance assessment: Needs assistance Sitting-balance support: No upper  extremity supported, Feet unsupported Sitting balance-Leahy Scale: Good     Standing balance support: Bilateral upper extremity supported, During functional activity, Reliant on assistive device for balance Standing balance-Leahy Scale: Poor                              Cognition Arousal: Alert Behavior During Therapy: WFL for tasks assessed/performed Overall Cognitive Status: Within Functional Limits for tasks assessed Area of Impairment: Safety/judgement                                        Exercises General Exercises - Lower Extremity Ankle Circles/Pumps: AROM, Both, 10 reps, Supine    General Comments        Pertinent Vitals/Pain Pain Assessment Pain Assessment: No/denies pain Pain Score: 0-No pain Faces Pain Scale: No hurt    Home Living                          Prior Function            PT Goals (current goals can now be found in the care plan section) Acute Rehab PT Goals Patient Stated Goal: to get back to walking PT Goal Formulation: With patient/family Time For Goal Achievement: 08/27/23 Potential to Achieve Goals: Good Progress towards PT goals: Progressing toward goals    Frequency    Min 1X/week  PT Plan      Co-evaluation              AM-PAC PT "6 Clicks" Mobility   Outcome Measure  Help needed turning from your back to your side while in a flat bed without using bedrails?: None Help needed moving from lying on your back to sitting on the side of a flat bed without using bedrails?: A Little Help needed moving to and from a bed to a chair (including a wheelchair)?: A Little Help needed standing up from a chair using your arms (e.g., wheelchair or bedside chair)?: A Little Help needed to walk in hospital room?: A Little Help needed climbing 3-5 steps with a railing? : A Little 6 Click Score: 19    End of Session Equipment Utilized During Treatment: Gait belt Activity Tolerance: Patient  tolerated treatment well Patient left: with family/visitor present;in chair;with call bell/phone within reach;with chair alarm set Nurse Communication: Mobility status PT Visit Diagnosis: Adult, failure to thrive (R62.7);Muscle weakness (generalized) (M62.81);Difficulty in walking, not elsewhere classified (R26.2)     Time: 2993-7169 PT Time Calculation (min) (ACUTE ONLY): 16 min  Charges:    $Gait Training: 8-22 mins PT General Charges $$ ACUTE PT VISIT: 1 Visit                     Tamala Ser PT 08/18/2023  Acute Rehabilitation Services  Office [phone]

## 2023-08-18 NOTE — Plan of Care (Signed)

## 2023-08-18 NOTE — TOC Progression Note (Signed)
Transition of Care [facility]) - Progression Note    Patient Details  Name: DURWARD GARCIALOPEZ MRN: [id number] Date of Birth: 1926/03/25  [facility]) CM/SW Contact  Otelia Santee, LCSW Phone Number: 08/18/2023, 10:26 AM  Clinical Narrative:    CSW attempted to reach pt's son to review bed offers for SNF. Left voicemail and sent text to son's phone. Awaiting response.    Expected Discharge Plan: Skilled Nursing Facility Barriers to Discharge: No Barriers Identified  Expected Discharge Plan and Services In-house Referral: NA   Post Acute Care Choice: Home Health (waiting on PT eval) Living arrangements for the past 2 months: Single Family Home                   DME Agency: NA       HH Arranged: NA           Social Determinants of Health (SDOH) Interventions SDOH Screenings   Food Insecurity: No Food Insecurity (08/13/2023)  Housing: Low Risk  (08/13/2023)  Transportation Needs: No Transportation Needs (08/13/2023)  Utilities: Not At Risk (08/13/2023)  Alcohol Screen: Low Risk  (05/14/2022)  Financial Resource Strain: Low Risk  (05/14/2022)  Tobacco Use: Medium Risk (08/12/2023)    Readmission Risk Interventions     No data to display

## 2023-08-18 NOTE — Progress Notes (Signed)
OT Cancellation Note  Patient Details Name: Barry Burgess MRN: [id number] DOB: Aug 26, 1926   Cancelled Treatment:    Reason Eval/Treat Not Completed: Other (comment) Patient dressed in recliner awaiting son to be able to d/c home. OT to defer to next level of care.  Rosalio Loud, MS Acute Rehabilitation Department Office# [phone]  08/18/2023, 2:24 PM

## 2023-08-18 NOTE — Discharge Summary (Signed)
Physician Discharge Summary   Patient: Barry Burgess MRN: [id number] DOB: [birth year]  Admit date:     08/12/2023  Discharge date: 08/18/23  Discharge Physician: Jonah Blue   PCP: [name], MD   Recommendations at discharge:   Your infection has been treated; no additional antibiotics are necessary. Home health physical therapy ordered Resume Lasix and other home medications Follow up with PCP in 1-2 weeks  Discharge Diagnoses: Principal Problem:   Sepsis secondary to UTI [facility]) Active Problems:   HTN (hypertension)   HLD (hyperlipidemia)   BPH (benign prostatic hyperplasia)   Paroxysmal atrial fibrillation (HCC)   Acute metabolic encephalopathy   Ambulatory dysfunction    Hospital Course: 89yo with h/o BPH, HTN, HLD, and afib who presented on 8/22 for AMS and generalized weakness/fatigue. Hypothermic and hypotensive on presentation with concern for dehydration. UA suggestive of UTI, treated with ceftriaxone.   Assessment and Plan:  Acute metabolic encephalopathy Likely multifactorial, dehydration, UTI, electrolyte abnormalities CT head with no acute intracranial abnormality UA consistent with UTI but culture negative No respiratory symptoms but CXR with left lobe infiltrate. Completed 5 days of ceftriaxone Appears to be back to baseline   Sepsis from UTI/CAP? Physiology noted on admission with hypothermia, bradycardia, hypotension, lactic acid 1.0, hypoxia requiring 2 L supplemental oxygen and source of infection. Patient placed on Bair hugger's, temperature improved Empirically treated with ceftriaxone x 5 days Sepsis physiology has resolved   Acute kidney injury Likely due to dehydration Resolved with IV hydration   BPH Continue finasteride, doxazosin   Hyperlipidemia Continue pravastatin   Atrial fibrillation Heart rate is well-controlled despite no rate-controlling medications.  Continue Eliquis.   Elevated liver enzymes Could be reactive.   Continue to monitor LFTs   Thrombocytopenia Continue to monitor platelet count.   Chronic LE swelling Patient has chronic lower extremity changes with mild erythema, likely stasis dermatitis Resume Lasix  Glaucoma  Continue Cosopt, latanoprost  Buttocks pressure ulcer, POA Pressure Injury 08/13/23 Buttocks Right Unstageable - Full thickness tissue loss in which the base of the injury is covered by slough (yellow, tan, gray, green or brown) and/or eschar (tan, brown or black) in the wound bed. 1cm x 2 cm (Active)  08/13/23 1000  Location: Buttocks  Location Orientation: Right  Staging: Unstageable - Full thickness tissue loss in which the base of the injury is covered by slough (yellow, tan, gray, green or brown) and/or eschar (tan, brown or black) in the wound bed.  Wound Description (Comments): 1cm x 2 cm  Present on Admission: Yes  Keep pressure ulcer clean and dry, cover with protective dressing   Ambulatory dysfunction He reported that primary symptom on presentation was fatigue, ambulatory dysfunction Now that acute issues have resolved, he is nearing transition time PT/OT initially recommended SNF He has improved and is now recommended for home health  DNR Confirmed on admission          Consultants: PT/OT TOC team   Procedures: None   Antibiotics: Ceftriaxone 8/22-27       Pain control - [facility] Controlled Substance Reporting System database was reviewed. and patient was instructed, not to drive, operate heavy machinery, perform activities at heights, swimming or participation in water activities or provide baby-sitting services while on Pain, Sleep and Anxiety Medications; until their outpatient Physician has advised to do so again. Also recommended to not to take more than prescribed Pain, Sleep and Anxiety Medications.    Disposition: Home Diet recommendation:  Cardiac diet DISCHARGE MEDICATION:  Allergies as of 08/18/2023   No Known Allergies       Medication List     STOP taking these medications    amoxicillin 500 MG tablet Commonly known as: AMOXIL       TAKE these medications    dorzolamide-timolol 2-0.5 % ophthalmic solution Commonly known as: COSOPT Place 1 drop into both eyes 2 (two) times daily.   doxazosin 4 MG tablet Commonly known as: CARDURA Take 4 mg by mouth daily.   Eliquis 5 MG Tabs tablet Generic drug: apixaban TAKE 1 TABLET BY MOUTH TWICE  DAILY   finasteride 5 MG tablet Commonly known as: PROSCAR Take 5 mg by mouth daily.   furosemide 40 MG tablet Commonly known as: LASIX Take 1 tablet (40 mg total) by mouth 2 (two) times daily.   latanoprost 0.005 % ophthalmic solution Commonly known as: XALATAN Place 1 drop into both eyes every morning.   pravastatin 80 MG tablet Commonly known as: PRAVACHOL Take 40 mg by mouth daily.               Discharge Care Instructions  (From admission, onward)           Start     Ordered   08/18/23 0000  Discharge wound care:       Comments: Keep pressure ulcer clean and dry with protective dressing   08/18/23 1432            Follow-up Information     [facility] Follow up on 08/20/2023.   Why: [facility] will follow up with you at [facility] to provide home health physical and occupational therapy.               Discharge Exam: Filed Weights   08/12/23 2223 08/13/23 0935  Weight: 87.5 kg 81.7 kg     Subjective: No complaints, still fatigued, family wants to take him home today.     Objective: Vitals:   08/18/23 0530 08/18/23 1019  BP: 116/66 126/70  Pulse: 85   Resp: 16   Temp: 97.9 F (36.6 C)   SpO2: 99%     Intake/Output Summary (Last 24 hours) at 08/18/2023 1432 Last data filed at 08/18/2023 1057 Gross per 24 hour  Intake 1223.99 ml  Output 800 ml  Net 423.99 ml   Filed Weights   08/12/23 2223 08/13/23 0935  Weight: 87.5 kg 81.7 kg    Exam:  General:  Appears calm and comfortable and is in  NAD Eyes:  R exotropia, normal lids, iris ENT:  grossly normal hearing, lips & tongue, mmm Neck:  no LAD, masses or thyromegaly Cardiovascular:  Irregularly irregular without tachycardia, no m/r/g. 2-3+ LE edema.  Respiratory:   CTA bilaterally with no wheezes/rales/rhonchi.  Normal respiratory effort. Abdomen:  soft, NT, ND Skin:  B stasis dermatitis Musculoskeletal:  grossly normal tone BUE/BLE, good ROM, no bony abnormality Psychiatric: blunted mood and affect, speech fluent and appropriate Neurologic:  CN 2-12 grossly intact, moves all extremities in coordinated fashion  Data Reviewed: I have reviewed the patient's lab results since admission.  Pertinent labs for today include:  Unremarkable BMP Stable CBC     Condition at discharge: improving  The results of significant diagnostics from this hospitalization (including imaging, microbiology, ancillary and laboratory) are listed below for reference.   Imaging Studies: DG Chest 2 View  Result Date: 08/17/2023 CLINICAL DATA:  Follow-up pneumonia. Shortness of breath and weakness. EXAM: CHEST - 2 VIEW COMPARISON:  08/12/2023. FINDINGS: Examination  is limited due to patient related factors. Redemonstration of left retrocardiac airspace opacity obscuring the left hemidiaphragm, descending thoracic aorta and blunting the left lateral costophrenic angle suggesting combination of left lower lobe atelectasis and/or consolidation with pleural effusion. No significant interval change. Bilateral lung fields are otherwise clear. There is small right pleural effusion, slightly increased since the prior study. Stable cardio-mediastinal silhouette. No acute osseous abnormalities. The soft tissues are within normal limits. IMPRESSION: 1. No significant interval change in left lower lobe atelectasis and/or consolidation with pleural effusion. 2. Small right pleural effusion, slightly increased since the prior study. Electronically Signed   By: Jules Schick M.D.   On: 08/17/2023 11:44   CT Head Wo Contrast  Result Date: 08/12/2023 CLINICAL DATA:  Mental status change, unknown cause EXAM: CT HEAD WITHOUT CONTRAST TECHNIQUE: Contiguous axial images were obtained from the base of the skull through the vertex without intravenous contrast. RADIATION DOSE REDUCTION: This exam was performed according to the departmental dose-optimization program which includes automated exposure control, adjustment of the mA and/or kV according to patient size and/or use of iterative reconstruction technique. COMPARISON:  None Available. FINDINGS: Brain: Patchy chronic small vessel disease throughout the deep white matter. No acute intracranial abnormality. Specifically, no hemorrhage, hydrocephalus, mass lesion, acute infarction, or significant intracranial injury. Vascular: No hyperdense vessel or unexpected calcification. Skull: No acute calvarial abnormality. Sinuses/Orbits: No acute findings Other: None IMPRESSION: No acute intracranial abnormality. Patchy chronic small vessel disease. Electronically Signed   By: Charlett Nose M.D.   On: 08/12/2023 23:33   DG Chest Port 1 View  Result Date: 08/12/2023 CLINICAL DATA:  Questionable sepsis EXAM: PORTABLE CHEST 1 VIEW COMPARISON:  Chest x-ray 05/11/2022 FINDINGS: Large hiatal hernia is again seen. The heart is enlarged, unchanged. There are small bilateral pleural effusions, left greater than right. There some infiltrates in the left lung base. Pneumothorax or acute fracture. IMPRESSION: 1. Cardiomegaly. 2. Small bilateral pleural effusions, left greater than right. 3. Infiltrate in the left lung base. 4. Large hiatal hernia. Electronically Signed   By: Darliss Cheney M.D.   On: 08/12/2023 23:28    Microbiology: Results for orders placed or performed during the hospital encounter of 08/12/23  Blood Culture (routine x 2)     Status: None   Collection Time: 08/12/23 10:28 PM   Specimen: BLOOD  Result Value Ref Range Status    Specimen Description   Final    BLOOD BLOOD RIGHT FOREARM Performed at [facility], [address], [place], [place] [address]    Special Requests   Final    BOTTLES DRAWN AEROBIC AND ANAEROBIC Blood Culture results may not be optimal due to an excessive volume of blood received in culture bottles Performed at [facility], [address] [place], [place] [address]    Culture   Final    NO GROWTH 5 DAYS Performed at [facility], [address], [place], [place] [address]    Report Status 08/18/2023 FINAL  Final  Blood Culture (routine x 2)     Status: None   Collection Time: 08/12/23 10:37 PM   Specimen: BLOOD  Result Value Ref Range Status   Specimen Description   Final    BLOOD BLOOD RIGHT FOREARM Performed at [facility], [address], [place], [place] [address]    Special Requests   Final    BOTTLES DRAWN AEROBIC AND ANAEROBIC Blood Culture adequate volume Performed at [facility], [address], [place], [place] [address]    Culture   Final    NO GROWTH 5 DAYS Performed at [facility], [address], [place], [place] [address]    Report Status 08/18/2023 FINAL  Final  Urine Culture     Status: Abnormal   Collection Time: 08/13/23 12:24 AM   Specimen: Urine, Clean Catch  Result Value Ref Range Status   Specimen Description   Final    URINE, CLEAN CATCH Performed at [facility], [address], [place], [place] [address]    Special Requests   Final    NONE Performed at [facility], [address] [place], [place] [address]    Culture MULTIPLE SPECIES PRESENT, SUGGEST RECOLLECTION (A)  Final   Report Status 08/14/2023 FINAL  Final  MRSA Next Gen by PCR, Nasal     Status: None   Collection Time: 08/13/23 10:05 AM   Specimen: Nasal Mucosa; Nasal Swab  Result Value Ref Range Status   MRSA by PCR Next Gen NOT DETECTED NOT DETECTED  Final    Comment: (NOTE) The GeneXpert MRSA Assay (FDA approved for NASAL specimens only), is one component of a comprehensive MRSA colonization surveillance program. It is not intended to diagnose MRSA infection nor to guide or monitor treatment for MRSA infections. Test performance is not FDA approved in patients less than 56 years old. Performed at [facility], [address] [place], [place] [address]     Labs: CBC: Recent Labs  Lab 08/12/23 2228 08/13/23 0432 08/15/23 0249 08/16/23 0307 08/18/23 0906  WBC 5.1 4.7 4.2 3.8* 4.1  NEUTROABS 3.8 3.5  --   --  2.8  HGB 12.1* 10.7* 11.0* 10.9* 10.9*  HCT 37.7* 32.1* 34.6* 34.0* 34.4*  MCV 93.5 93.0 95.3 97.1 96.6  PLT 114* 107* 101* 103* 110*   Basic Metabolic Panel: Recent Labs  Lab 08/12/23 2228 08/13/23 0432 08/13/23 0538 08/15/23 0249 08/16/23 0304 08/18/23 0906  NA 134*  --  136 138 139 143  K 3.9  --  3.9 3.9 3.8 4.0  CL 96*  --  99 105 109 112*  CO2 28  --  28 24 25 25   GLUCOSE 134*  --  127* 98 104* 103*  BUN 28*  --  26* 35* 34* 32*  CREATININE 1.26* 1.17 1.15 1.30* 1.32* 1.17  CALCIUM 8.8*  --  8.6* 8.4* 8.3* 8.8*  MG  --   --   --  2.1  --   --   PHOS  --   --   --  3.5  --   --    Liver Function Tests: Recent Labs  Lab 08/12/23 2228 08/13/23 0538 08/15/23 0249  AST 88* 66* 46*  ALT 68* 57* 50*  ALKPHOS 77 62 60  BILITOT 0.5 0.5 0.8  PROT 7.1 6.0* 5.8*  ALBUMIN 3.8 3.2* 3.0*   CBG: No results for input(s): "GLUCAP" in the last 168 hours.  Discharge time spent: greater than 30 minutes.  Signed: Jonah Blue, MD [facility] 08/18/2023

## 2023-08-18 NOTE — Hospital Course (Signed)
89yo with h/o BPH, HTN, HLD, and afib who presented on 8/22 for AMS and generalized weakness/fatigue. Hypothermic and hypotensive on presentation with concern for dehydration. UA suggestive of UTI, treated with ceftriaxone.

## 2023-08-18 NOTE — Care Management Important Message (Signed)
Important Message  Patient Details IM Letter given. Name: Barry Burgess MRN: [id number] Date of Birth: [birth year]   Medicare Important Message Given:  Yes     Caren Macadam 08/18/2023, 11:54 AM

## 2023-08-18 NOTE — TOC Transition Note (Signed)
Transition of Care [facility]) - CM/SW Discharge Note   Patient Details  Name: Barry Burgess MRN: [id number] Date of Birth: 05/10/1926  [facility]) CM/SW Contact:  Otelia Santee, LCSW Phone Number: 08/18/2023, 2:01 PM   Clinical Narrative:    Met with pt and son at bedside. Pt's son declines SNF placement and states he will take pt home at discharge. Pt's son agreeable to having HHPT/OT arranged. HH has been arranged with [facility] who will have first visit with pt on Friday. Pt's son shares they have several RW at home and decline further DME needs.  Pt's son to transport pt home.   Final next level of care: Home w Home Health Services Barriers to Discharge: No Barriers Identified   Patient Goals and CMS Choice CMS Medicare.gov Compare Post Acute Care list provided to:: Patient Represenative (must comment)    Discharge Placement                         Discharge Plan and Services Additional resources added to the After Visit Summary for   In-house Referral: NA   Post Acute Care Choice: Home Health (waiting on PT eval)          DME Arranged: N/A DME Agency: NA       HH Arranged: PT, OT HH Agency: [facility] Date HH Agency Contacted: 08/18/23 Time HH Agency Contacted: 1401 Representative spoke with at [facility] Agency: Marylene Land  Social Determinants of Health (SDOH) Interventions SDOH Screenings   Food Insecurity: No Food Insecurity (08/13/2023)  Housing: Low Risk  (08/13/2023)  Transportation Needs: No Transportation Needs (08/13/2023)  Utilities: Not At Risk (08/13/2023)  Alcohol Screen: Low Risk  (05/14/2022)  Financial Resource Strain: Low Risk  (05/14/2022)  Tobacco Use: Medium Risk (08/12/2023)     Readmission Risk Interventions     No data to display

## 2023-08-21 ENCOUNTER — Encounter (HOSPITAL_COMMUNITY): Payer: Self-pay | Admitting: Radiology

## 2023-08-21 ENCOUNTER — Inpatient Hospital Stay (HOSPITAL_COMMUNITY)
Admission: EM | Admit: 2023-08-21 | Discharge: 2023-08-25 | DRG: 871 | Disposition: A | Discharge status: Home Health | Payer: Medicare Other | Attending: Internal Medicine | Admitting: Internal Medicine

## 2023-08-21 ENCOUNTER — Emergency Department (HOSPITAL_COMMUNITY): Payer: Medicare Other

## 2023-08-21 ENCOUNTER — Other Ambulatory Visit: Payer: Self-pay

## 2023-08-21 ENCOUNTER — Observation Stay (HOSPITAL_COMMUNITY): Payer: Medicare Other

## 2023-08-21 DIAGNOSIS — Z87440 Personal history of urinary (tract) infections: Secondary | ICD-10-CM

## 2023-08-21 DIAGNOSIS — R531 Weakness: Secondary | ICD-10-CM | POA: Diagnosis not present

## 2023-08-21 DIAGNOSIS — N400 Enlarged prostate without lower urinary tract symptoms: Secondary | ICD-10-CM | POA: Diagnosis not present

## 2023-08-21 DIAGNOSIS — I4819 Other persistent atrial fibrillation: Secondary | ICD-10-CM | POA: Diagnosis present

## 2023-08-21 DIAGNOSIS — Z87891 Personal history of nicotine dependence: Secondary | ICD-10-CM | POA: Diagnosis not present

## 2023-08-21 DIAGNOSIS — R680 Hypothermia, not associated with low environmental temperature: Secondary | ICD-10-CM | POA: Diagnosis present

## 2023-08-21 DIAGNOSIS — R251 Tremor, unspecified: Secondary | ICD-10-CM | POA: Diagnosis not present

## 2023-08-21 DIAGNOSIS — Z6825 Body mass index (BMI) 25.0-25.9, adult: Secondary | ICD-10-CM

## 2023-08-21 DIAGNOSIS — I110 Hypertensive heart disease with heart failure: Secondary | ICD-10-CM | POA: Diagnosis present

## 2023-08-21 DIAGNOSIS — R6520 Severe sepsis without septic shock: Secondary | ICD-10-CM | POA: Diagnosis present

## 2023-08-21 DIAGNOSIS — T68XXXA Hypothermia, initial encounter: Secondary | ICD-10-CM | POA: Diagnosis not present

## 2023-08-21 DIAGNOSIS — R6510 Systemic inflammatory response syndrome (SIRS) of non-infectious origin without acute organ dysfunction: Secondary | ICD-10-CM | POA: Diagnosis not present

## 2023-08-21 DIAGNOSIS — I5032 Chronic diastolic (congestive) heart failure: Secondary | ICD-10-CM | POA: Diagnosis present

## 2023-08-21 DIAGNOSIS — I480 Paroxysmal atrial fibrillation: Secondary | ICD-10-CM | POA: Diagnosis present

## 2023-08-21 DIAGNOSIS — Z79899 Other long term (current) drug therapy: Secondary | ICD-10-CM

## 2023-08-21 DIAGNOSIS — I10 Essential (primary) hypertension: Secondary | ICD-10-CM | POA: Diagnosis not present

## 2023-08-21 DIAGNOSIS — I083 Combined rheumatic disorders of mitral, aortic and tricuspid valves: Secondary | ICD-10-CM | POA: Diagnosis not present

## 2023-08-21 DIAGNOSIS — J180 Bronchopneumonia, unspecified organism: Secondary | ICD-10-CM | POA: Diagnosis not present

## 2023-08-21 DIAGNOSIS — J90 Pleural effusion, not elsewhere classified: Secondary | ICD-10-CM | POA: Diagnosis not present

## 2023-08-21 DIAGNOSIS — Z743 Need for continuous supervision: Secondary | ICD-10-CM | POA: Diagnosis not present

## 2023-08-21 DIAGNOSIS — I700 Atherosclerosis of aorta: Secondary | ICD-10-CM | POA: Diagnosis present

## 2023-08-21 DIAGNOSIS — I517 Cardiomegaly: Secondary | ICD-10-CM | POA: Diagnosis not present

## 2023-08-21 DIAGNOSIS — I5022 Chronic systolic (congestive) heart failure: Secondary | ICD-10-CM | POA: Diagnosis present

## 2023-08-21 DIAGNOSIS — I499 Cardiac arrhythmia, unspecified: Secondary | ICD-10-CM | POA: Diagnosis not present

## 2023-08-21 DIAGNOSIS — H409 Unspecified glaucoma: Secondary | ICD-10-CM | POA: Diagnosis not present

## 2023-08-21 DIAGNOSIS — A419 Sepsis, unspecified organism: Principal | ICD-10-CM | POA: Diagnosis present

## 2023-08-21 DIAGNOSIS — J9811 Atelectasis: Secondary | ICD-10-CM | POA: Diagnosis present

## 2023-08-21 DIAGNOSIS — Z66 Do not resuscitate: Secondary | ICD-10-CM | POA: Diagnosis present

## 2023-08-21 DIAGNOSIS — K449 Diaphragmatic hernia without obstruction or gangrene: Secondary | ICD-10-CM | POA: Diagnosis present

## 2023-08-21 DIAGNOSIS — E538 Deficiency of other specified B group vitamins: Secondary | ICD-10-CM | POA: Diagnosis present

## 2023-08-21 DIAGNOSIS — R5383 Other fatigue: Secondary | ICD-10-CM | POA: Diagnosis not present

## 2023-08-21 DIAGNOSIS — Z1152 Encounter for screening for COVID-19: Secondary | ICD-10-CM | POA: Diagnosis not present

## 2023-08-21 DIAGNOSIS — Z8701 Personal history of pneumonia (recurrent): Secondary | ICD-10-CM

## 2023-08-21 DIAGNOSIS — Z7901 Long term (current) use of anticoagulants: Secondary | ICD-10-CM

## 2023-08-21 DIAGNOSIS — R262 Difficulty in walking, not elsewhere classified: Secondary | ICD-10-CM | POA: Diagnosis not present

## 2023-08-21 DIAGNOSIS — K5900 Constipation, unspecified: Secondary | ICD-10-CM | POA: Diagnosis not present

## 2023-08-21 DIAGNOSIS — E785 Hyperlipidemia, unspecified: Secondary | ICD-10-CM | POA: Diagnosis present

## 2023-08-21 DIAGNOSIS — R918 Other nonspecific abnormal finding of lung field: Secondary | ICD-10-CM | POA: Diagnosis not present

## 2023-08-21 DIAGNOSIS — R630 Anorexia: Secondary | ICD-10-CM | POA: Diagnosis present

## 2023-08-21 LAB — URINALYSIS, W/ REFLEX TO CULTURE (INFECTION SUSPECTED)
Bilirubin Urine: NEGATIVE
Glucose, UA: NEGATIVE mg/dL
Ketones, ur: NEGATIVE mg/dL
Leukocytes,Ua: NEGATIVE
Nitrite: NEGATIVE
Protein, ur: NEGATIVE mg/dL
Specific Gravity, Urine: 1.005 (ref 1.005–1.030)
pH: 5 (ref 5.0–8.0)

## 2023-08-21 LAB — COMPREHENSIVE METABOLIC PANEL
ALT: 47 U/L — ABNORMAL HIGH (ref 0–44)
AST: 49 U/L — ABNORMAL HIGH (ref 15–41)
Albumin: 3.5 g/dL (ref 3.5–5.0)
Alkaline Phosphatase: 66 U/L (ref 38–126)
Anion gap: 9 (ref 5–15)
BUN: 23 mg/dL (ref 8–23)
CO2: 26 mmol/L (ref 22–32)
Calcium: 8.5 mg/dL — ABNORMAL LOW (ref 8.9–10.3)
Chloride: 102 mmol/L (ref 98–111)
Creatinine, Ser: 0.98 mg/dL (ref 0.61–1.24)
GFR, Estimated: >60 mL/min (ref >60)
Glucose, Bld: 120 mg/dL — ABNORMAL HIGH (ref 70–99)
Potassium: 3.8 mmol/L (ref 3.5–5.1)
Sodium: 137 mmol/L (ref 135–145)
Total Bilirubin: 0.4 mg/dL (ref 0.3–1.2)
Total Protein: 6.2 g/dL — ABNORMAL LOW (ref 6.5–8.1)

## 2023-08-21 LAB — CBC WITH DIFFERENTIAL/PLATELET
Abs Immature Granulocytes: 0.02 10*3/uL (ref 0.00–0.07)
Basophils Absolute: 0 10*3/uL (ref 0.0–0.1)
Basophils Relative: 0 %
Eosinophils Absolute: 0.1 10*3/uL (ref 0.0–0.5)
Eosinophils Relative: 2 %
HCT: 33.6 % — ABNORMAL LOW (ref 39.0–52.0)
Hemoglobin: 10.9 g/dL — ABNORMAL LOW (ref 13.0–17.0)
Immature Granulocytes: 1 %
Lymphocytes Relative: 13 %
Lymphs Abs: 0.5 10*3/uL — ABNORMAL LOW (ref 0.7–4.0)
MCH: 30.5 pg (ref 26.0–34.0)
MCHC: 32.4 g/dL (ref 30.0–36.0)
MCV: 94.1 fL (ref 80.0–100.0)
Monocytes Absolute: 0.3 10*3/uL (ref 0.1–1.0)
Monocytes Relative: 7 %
Neutro Abs: 2.8 10*3/uL (ref 1.7–7.7)
Neutrophils Relative %: 77 %
Platelets: 122 10*3/uL — ABNORMAL LOW (ref 150–400)
RBC: 3.57 MIL/uL — ABNORMAL LOW (ref 4.22–5.81)
RDW: 15.7 % — ABNORMAL HIGH (ref 11.5–15.5)
WBC: 3.7 10*3/uL — ABNORMAL LOW (ref 4.0–10.5)
nRBC: 0 % (ref 0.0–0.2)

## 2023-08-21 LAB — APTT: aPTT: 37 seconds — ABNORMAL HIGH (ref 24–36)

## 2023-08-21 LAB — RESP PANEL BY RT-PCR (RSV, FLU A&B, COVID)  RVPGX2
Influenza A by PCR: NEGATIVE
Influenza B by PCR: NEGATIVE
Resp Syncytial Virus by PCR: NEGATIVE
SARS Coronavirus 2 by RT PCR: NEGATIVE

## 2023-08-21 LAB — PROTIME-INR
INR: 1.3 — ABNORMAL HIGH (ref 0.8–1.2)
Prothrombin Time: 16.7 seconds — ABNORMAL HIGH (ref 11.4–15.2)

## 2023-08-21 LAB — I-STAT CG4 LACTIC ACID, ED: Lactic Acid, Venous: 1 mmol/L (ref 0.5–1.9)

## 2023-08-21 MED ORDER — LACTATED RINGERS IV SOLN: INTRAVENOUS | Status: DC

## 2023-08-21 MED ORDER — SODIUM CHLORIDE 0.9 % IV SOLN
2.0000 g | INTRAVENOUS | Status: DC
  Administered 2023-08-21: 2 g via INTRAVENOUS
  Filled 2023-08-21: qty 20

## 2023-08-21 MED ORDER — MIDODRINE HCL 5 MG PO TABS
5.0000 mg | ORAL_TABLET | Freq: Three times a day (TID) | ORAL | Status: DC
  Administered 2023-08-22 – 2023-08-25 (×10): 5 mg via ORAL
  Filled 2023-08-21 (×10): qty 1

## 2023-08-21 NOTE — ED Triage Notes (Signed)
Not feeling better from being here a few days ago. Continues to be fatigued and weak.  125/69 71 20 96% RA CBG 113 20 L forearm

## 2023-08-21 NOTE — Subjective & Objective (Signed)
Pt presents fatigued and weak needing bair hugger Was recently admitted for UTI/ CAP and Sepsis discharged on 08/18/2023 Did not feel much better During prior admit also was noted hypotensive and hypothermic

## 2023-08-21 NOTE — ED Provider Notes (Signed)
  [facility] EMERGENCY DEPARTMENT AT [name] Provider Note   CSN: [id number] Arrival date & time: 08/21/23  1739     History {Add pertinent medical, surgical, social history, OB history to HPI:1} Chief Complaint  Patient presents with   Fatigue    Barry Burgess is a 89 y.o. male.  HPI     Home Medications Prior to Admission medications   Medication Sig Start Date End Date Taking? Authorizing Provider  dorzolamide-timolol (COSOPT) 22.3-6.8 MG/ML ophthalmic solution Place 1 drop into both eyes 2 (two) times daily. 10/07/15   [provider]  doxazosin (CARDURA) 4 MG tablet Take 4 mg by mouth daily. 10/11/15   [provider]  ELIQUIS 5 MG TABS tablet TAKE 1 TABLET BY MOUTH TWICE  DAILY 04/26/23   Wendall Stade, MD  finasteride (PROSCAR) 5 MG tablet Take 5 mg by mouth daily. 08/14/15   [provider]  furosemide (LASIX) 40 MG tablet Take 1 tablet (40 mg total) by mouth 2 (two) times daily. 08/04/23   Wendall Stade, MD  latanoprost (XALATAN) 0.005 % ophthalmic solution Place 1 drop into both eyes every morning. 10/07/15   [provider]  pravastatin (PRAVACHOL) 80 MG tablet Take 40 mg by mouth daily.  11/25/15   [provider]      Allergies    Patient has no known allergies.    Review of Systems   Review of Systems  Physical Exam Updated Vital Signs BP 126/78   Pulse (!) 50   Temp (!) 92.3 F (33.5 C) (Rectal)   Resp 14   SpO2 97%  Physical Exam  ED Results / Procedures / Treatments   Labs (all labs ordered are listed, but only abnormal results are displayed) Labs Reviewed - No data to display  EKG None  Radiology No results found.  Procedures Procedures  {Document cardiac monitor, telemetry assessment procedure when appropriate:1}  Medications Ordered in ED Medications - No data to display  ED Course/ Medical Decision Making/ A&P   {   Click here for ABCD2, HEART and other  calculatorsREFRESH Note before signing :1}                              Medical Decision Making Amount and/or Complexity of Data Reviewed Labs: ordered. Radiology: ordered. ECG/medicine tests: ordered.  Risk Prescription drug management. Decision regarding hospitalization.   Consult: Dr. Felipa Furnace for admission.  {Document critical care time when appropriate:1} {Document review of labs and clinical decision tools ie heart score, Chads2Vasc2 etc:1}  {Document your independent review of radiology images, and any outside records:1} {Document your discussion with family members, caretakers, and with consultants:1} {Document social determinants of health affecting pt's care:1} {Document your decision making why or why not admission, treatments were needed:1} Final Clinical Impression(s) / ED Diagnoses Final diagnoses:  None    Rx / DC Orders ED Discharge Orders     None

## 2023-08-21 NOTE — Assessment & Plan Note (Signed)
Hold proscar given hypotension

## 2023-08-21 NOTE — Assessment & Plan Note (Signed)
Allow permissive HTN  

## 2023-08-21 NOTE — Sepsis Progress Note (Signed)
Elink following code sepsis °

## 2023-08-21 NOTE — H&P (Signed)
Barry Burgess WRU:[id number] DOB: 11/21/1926 DOA: 08/21/2023     PCP: [name], MD   Outpatient Specialists: * NONE CARDS: * Dr. Charlton Haws, MD  NEphrology: *  Dr. No care team member to display  NEurology *   Dr. Pulmonary *  Dr.  Oncology * Dr.No care team member to display  GI* Dr.  Deboraha Sprang, [facility]) No care team member to display Urology Dr. *  Patient arrived to ER on 08/21/23 at 1739 Referred by Attending Arby Barrette, MD   Patient coming from:    home Lives alone,   *** With family From facility ***    Chief Complaint:   Chief Complaint  Patient presents with   Fatigue    HPI: Barry Burgess is a 89 y.o. male with medical history significant of BPH, HTN, HLD, and afib, sepsis    Presented with  fatigue Pt presents fatigued and weak needing bair hugger Was recently admitted for UTI/ CAP and Sepsis discharged on 08/18/2023 Did not feel much better During prior admit also was noted hypotensive and hypothermic    Denies significant ETOH intake *** Does not smoke*** but interested in quitting***  Lab Results  Component Value Date   SARSCOV2NAA NEGATIVE 08/21/2023    Regarding pertinent Chronic problems:    Hyperlipidemia -  on statins Pravachol (pravastatin)  Lipid Panel     Component Value Date/Time   CHOL 93 10/15/2015 0214   TRIG 27 10/15/2015 0214   HDL 53 10/15/2015 0214   CHOLHDL 1.8 10/15/2015 0214   VLDL 5 10/15/2015 0214   LDLCALC 35 10/15/2015 0214     HTN on Cardura   chronic CHF diastolic/systolic/ combined - last echo  Recent Results (from the past 91478 hour(s))  ECHOCARDIOGRAM COMPLETE   Collection Time: 05/12/22  2:50 PM  Result Value   Weight 3,440   BP 108/55   Single Plane A4C EF 31.7   S' Lateral 3.30   AR max vel 2.02   AV Area VTI 2.17   AV Mean grad 2.0   AV Peak grad 5.3   Ao pk vel 1.15   P 1/2 time 1,164   Area-P 1/2 4.89   AV Area mean vel 1.94   Narrative      ECHOCARDIOGRAM REPORT        IMPRESSIONS    1. Left ventricular ejection fraction, by estimation, is 50 to 55%. The left ventricle has low normal function. The left ventricle demonstrates global hypokinesis. Left ventricular diastolic parameters are indeterminate.  2. Right ventricular systolic function is normal. The right ventricular size is severely enlarged. Mildly increased right ventricular wall thickness.  3. Left atrial size was severely dilated.  4. Right atrial size was severely dilated.  5. The mitral valve is grossly normal. No evidence of mitral valve regurgitation. No evidence of mitral stenosis.  6. Tricuspid valve regurgitation is mild to moderate and atrial functional in nature.  7. The aortic valve is tricuspid. There is moderate calcification of the aortic valve. There is mild thickening of the aortic valve. Aortic valve regurgitation is trivial. Aortic valve sclerosis is present, with no evidence of aortic valve stenosis.  Comparison(s): Unable to access 2018 study, further atrial dilation from prior report.         *** CAD  - On Aspirin, statin, betablocker, Plavix                 - *followed by cardiology                -  last cardiac cath         Hypothyroidism?:   Lab Results  Component Value Date   TSH 7.937 (H) 08/12/2023  Not on synthroid     *** Asthma -well *** controlled on home inhalers/ nebs                     *** COPD - not **followed by pulmonology *** not  on baseline oxygen  *L,    A. Fib -   atrial fibrillation CHA2DS2 vas score   5    On eliquis    BPH - on Flomax, Proscar     Chronic anemia - baseline hg Hemoglobin & Hematocrit  Recent Labs    08/16/23 0307 08/18/23 0906 08/21/23 2015  HGB 10.9* 10.9* 10.9*     While in ER:     Lab Orders         Resp panel by RT-PCR (RSV, Flu A&B, Covid) Anterior Nasal Swab         Blood Culture (routine x 2)         Comprehensive metabolic panel         CBC with Differential         Protime-INR         APTT          Urinalysis, w/ Reflex to Culture (Infection Suspected) -Urine, Clean Catch         I-Stat Lactic Acid, ED      CT HEAD *** NON acute    CXR - Small right pleural effusion with atelectasis or infiltrate     Following Medications were ordered in ER: Medications  lactated ringers infusion ( Intravenous New Bag/Given 08/21/23 2022)  cefTRIAXone (ROCEPHIN) 2 g in sodium chloride 0.9 % 100 mL IVPB (0 g Intravenous Stopped 08/21/23 2134)       ED Triage Vitals  Encounter Vitals Group     BP 08/21/23 1823 126/78     Systolic BP Percentile --      Diastolic BP Percentile --      Pulse Rate 08/21/23 1823 (!) 50     Resp 08/21/23 1823 14     Temp 08/21/23 1920 (!) 92.3 F (33.5 C)     Temp Source 08/21/23 1920 Rectal     SpO2 08/21/23 1823 97 %     Weight --      Height --      Head Circumference --      Peak Flow --      Pain Score --      Pain Loc --      Pain Education --      Exclude from Growth Chart --   QVZD(63)@     _________________________________________ Significant initial  Findings: Abnormal Labs Reviewed  COMPREHENSIVE METABOLIC PANEL - Abnormal; Notable for the following components:      Result Value   Glucose, Bld 120 (*)    Calcium 8.5 (*)    Total Protein 6.2 (*)    AST 49 (*)    ALT 47 (*)    All other components within normal limits  CBC WITH DIFFERENTIAL/PLATELET - Abnormal; Notable for the following components:   WBC 3.7 (*)    RBC 3.57 (*)    Hemoglobin 10.9 (*)    HCT 33.6 (*)    RDW 15.7 (*)    Platelets 122 (*)    Lymphs Abs 0.5 (*)    All other components within normal limits  PROTIME-INR - Abnormal; Notable for the following components:   Prothrombin Time 16.7 (*)    INR 1.3 (*)    All other components within normal limits  APTT - Abnormal; Notable for the following components:   aPTT 37 (*)    All other components within normal limits  URINALYSIS, W/ REFLEX TO CULTURE (INFECTION SUSPECTED) - Abnormal; Notable for the following  components:   Color, Urine STRAW (*)    Hgb urine dipstick MODERATE (*)    Bacteria, UA RARE (*)    All other components within normal limits      _________________________ Troponin ***ordered Cardiac Panel (last 3 results) No results for input(s): "CKTOTAL", "CKMB", "TROPONINIHS", "RELINDX" in the last 72 hours.   ECG: Ordered Personally reviewed and interpreted by me showing: HR : *** Rhythm: *NSR, Sinus tachycardia * A.fib. W RVR, RBBB, LBBB, Paced Ischemic changes*nonspecific changes, no evidence of ischemic changes QTC*  BNP (last 3 results) Recent Labs    08/12/23 2228  BNP 217.0*     COVID-19 Labs    Lab Results  Component Value Date   SARSCOV2NAA NEGATIVE 08/21/2023   ____________ This patient meets SIRS Criteria and may be septic.    The recent clinical data is shown below. Vitals:   08/21/23 2139 08/21/23 2200 08/21/23 2230 08/21/23 2250  BP:  (!) 96/59 (!) 89/61 100/64  Pulse:  62 (!) 46 72  Resp:  11 11 13   Temp: (!) 93.6 F (34.2 C)     TempSrc: Rectal     SpO2:  94% 94% 96%    WBC     Component Value Date/Time   WBC 3.7 (L) 08/21/2023 2015   LYMPHSABS 0.5 (L) 08/21/2023 2015   MONOABS 0.3 08/21/2023 2015   EOSABS 0.1 08/21/2023 2015   BASOSABS 0.0 08/21/2023 2015    Lactic Acid, Venous    Component Value Date/Time   LATICACIDVEN 1.0 08/21/2023 2028    Procalcitonin   Ordered      UA hematuria   Urine analysis:    Component Value Date/Time   COLORURINE STRAW (A) 08/21/2023 1953   APPEARANCEUR CLEAR 08/21/2023 1953   LABSPEC 1.005 08/21/2023 1953   PHURINE 5.0 08/21/2023 1953   GLUCOSEU NEGATIVE 08/21/2023 1953   HGBUR MODERATE (A) 08/21/2023 1953   BILIRUBINUR NEGATIVE 08/21/2023 1953   KETONESUR NEGATIVE 08/21/2023 1953   PROTEINUR NEGATIVE 08/21/2023 1953   NITRITE NEGATIVE 08/21/2023 1953   LEUKOCYTESUR NEGATIVE 08/21/2023 1953    Results for orders placed or performed during the hospital encounter of 08/21/23  Resp  panel by RT-PCR (RSV, Flu A&B, Covid) Anterior Nasal Swab     Status: None   Collection Time: 08/21/23  8:30 PM   Specimen: Anterior Nasal Swab  Result Value Ref Range Status   SARS Coronavirus 2 by RT PCR NEGATIVE NEGATIVE Final         Influenza A by PCR NEGATIVE NEGATIVE Final   Influenza B by PCR NEGATIVE NEGATIVE Final         Resp Syncytial Virus by PCR NEGATIVE NEGATIVE Final          ABX started Antibiotics Given (last 72 hours)     Date/Time Action Medication Dose Rate   08/21/23 2024 New Bag/Given   cefTRIAXone (ROCEPHIN) 2 g in sodium chloride 0.9 % 100 mL IVPB 2 g 200 mL/hr        ______  ***Venous  Blood Gas result:  pH *** pCO2 ***; pO2 ***;     %  O2 Sat ***.     __________________________________________________________ Recent Labs  Lab 08/15/23 0249 08/16/23 0304 08/18/23 0906 08/21/23 2015  NA 138 139 143 137  K 3.9 3.8 4.0 3.8  CO2 24 25 25 26   GLUCOSE 98 104* 103* 120*  BUN 35* 34* 32* 23  CREATININE 1.30* 1.32* 1.17 0.98  CALCIUM 8.4* 8.3* 8.8* 8.5*  MG 2.1  --   --   --   PHOS 3.5  --   --   --     Cr    stable,    Lab Results  Component Value Date   CREATININE 0.98 08/21/2023   CREATININE 1.17 08/18/2023   CREATININE 1.32 (H) 08/16/2023    Recent Labs  Lab 08/15/23 0249 08/21/23 2015  AST 46* 49*  ALT 50* 47*  ALKPHOS 60 66  BILITOT 0.8 0.4  PROT 5.8* 6.2*  ALBUMIN 3.0* 3.5   Lab Results  Component Value Date   CALCIUM 8.5 (L) 08/21/2023   PHOS 3.5 08/15/2023    Plt: Lab Results  Component Value Date   PLT 122 (L) 08/21/2023    Recent Labs  Lab 08/15/23 0249 08/16/23 0307 08/18/23 0906 08/21/23 2015  WBC 4.2 3.8* 4.1 3.7*  NEUTROABS  --   --  2.8 2.8  HGB 11.0* 10.9* 10.9* 10.9*  HCT 34.6* 34.0* 34.4* 33.6*  MCV 95.3 97.1 96.6 94.1  PLT 101* 103* 110* 122*    HG/HCT   stable,     Component Value Date/Time   HGB 10.9 (L) 08/21/2023 2015   HGB 14.5 06/17/2023 0933   HCT 33.6 (L) 08/21/2023 2015   HCT 45.1  06/17/2023 0933   MCV 94.1 08/21/2023 2015   MCV 95 06/17/2023 0933      _______________________________________________ Hospitalist was called for admission for fatigue,  Hypothermia   The following Work up has been ordered so far:  Orders Placed This Encounter  Procedures   Resp panel by RT-PCR (RSV, Flu A&B, Covid) Anterior Nasal Swab   Blood Culture (routine x 2)   DG Chest Port 1 View   Comprehensive metabolic panel   CBC with Differential   Protime-INR   APTT   Urinalysis, w/ Reflex to Culture (Infection Suspected) -Urine, Clean Catch   Document height and weight   Assess and Document Glasgow Coma Scale   Document vital signs within 1-hour of fluid bolus completion. Notify provider of abnormal vital signs despite fluid resuscitation.   DO NOT delay antibiotics if unable to obtain blood culture.   Refer to Sidebar Report: Sepsis Sidebar ED/IP   Notify provider for difficulties obtaining IV access.   Insert peripheral IV x 2   Initiate Carrier Fluid Protocol   Code Sepsis activation.  This occurs automatically when order is signed and prioritizes pharmacy, lab, and radiology services for STAT collections and interventions.  If [facility] downtime, call [facility] [phone]) to activate Code Sepsis.   monitoring by pharmacy   Consult to hospitalist   I-Stat Lactic Acid, ED   ED EKG 12-Lead     Cultures:    Component Value Date/Time   SDES  08/13/2023 0024    URINE, CLEAN CATCH Performed at [facility], [address], [place], [place] [address]    SPECREQUEST  08/13/2023 0024    NONE Performed at [facility], [address], [place], [place] [address]    CULT MULTIPLE SPECIES PRESENT, SUGGEST RECOLLECTION (A) 08/13/2023 0024   REPTSTATUS 08/14/2023 FINAL 08/13/2023 0024  Radiological Exams on Admission: DG Chest Port 1 View  Result Date: 08/21/2023 CLINICAL DATA:  Possible sepsis.  Continued weakness and fatigue. EXAM:  PORTABLE CHEST 1 VIEW COMPARISON:  08/17/2023. FINDINGS: Heart is enlarged and the mediastinal contour stable. A density containing air is noted in the retrocardiac space on the right, suggesting hiatal hernia. There is atherosclerotic calcification of the aorta. Patchy airspace disease is present at the right lung base. The left lung base is excluded from the field of view. There is a small right pleural effusion. No pneumothorax is seen. No acute osseous abnormality. IMPRESSION: 1. Small right pleural effusion with atelectasis or infiltrate. 2. Left lung base is excluded from the field of view. 3. Cardiomegaly. 4. Hiatal hernia. Electronically Signed   By: Thornell Sartorius M.D.   On: 08/21/2023 21:40   _______________________________________________________________________________________________________ Latest  Blood pressure 100/64, pulse 72, temperature (!) 93.6 F (34.2 C), temperature source Rectal, resp. rate 13, SpO2 96%.   Vitals  labs and radiology finding personally reviewed  Review of Systems:    Pertinent positives include:  fatigue,  Constitutional:  No weight loss, night sweats, Fevers, chills,  weight loss  HEENT:  No headaches, Difficulty swallowing,Tooth/dental problems,Sore throat,  No sneezing, itching, ear ache, nasal congestion, post nasal drip,  Cardio-vascular:  No chest pain, Orthopnea, PND, anasarca, dizziness, palpitations.no Bilateral lower extremity swelling  GI:  No heartburn, indigestion, abdominal pain, nausea, vomiting, diarrhea, change in bowel habits, loss of appetite, melena, blood in stool, hematemesis Resp:  no shortness of breath at rest. No dyspnea on exertion, No excess mucus, no productive cough, No non-productive cough, No coughing up of blood.No change in color of mucus.No wheezing. Skin:  no rash or lesions. No jaundice GU:  no dysuria, change in color of urine, no urgency or frequency. No straining to urinate.  No flank pain.  Musculoskeletal:   No joint pain or no joint swelling. No decreased range of motion. No back pain.  Psych:  No change in mood or affect. No depression or anxiety. No memory loss.  Neuro: no localizing neurological complaints, no tingling, no weakness, no double vision, no gait abnormality, no slurred speech, no confusion  All systems reviewed and apart from HOPI all are negative _______________________________________________________________________________________________ Past Medical History:   Past Medical History:  Diagnosis Date   Aortic insufficiency    Atrial fibrillation with RVR (HCC) 10/14/2015   BPH (benign prostatic hyperplasia)    Glaucoma    Hyperlipidemia    Hypertension    Mitral regurgitation    PAF (paroxysmal atrial fibrillation) (HCC)    Tricuspid regurgitation      Past Surgical History:  Procedure Laterality Date   CARDIOVERSION N/A 11/13/2015   Procedure: CARDIOVERSION;  Surgeon: Wendall Stade, MD;  Location: [facility] ENDOSCOPY;  Service: Cardiovascular;  Laterality: N/A;   EYE SURGERY     bilateral cataracts   PILONIDAL CYST / SINUS EXCISION      Social History:  Ambulatory *** independently cane, walker  wheelchair bound, bed bound     reports that he quit smoking about 60 years ago. His smoking use included pipe and cigars. He has never used smokeless tobacco. He reports that he does not currently use alcohol. He reports that he does not use drugs.    Family History:   Family History  Problem Relation Age of Onset   Stroke Father 78   ______________________________________________________________________________________________ Allergies: No Known Allergies   Prior to Admission medications   Medication Sig Start Date  End Date Taking? Authorizing Provider  dorzolamide-timolol (COSOPT) 22.3-6.8 MG/ML ophthalmic solution Place 1 drop into both eyes 2 (two) times daily. 10/07/15  Yes [provider]  doxazosin (CARDURA) 4 MG tablet Take 4 mg by mouth  daily. 10/11/15  Yes [provider]  ELIQUIS 5 MG TABS tablet TAKE 1 TABLET BY MOUTH TWICE  DAILY 04/26/23  Yes Wendall Stade, MD  finasteride (PROSCAR) 5 MG tablet Take 5 mg by mouth daily. 08/14/15  Yes [provider]  furosemide (LASIX) 40 MG tablet Take 1 tablet (40 mg total) by mouth 2 (two) times daily. 08/04/23  Yes Wendall Stade, MD  latanoprost (XALATAN) 0.005 % ophthalmic solution Place 1 drop into both eyes every evening. 10/07/15  Yes [provider]  pravastatin (PRAVACHOL) 40 MG tablet Take 40 mg by mouth daily.   Yes [provider]    ___________________________________________________________________________________________________ Physical Exam:    08/21/2023   10:50 PM 08/21/2023   10:30 PM 08/21/2023   10:00 PM  Vitals with BMI  Systolic 100 89 96  Diastolic 64 61 59  Pulse 72 46 62      1. General:  in No  Acute distress   Chronically ill -appearing 2. Psychological: Alert and   Oriented 3. Head/ENT:    Dry Mucous Membranes                          Head Non traumatic, neck supple                           Poor Dentition 4. SKIN:  decreased Skin turgor,  Skin clean Dry and intact no rash    5. Heart: Regular rate and rhythm no*** Murmur, no Rub or gallop 6. Lungs: ***Clear to auscultation bilaterally, no wheezes or crackles   7. Abdomen: Soft, ***non-tender, Non distended *** obese ***bowel sounds present 8. Lower extremities: no clubbing, cyanosis, no ***edema 9. Neurologically Grossly intact, moving all 4 extremities equally *** strength 5 out of 5 in all 4 extremities cranial nerves II through XII intact 10. MSK: Normal range of motion    Chart has been reviewed  ______________________________________________________________________________________________  Assessment/Plan 89 y.o. male with medical history significant of BPH, HTN, HLD, and afib, sepsis  Admitted for  Hypothermia, initial encounter     Present on  Admission:  HTN (hypertension)  HLD (hyperlipidemia)  Paroxysmal atrial fibrillation (HCC)  BPH (benign prostatic hyperplasia)     HTN (hypertension) Allow permissive HTN   HLD (hyperlipidemia) Hold statin given elevated LFT's  Paroxysmal atrial fibrillation (HCC) Has been off AC due to risks of fall HR controlled    BPH (benign prostatic hyperplasia) Hold proscar given hypotension    Other plan as per orders.  DVT prophylaxis:  SCD     Code Status:  DNR/DNI ***comfort care as per patient ***family  I had personally discussed CODE STATUS with patient and family*  ACP *** none has been reviewed ***   Family Communication:   Family not at  Bedside  plan of care was discussed on the phone with *** Son, Daughter, Wife, Husband, Sister, Brother , father, mother  Diet    Disposition Plan:   *** likely will need placement for rehabilitation                          Back to current facility when stable  To home once workup is complete and patient is stable  ***Following barriers for discharge:                             Chest pain *** Stroke *** work up is complete                            Electrolytes corrected                               Anemia corrected h/H stable                             Pain controlled with PO medications                               Afebrile, white count improving able to transition to PO antibiotics                             Will need to be able to tolerate PO                            Will likely need home health, home O2, set up                           Will need consultants to evaluate patient prior to discharge       Consult Orders  (From admission, onward)           Start     Ordered   08/21/23 2248  Consult to hospitalist  Once       Provider:  (Not yet assigned)  Question Answer Comment  Place call to: [facility]   Reason for Consult Admit      08/21/23 2247                               ***Would benefit from PT/OT eval prior to DC  Ordered                   Swallow eval - SLP ordered                   Diabetes care coordinator                   Transition of care consulted                   Nutrition    consulted                  Wound care  consulted                   Palliative care    consulted                   Behavioral health  consulted                    Consults called: ***     Admission status:  ED Disposition     ED Disposition  Admit   Condition  --   Comment  The patient appears reasonably stabilized for admission considering the current resources, flow, and capabilities available in the ED at this time, and I doubt any other [facility] requiring further screening and/or treatment in the ED prior to admission is  present.           Obs***  ***  inpatient     I Expect 2 midnight stay secondary to severity of patient's current illness need for inpatient interventions justified by the following: ***hemodynamic instability despite optimal treatment (tachycardia *hypotension * tachypnea *hypoxia, hypercapnia) * Severe lab/radiological/exam abnormalities including:     and extensive comorbidities including: *substance abuse  *Chronic pain *DM2  * CHF * CAD  * COPD/asthma *Morbid Obesity * CKD *dementia *liver disease *history of stroke with residual deficits *  malignancy, * sickle cell disease  History of amputation Chronic anticoagulation  That are currently affecting medical management.   I expect  patient to be hospitalized for 2 midnights requiring inpatient medical care.  Patient is at high risk for adverse outcome (such as loss of life or disability) if not treated.  Indication for inpatient stay as follows:  Severe change from baseline regarding mental status Hemodynamic instability despite maximal medical therapy,  ongoing suicidal ideations,  severe pain requiring acute inpatient management,  inability to  maintain oral hydration   persistent chest pain despite medical management Need for operative/procedural  intervention New or worsening hypoxia   Need for IV antibiotics, IV fluids, IV rate controling medications, IV antihypertensives, IV pain medications, IV anticoagulation, need for biPAP    Level of care   *** tele  For 12H 24H     medical floor       progressive     stepdown   tele indefinitely please discontinue once patient no longer qualifies COVID-19 Labs    Lab Results  Component Value Date   SARSCOV2NAA NEGATIVE 08/21/2023     Precautions: admitted as *** Covid Negative  ***asymptomatic screening protocol****PUI *** covid positive No active isolations ***If Covid PCR is negative  - please DC precautions - would need additional investigation given very high risk for false native test result    Critical***  Patient is critically ill due to  hemodynamic instability * respiratory failure *severe sepsis* ongoing chest pain*  They are at high risk for life/limb threatening clinical deterioration requiring frequent reassessment and modifications of care.  Services provided include examination of the patient, review of relevant ancillary tests, prescription of lifesaving therapies, review of medications and prophylactic therapy.  Total critical care time excluding separately billable procedures: 60*  Minutes.    Therisa Doyne 08/21/2023, 10:57 PM ***  [facility]     after 2 AM please page floor coverage PA If 7AM-7PM, please contact the day team taking care of the patient using [email or web address]

## 2023-08-21 NOTE — ED Notes (Signed)
Pt. Placed on bairhugger

## 2023-08-21 NOTE — Assessment & Plan Note (Signed)
Hold statin given elevated LFT's

## 2023-08-21 NOTE — Assessment & Plan Note (Signed)
Initially hypotensive and hypothermic  Fluids given  Appears to have mild Periferal edema and persistent left pleural effusion   Would need ot be judicious with fluids  Will start midodrine Repeat echo

## 2023-08-21 NOTE — Assessment & Plan Note (Signed)
Recurrent events.  Noted TSH which was slightly elevated.  May benefit from initiation of Synthroid will check T4 T3, Check cortisol level

## 2023-08-21 NOTE — Assessment & Plan Note (Addendum)
Eliquis  Given hematuria and anemia will nee dot have long term discussions  Regarding risk vs benefit HR controlled

## 2023-08-22 ENCOUNTER — Observation Stay (HOSPITAL_COMMUNITY): Payer: Medicare Other

## 2023-08-22 DIAGNOSIS — K449 Diaphragmatic hernia without obstruction or gangrene: Secondary | ICD-10-CM | POA: Diagnosis not present

## 2023-08-22 DIAGNOSIS — R918 Other nonspecific abnormal finding of lung field: Secondary | ICD-10-CM | POA: Diagnosis not present

## 2023-08-22 DIAGNOSIS — J9811 Atelectasis: Secondary | ICD-10-CM | POA: Diagnosis not present

## 2023-08-22 DIAGNOSIS — I517 Cardiomegaly: Secondary | ICD-10-CM

## 2023-08-22 DIAGNOSIS — R6510 Systemic inflammatory response syndrome (SIRS) of non-infectious origin without acute organ dysfunction: Secondary | ICD-10-CM | POA: Diagnosis not present

## 2023-08-22 DIAGNOSIS — J90 Pleural effusion, not elsewhere classified: Secondary | ICD-10-CM | POA: Diagnosis not present

## 2023-08-22 LAB — VITAMIN B12: Vitamin B-12: 495 pg/mL (ref 180–914)

## 2023-08-22 LAB — CBC
HCT: 29.8 % — ABNORMAL LOW (ref 39.0–52.0)
Hemoglobin: 9.7 g/dL — ABNORMAL LOW (ref 13.0–17.0)
MCH: 30.3 pg (ref 26.0–34.0)
MCHC: 32.6 g/dL (ref 30.0–36.0)
MCV: 93.1 fL (ref 80.0–100.0)
Platelets: 112 10*3/uL — ABNORMAL LOW (ref 150–400)
RBC: 3.2 MIL/uL — ABNORMAL LOW (ref 4.22–5.81)
RDW: 15.9 % — ABNORMAL HIGH (ref 11.5–15.5)
WBC: 3.8 10*3/uL — ABNORMAL LOW (ref 4.0–10.5)
nRBC: 0 % (ref 0.0–0.2)

## 2023-08-22 LAB — COMPREHENSIVE METABOLIC PANEL
ALT: 44 U/L (ref 0–44)
AST: 46 U/L — ABNORMAL HIGH (ref 15–41)
Albumin: 3 g/dL — ABNORMAL LOW (ref 3.5–5.0)
Alkaline Phosphatase: 60 U/L (ref 38–126)
Anion gap: 9 (ref 5–15)
BUN: 21 mg/dL (ref 8–23)
CO2: 26 mmol/L (ref 22–32)
Calcium: 8.3 mg/dL — ABNORMAL LOW (ref 8.9–10.3)
Chloride: 104 mmol/L (ref 98–111)
Creatinine, Ser: 1.01 mg/dL (ref 0.61–1.24)
GFR, Estimated: >60 mL/min (ref >60)
Glucose, Bld: 85 mg/dL (ref 70–99)
Potassium: 3.9 mmol/L (ref 3.5–5.1)
Sodium: 139 mmol/L (ref 135–145)
Total Bilirubin: 0.7 mg/dL (ref 0.3–1.2)
Total Protein: 5.6 g/dL — ABNORMAL LOW (ref 6.5–8.1)

## 2023-08-22 LAB — ECHOCARDIOGRAM COMPLETE
AR max vel: 1.71 cm2
AV Area VTI: 1.63 cm2
AV Area mean vel: 1.69 cm2
AV Mean grad: 3 mmHg
AV Peak grad: 4.8 mmHg
Ao pk vel: 1.09 m/s
Area-P 1/2: 5.02 cm2
S' Lateral: 2.8 cm

## 2023-08-22 LAB — RETICULOCYTES
Immature Retic Fract: 11.2 % (ref 2.3–15.9)
RBC.: 3.2 MIL/uL — ABNORMAL LOW (ref 4.22–5.81)
Retic Count, Absolute: 58.2 10*3/uL (ref 19.0–186.0)
Retic Ct Pct: 1.8 % (ref 0.4–3.1)

## 2023-08-22 LAB — BRAIN NATRIURETIC PEPTIDE: B Natriuretic Peptide: 301.9 pg/mL — ABNORMAL HIGH (ref 0.0–100.0)

## 2023-08-22 LAB — CK: Total CK: 68 U/L (ref 49–397)

## 2023-08-22 LAB — AMMONIA: Ammonia: 24 umol/L (ref 9–35)

## 2023-08-22 LAB — BLOOD GAS, VENOUS
Acid-Base Excess: 3.8 mmol/L — ABNORMAL HIGH (ref 0.0–2.0)
Bicarbonate: 29.2 mmol/L — ABNORMAL HIGH (ref 20.0–28.0)
O2 Saturation: 65.3 %
Patient temperature: 37
pCO2, Ven: 46 mmHg (ref 44–60)
pH, Ven: 7.41 (ref 7.25–7.43)
pO2, Ven: 35 mmHg (ref 32–45)

## 2023-08-22 LAB — HEPATITIS PANEL, ACUTE
HCV Ab: NONREACTIVE
Hep A IgM: NONREACTIVE
Hep B C IgM: NONREACTIVE
Hepatitis B Surface Ag: NONREACTIVE

## 2023-08-22 LAB — TROPONIN I (HIGH SENSITIVITY)
Troponin I (High Sensitivity): 16 ng/L (ref <18)
Troponin I (High Sensitivity): 19 ng/L — ABNORMAL HIGH (ref <18)
Troponin I (High Sensitivity): 20 ng/L — ABNORMAL HIGH (ref <18)

## 2023-08-22 LAB — IRON AND TIBC
Iron: 43 ug/dL — ABNORMAL LOW (ref 45–182)
Saturation Ratios: 14 % — ABNORMAL LOW (ref 17.9–39.5)
TIBC: 307 ug/dL (ref 250–450)
UIBC: 264 ug/dL

## 2023-08-22 LAB — MAGNESIUM
Magnesium: 1.7 mg/dL (ref 1.7–2.4)
Magnesium: 1.8 mg/dL (ref 1.7–2.4)

## 2023-08-22 LAB — PROCALCITONIN: Procalcitonin: <0.1 ng/mL

## 2023-08-22 LAB — T4, FREE: Free T4: 1.11 ng/dL (ref 0.61–1.12)

## 2023-08-22 LAB — FOLATE: Folate: 4.2 ng/mL — ABNORMAL LOW (ref >5.9)

## 2023-08-22 LAB — PHOSPHORUS
Phosphorus: 3.4 mg/dL (ref 2.5–4.6)
Phosphorus: 3.5 mg/dL (ref 2.5–4.6)

## 2023-08-22 LAB — CORTISOL: Cortisol, Plasma: 15 ug/dL

## 2023-08-22 LAB — FERRITIN: Ferritin: 47 ng/mL (ref 24–336)

## 2023-08-22 LAB — TSH: TSH: 5.14 u[IU]/mL — ABNORMAL HIGH (ref 0.350–4.500)

## 2023-08-22 LAB — PREALBUMIN: Prealbumin: 15 mg/dL — ABNORMAL LOW (ref 18–38)

## 2023-08-22 LAB — MRSA NEXT GEN BY PCR, NASAL: MRSA by PCR Next Gen: NOT DETECTED

## 2023-08-22 MED ORDER — LACTATED RINGERS IV SOLN: INTRAVENOUS | Status: DC

## 2023-08-22 MED ORDER — HYDROCODONE-ACETAMINOPHEN 5-325 MG PO TABS: 1.0000 | ORAL_TABLET | ORAL | Status: DC | PRN

## 2023-08-22 MED ORDER — ACETAMINOPHEN 325 MG PO TABS: 650.0000 mg | ORAL_TABLET | Freq: Four times a day (QID) | ORAL | Status: DC | PRN

## 2023-08-22 MED ORDER — ACETAMINOPHEN 650 MG RE SUPP: 650.0000 mg | Freq: Four times a day (QID) | RECTAL | Status: DC | PRN

## 2023-08-22 MED ORDER — SODIUM CHLORIDE 0.9 % IV SOLN
2.0000 g | Freq: Two times a day (BID) | INTRAVENOUS | Status: DC
  Administered 2023-08-22 – 2023-08-23 (×3): 2 g via INTRAVENOUS
  Filled 2023-08-22 (×3): qty 12.5

## 2023-08-22 MED ORDER — ONDANSETRON HCL 4 MG/2ML IJ SOLN: 4.0000 mg | Freq: Four times a day (QID) | INTRAMUSCULAR | Status: DC | PRN

## 2023-08-22 MED ORDER — VANCOMYCIN HCL 1250 MG/250ML IV SOLN
1250.0000 mg | INTRAVENOUS | Status: DC
  Administered 2023-08-23: 1250 mg via INTRAVENOUS
  Filled 2023-08-22: qty 250

## 2023-08-22 MED ORDER — ONDANSETRON HCL 4 MG PO TABS: 4.0000 mg | ORAL_TABLET | Freq: Four times a day (QID) | ORAL | Status: DC | PRN

## 2023-08-22 MED ORDER — VANCOMYCIN HCL 1750 MG/350ML IV SOLN
1750.0000 mg | Freq: Once | INTRAVENOUS | Status: AC
  Administered 2023-08-22: 1750 mg via INTRAVENOUS
  Filled 2023-08-22: qty 350

## 2023-08-22 MED ORDER — IOHEXOL 350 MG/ML SOLN
100.0000 mL | Freq: Once | INTRAVENOUS | Status: AC | PRN
  Administered 2023-08-22: 100 mL via INTRAVENOUS

## 2023-08-22 MED ORDER — COSYNTROPIN 0.25 MG IJ SOLR
0.2500 mg | Freq: Once | INTRAMUSCULAR | Status: AC
  Administered 2023-08-23: 0.25 mg via INTRAVENOUS
  Filled 2023-08-22: qty 0.25

## 2023-08-22 MED ORDER — APIXABAN 5 MG PO TABS
5.0000 mg | ORAL_TABLET | Freq: Two times a day (BID) | ORAL | Status: DC
  Administered 2023-08-22 – 2023-08-25 (×7): 5 mg via ORAL
  Filled 2023-08-22 (×7): qty 1

## 2023-08-22 MED ORDER — LATANOPROST 0.005 % OP SOLN
1.0000 [drp] | Freq: Every evening | OPHTHALMIC | Status: DC
  Administered 2023-08-22 – 2023-08-24 (×3): 1 [drp] via OPHTHALMIC
  Filled 2023-08-22: qty 2.5

## 2023-08-22 MED ORDER — CHLORHEXIDINE GLUCONATE CLOTH 2 % EX PADS
6.0000 | MEDICATED_PAD | Freq: Every day | CUTANEOUS | Status: DC
  Administered 2023-08-23 – 2023-08-24 (×3): 6 via TOPICAL

## 2023-08-22 NOTE — Progress Notes (Signed)
       Overnight   NAME: Barry Burgess MRN: [id number] DOB : Sep 01, 1926    Date of Service   08/22/2023   HPI/Events of Note    Notified by Admitting Physician for follow up on Imaging   =================================================== Imaging in part:  IMPRESSION: 1. Negative for acute pulmonary embolism. 2. Pulmonary parenchymal findings suspicious for bronchopneumonia and/or aspiration. 3. Small right-greater-than-left pleural effusions and associated atelectasis. 4. Trace pericholecystic fluid may be related to volume status/CHF. If there is concern for cholecystitis consider right upper quadrant ultrasound. 5. Large hiatal hernia with intrathoracic stomach.   Aortic Atherosclerosis (ICD10-I70.0).     Electronically Signed   By: Minerva Fester M.D.   On: 08/22/2023 02:15   =====================================================   IMPRESSION: 1. Negative for acute pulmonary embolism. 2. Pulmonary parenchymal findings suspicious for bronchopneumonia and/or aspiration. 3. Small right-greater-than-left pleural effusions and associated atelectasis. 4. Trace pericholecystic fluid may be related to volume status/CHF. If there is concern for cholecystitis consider right upper quadrant ultrasound. 5. Large hiatal hernia with intrathoracic stomach.   Aortic Atherosclerosis (ICD10-I70.0).     Electronically Signed   By: Minerva Fester M.D.   On: 08/22/2023 02:15            Interventions/ Plan   Continue orders.      Chinita Greenland BSN MSNA MSN ACNPC-AG Acute Care Nurse Practitioner [facility]

## 2023-08-22 NOTE — Evaluation (Signed)
Physical Therapy Evaluation Patient Details Name: Barry Burgess MRN: [id number] DOB: [birth year] Today's Date: 08/22/2023  History of Present Illness  Patient is a 89 year old male who presented with hypotension, weakness, and fatigue. Patient was admitted with hypothermia. Recent d/c from hospital on 8/28 for UTI, CAP and sepsis. PMH:HLD, BPH, bilateral lower extremity edema, a fib  Clinical Impression  Pt admitted with above diagnosis.  Pt currently with functional limitations due to the deficits listed below (see PT Problem List). Pt will benefit from acute skilled PT to increase their independence and safety with mobility to allow discharge.  Pt quick to state "I can't" but when encouraged he was physically able to perform bed mobility, stand, and ambulate a few feet over to recliner.  Pt with recent admission and HHPT recommended.  Pt seems to require encouragement and repeatedly needed to be reminded to assist himself first (try on his own prior to asking for help).  Recommend HHPT upon d/c if pt agreeable to participate.         If plan is discharge home, recommend the following: A little help with walking and/or transfers;A little help with bathing/dressing/bathroom;Assistance with cooking/housework;Assist for transportation;Help with stairs or ramp for entrance   Can travel by private vehicle        Equipment Recommendations None recommended by PT  Recommendations for Other Services       Functional Status Assessment Patient has had a recent decline in their functional status and demonstrates the ability to make significant improvements in function in a reasonable and predictable amount of time.     Precautions / Restrictions Precautions Precautions: Fall Restrictions Weight Bearing Restrictions: No      Mobility  Bed Mobility Overal bed mobility: Needs Assistance Bed Mobility: Supine to Sit     Supine to sit: Supervision, HOB elevated, Used rails     General bed  mobility comments: patient reported "I can't" but was able to progress to EOB with no physical A with increased time.    Transfers Overall transfer level: Needs assistance Equipment used: Rolling walker (2 wheels) Transfers: Sit to/from Stand Sit to Stand: Contact guard assist           General transfer comment: CGA for safety and lines, no physical assist required    Ambulation/Gait Ambulation/Gait assistance: Contact guard assist Gait Distance (Feet): 5 Feet Assistive device: Rolling walker (2 wheels) Gait Pattern/deviations: Step-through pattern, Decreased stride length, Trunk flexed       General Gait Details: short distance in room from bed to recliner; pt did not appear motivated to perform farther ambulation today but at least OOB in recliner  Stairs            Wheelchair Mobility     Tilt Bed    Modified Rankin (Stroke Patients Only)       Balance Overall balance assessment: Needs assistance         Standing balance support: Bilateral upper extremity supported, During functional activity, Reliant on assistive device for balance Standing balance-Leahy Scale: Poor                               Pertinent Vitals/Pain Pain Assessment Pain Assessment: No/denies pain    Home Living Family/patient expects to be discharged to:: Private residence Living Arrangements: Children Available Help at Discharge: Family;Available 24 hours/day Type of Home: House Home Access: Ramped entrance       Home Layout:  One level Home Equipment: Agricultural consultant (2 wheels);Shower seat;Wheelchair - manual;Toilet riser;Grab bars - tub/shower      Prior Function Prior Level of Function : Needs assist             Mobility Comments: uses RW, no falls in past 6 months ADLs Comments: min assist for for ADLs from son, patient reporting son gets socks on for him at home     Extremity/Trunk Assessment   Upper Extremity Assessment Upper Extremity  Assessment: Overall WFL for tasks assessed (patient has full ROM of BUE with noted edema pooling around elbows bilaterally. this was also present during last treatment as well per notes)    Lower Extremity Assessment Lower Extremity Assessment: Overall WFL for tasks assessed RLE Deficits / Details: edema noted BLEs    Cervical / Trunk Assessment Cervical / Trunk Assessment: Kyphotic  Communication   Communication Communication: Hearing impairment  Cognition Arousal: Alert Behavior During Therapy: WFL for tasks assessed/performed Overall Cognitive Status: Difficult to assess Area of Impairment: Safety/judgement                               General Comments: patients daughter in law's brother is present in room during session. patient HOH impacting cog assessment as well.        General Comments General comments (skin integrity, edema, etc.): preexisting spot on patients R shin anterior was noted to start weeping with standing. nurse in room at this time and made aware.    Exercises     Assessment/Plan    PT Assessment Patient needs continued PT services  PT Problem List Decreased activity tolerance;Decreased strength;Decreased balance;Decreased mobility;Decreased knowledge of use of DME;Decreased skin integrity       PT Treatment Interventions Therapeutic activities;Patient/family education;Gait training;Balance training;DME instruction;Therapeutic exercise;Functional mobility training    PT Goals (Current goals can be found in the Care Plan section)  Acute Rehab PT Goals PT Goal Formulation: With patient Time For Goal Achievement: 09/05/23 Potential to Achieve Goals: Fair    Frequency Min 1X/week     Co-evaluation PT/OT/SLP Co-Evaluation/Treatment: Yes Reason for Co-Treatment: To address functional/ADL transfers;Necessary to address cognition/behavior during functional activity PT goals addressed during session: Mobility/safety with mobility OT goals  addressed during session: ADL's and self-care       AM-PAC PT "6 Clicks" Mobility  Outcome Measure Help needed turning from your back to your side while in a flat bed without using bedrails?: None Help needed moving from lying on your back to sitting on the side of a flat bed without using bedrails?: A Little Help needed moving to and from a bed to a chair (including a wheelchair)?: A Little Help needed standing up from a chair using your arms (e.g., wheelchair or bedside chair)?: A Little Help needed to walk in hospital room?: A Little Help needed climbing 3-5 steps with a railing? : A Lot 6 Click Score: 18    End of Session Equipment Utilized During Treatment: Gait belt Activity Tolerance: Patient tolerated treatment well Patient left: in chair;with call bell/phone within reach;with chair alarm set;with family/visitor present Nurse Communication: Mobility status PT Visit Diagnosis: Adult, failure to thrive (R62.7);Muscle weakness (generalized) (M62.81);Difficulty in walking, not elsewhere classified (R26.2)    Time: 4098-1191 PT Time Calculation (min) (ACUTE ONLY): 26 min   Charges:   PT Evaluation $PT Eval Low Complexity: 1 Low   PT General Charges $$ ACUTE PT VISIT: 1 Visit  Paulino Door, DPT Physical Therapist Acute Rehabilitation Services Office: [phone]   Janan Halter [place] 08/22/2023, 1:37 PM

## 2023-08-22 NOTE — Progress Notes (Signed)
Pharmacy Antibiotic Note  Barry Burgess is a 89 y.o. male admitted on 08/21/2023 with sepsis.  Recent hospitalization for UTI/CAP (discharged 08/18/23). Patient received Ceftriaxone 2gm IV x 1 dose in the ED.  Pharmacy has been consulted for Vancomycin and Cefepime dosing.  Plan: Cefepime 2gm IV q12h Vancomycin 1750mg  IV x 1 followed by Vancomycin 1250 mg IV Q 24 hrs. Goal AUC 400-550. Expected AUC: 467.3  SCr used: 0.98 Follow renal function Follow culture results and sensitivities Monitor vancomycin levels as needed     Temp (24hrs), Avg:93.8 F (34.3 C), Min:92.3 F (33.5 C), Max:95.5 F (35.3 C)  Recent Labs  Lab 08/15/23 0249 08/16/23 0304 08/16/23 0307 08/18/23 0906 08/21/23 2015 08/21/23 2028  WBC 4.2  --  3.8* 4.1 3.7*  --   CREATININE 1.30* 1.32*  --  1.17 0.98  --   LATICACIDVEN  --   --   --   --   --  1.0    Estimated Creatinine Clearance: 49.5 mL/min (by C-G formula based on SCr of 0.98 mg/dL).    No Known Allergies  Antimicrobials this admission: 8/31 Cefriaxone >> 9/1 9/1 Cefepime >>   9/1 Vancomycin >>  Dose adjustments this admission:    Microbiology results: 8/31 BCx:      Thank you for allowing pharmacy to be a part of this patient's care.  Maryellen Pile, PharmD 08/22/2023 12:28 AM

## 2023-08-22 NOTE — Assessment & Plan Note (Signed)
Unclear repeated hypothermia, hypotension Still possible persistent infiltrate   Given recurrent admission will obtain CT abd pelvis to eval for occult infection

## 2023-08-22 NOTE — Progress Notes (Signed)
PROGRESS NOTE    Barry Burgess  ZOX:[id number] DOB: 1926-12-08 DOA: 08/21/2023 PCP: [name], MD  Outpatient Specialists:     Brief Narrative:  Patient is a 89 year old male with past medical history significant for aortic insufficiency, atrial fibrillation with RVR, BPH, hypertension, hyperlipidemia, glaucoma, MR and TR.  Patient was discharged from the hospital on 08/18/2023 after treatment for UTI/CAP/sepsis.  Patient was admitted with fatigue, weakness, hypotension and hypothermia.  08/22/2023: Vitals are stable.  Temperature 97.5 F reported.  Blood pressure is 102/49 mmHg.  Pertinent lab work revealed albumin of 3, AST of 46, prealbumin of 15.  WBC is 3.8, hemoglobin of 9.7, hematocrit of 29.8 and platelet count of 112.  TSH is 5.14, with a free T4 of 1.11.  Procalcitonin is less than 0.1.  Patient is currently on IV vancomycin and cefepime.  Recent treatment for pneumonia/UTI/sepsis noted.  Cortisol level is 15.  Will still proceed with cosyntropin stimulation test.  Check troponin.  Patient was seen alongside patient's son.  CTA chest and CT of the abdomen and pelvis reveals: 1. Negative for acute pulmonary embolism. 2. Pulmonary parenchymal findings suspicious for bronchopneumonia and/or aspiration. 3. Small right-greater-than-left pleural effusions and associated atelectasis. 4. Trace pericholecystic fluid may be related to volume status/CHF. If there is concern for cholecystitis consider right upper quadrant ultrasound. 5. Large hiatal hernia with intrathoracic stomach.   Aortic Atherosclerosis (ICD10-I70.0).  CT head without contrast: -Negative for any acute intracranial abnormality.     Assessment & Plan:   Principal Problem:   SIRS (systemic inflammatory response syndrome) (HCC) Active Problems:   HTN (hypertension)   HLD (hyperlipidemia)   BPH (benign prostatic hyperplasia)   Paroxysmal atrial fibrillation (HCC)   Hypothermia   Chronic systolic CHF  (congestive heart failure) (HCC)   Hypotension: -Blood pressure is improving.  Systolic blood pressure is 102 mmHg. -Patient is on midodrine.   HLD (hyperlipidemia) Hold statin given elevated LFT's   Paroxysmal atrial fibrillation (HCC) On Eliquis. -Heart rate is controlled.     BPH (benign prostatic hyperplasia) Proscar is currently on hold.     Hypothermia -Cortisol is 15.     -Will still proceed with cosyntropin stimulation test. -Check troponin. -Recent treatment for sepsis/pneumonia/UTI. -Procalcitonin is less than 0.1. -Free T4 is normal.   Chronic systolic CHF (congestive heart failure) (HCC) -Follow echocardiogram.      SIRS (systemic inflammatory response syndrome) (HCC) Unclear repeated hypothermia, hypotension       CTA chest, CT abdomen and pelvis result is as documented above.    Bilateral lower extremity edema with redness of the skin: -Patient seems to have chronic edema. -Redness of the skin of lower legs, but no warmth to touch. -Cannot completely rule out cellulitis. -Follow the results of echocardiogram. -Albumin is 3.   -Low threshold to try IV albumin. -Discontinue IV fluids.  Will rather use IV albumin.  Monitor pulmonary status closely if and when IV albumin is used.      DVT prophylaxis: SCD. Code Status: DNR/DNI Family Communication: Son at bedside Disposition Plan: This will depend on hospital course   Consultants:  None  Procedures:  Echocardiogram result is pending.  Antimicrobials:  IV cefepime IV vancomycin   Subjective: -Continues to report feeling weak.  Objective: Vitals:   08/22/23 0700 08/22/23 0800 08/22/23 0840 08/22/23 0900  BP: (!) 110/59 (!) 112/59  107/64  Pulse: 66 (!) 56  78  Resp: 11 14  16   Temp:   (!) 97.5 F (36.4  C)   TempSrc:   Oral   SpO2: 96% 94%  96%    Intake/Output Summary (Last 24 hours) at 08/22/2023 1055 Last data filed at 08/22/2023 1000 Gross per 24 hour  Intake 2114.72 ml  Output 300  ml  Net 1814.72 ml   There were no vitals filed for this visit.  Examination:  General exam: Appears calm and comfortable.  Not in any distress.  Patient is pale. HEENT: Patient is pale.  No jaundice. Neck: Supple. Respiratory system: Clear to auscultation.  Cardiovascular system: S1 & S2 heard Gastrointestinal system: Abdomen is soft and nontender.   Central nervous system: Alert and oriented.  Patient moves all extremities.  Extremities: 2+ to 3 edema bilateral lower legs.  Redness of the skin of lower legs, but not warm to touch.  Data Reviewed: I have personally reviewed following labs and imaging studies  CBC: Recent Labs  Lab 08/16/23 0307 08/18/23 0906 08/21/23 2015 08/22/23 0357  WBC 3.8* 4.1 3.7* 3.8*  NEUTROABS  --  2.8 2.8  --   HGB 10.9* 10.9* 10.9* 9.7*  HCT 34.0* 34.4* 33.6* 29.8*  MCV 97.1 96.6 94.1 93.1  PLT 103* 110* 122* 112*   Basic Metabolic Panel: Recent Labs  Lab 08/16/23 0304 08/18/23 0906 08/21/23 2015 08/22/23 0055 08/22/23 0357  NA 139 143 137  --  139  K 3.8 4.0 3.8  --  3.9  CL 109 112* 102  --  104  CO2 25 25 26   --  26  GLUCOSE 104* 103* 120*  --  85  BUN 34* 32* 23  --  21  CREATININE 1.32* 1.17 0.98  --  1.01  CALCIUM 8.3* 8.8* 8.5*  --  8.3*  MG  --   --   --  1.7 1.8  PHOS  --   --   --  3.4 3.5   GFR: Estimated Creatinine Clearance: 48 mL/min (by C-G formula based on SCr of 1.01 mg/dL). Liver Function Tests: Recent Labs  Lab 08/21/23 2015 08/22/23 0357  AST 49* 46*  ALT 47* 44  ALKPHOS 66 60  BILITOT 0.4 0.7  PROT 6.2* 5.6*  ALBUMIN 3.5 3.0*   No results for input(s): "LIPASE", "AMYLASE" in the last 168 hours. Recent Labs  Lab 08/22/23 0055  AMMONIA 24   Coagulation Profile: Recent Labs  Lab 08/21/23 2015  INR 1.3*   Cardiac Enzymes: Recent Labs  Lab 08/22/23 0055  CKTOTAL 68   BNP (last 3 results) No results for input(s): "PROBNP" in the last 8760 hours. HbA1C: No results for input(s): "HGBA1C"  in the last 72 hours. CBG: No results for input(s): "GLUCAP" in the last 168 hours. Lipid Profile: No results for input(s): "CHOL", "HDL", "LDLCALC", "TRIG", "CHOLHDL", "LDLDIRECT" in the last 72 hours. Thyroid Function Tests: Recent Labs    08/22/23 0100 08/22/23 0357  TSH  --  5.140*  FREET4 1.11  --    Anemia Panel: Recent Labs    08/22/23 0100 08/22/23 0357  VITAMINB12  --  495  FOLATE  --  4.2*  FERRITIN 47  --   TIBC 307  --   IRON 43*  --   RETICCTPCT  --  1.8   Urine analysis:    Component Value Date/Time   COLORURINE STRAW (A) 08/21/2023 1953   APPEARANCEUR CLEAR 08/21/2023 1953   LABSPEC 1.005 08/21/2023 1953   PHURINE 5.0 08/21/2023 1953   GLUCOSEU NEGATIVE 08/21/2023 1953   HGBUR MODERATE (A) 08/21/2023 1953  BILIRUBINUR NEGATIVE 08/21/2023 1953   KETONESUR NEGATIVE 08/21/2023 1953   PROTEINUR NEGATIVE 08/21/2023 1953   NITRITE NEGATIVE 08/21/2023 1953   LEUKOCYTESUR NEGATIVE 08/21/2023 1953   Sepsis Labs: @LABRCNTIP (procalcitonin:4,lacticidven:4)  ) Recent Results (from the past 240 hour(s))  Blood Culture (routine x 2)     Status: None   Collection Time: 08/12/23 10:28 PM   Specimen: BLOOD  Result Value Ref Range Status   Specimen Description   Final    BLOOD BLOOD RIGHT FOREARM Performed at [facility], [address], [place], [place] [address]    Special Requests   Final    BOTTLES DRAWN AEROBIC AND ANAEROBIC Blood Culture results may not be optimal due to an excessive volume of blood received in culture bottles Performed at [facility], [address] [place], [place] [address]    Culture   Final    NO GROWTH 5 DAYS Performed at [facility], [address] [place], [place] [address]    Report Status 08/18/2023 FINAL  Final  Blood Culture (routine x 2)     Status: None   Collection Time: 08/12/23 10:37 PM   Specimen: BLOOD  Result Value Ref Range Status   Specimen Description   Final     BLOOD BLOOD RIGHT FOREARM Performed at [facility], [address] [place], [place] [address]    Special Requests   Final    BOTTLES DRAWN AEROBIC AND ANAEROBIC Blood Culture adequate volume Performed at [facility], [address] [place], [place] [address]    Culture   Final    NO GROWTH 5 DAYS Performed at [facility], [address] [place], [place] [address]    Report Status 08/18/2023 FINAL  Final  Urine Culture     Status: Abnormal   Collection Time: 08/13/23 12:24 AM   Specimen: Urine, Clean Catch  Result Value Ref Range Status   Specimen Description   Final    URINE, CLEAN CATCH Performed at [facility], [address], [place], [place] [address]    Special Requests   Final    NONE Performed at [facility], [address], [place], [place] [address]    Culture MULTIPLE SPECIES PRESENT, SUGGEST RECOLLECTION (A)  Final   Report Status 08/14/2023 FINAL  Final  MRSA Next Gen by PCR, Nasal     Status: None   Collection Time: 08/13/23 10:05 AM   Specimen: Nasal Mucosa; Nasal Swab  Result Value Ref Range Status   MRSA by PCR Next Gen NOT DETECTED NOT DETECTED Final    Comment: (NOTE) The GeneXpert MRSA Assay (FDA approved for NASAL specimens only), is one component of a comprehensive MRSA colonization surveillance program. It is not intended to diagnose MRSA infection nor to guide or monitor treatment for MRSA infections. Test performance is not FDA approved in patients less than 14 years old. Performed at [facility], [address] [place], [place] [address]   Blood Culture (routine x 2)     Status: None (Preliminary result)   Collection Time: 08/21/23  7:52 PM   Specimen: BLOOD LEFT ARM  Result Value Ref Range Status   Specimen Description   Final    BLOOD LEFT ARM Performed at [facility], [address], [place], [place]  [address]    Special Requests   Final    BOTTLES DRAWN AEROBIC AND ANAEROBIC Blood Culture adequate  volume Performed at [facility], [address], [place], [place] [address]    Culture   Final    NO GROWTH < 12 HOURS Performed at [facility], [address] [place], [place] [address]    Report Status PENDING  Incomplete  Blood Culture (routine x 2)     Status: None (Preliminary result)   Collection Time: 08/21/23  8:15 PM   Specimen: Right Antecubital; Blood  Result Value Ref Range Status   Specimen Description   Final    RIGHT ANTECUBITAL Performed at [facility], [address], [place], [place] [address]    Special Requests   Final    BOTTLES DRAWN AEROBIC AND ANAEROBIC Blood Culture adequate volume Performed at [facility], [address], [place], [place] [address]    Culture   Final    NO GROWTH < 12 HOURS Performed at [facility], [address] [place], [place] [address]    Report Status PENDING  Incomplete  Resp panel by RT-PCR (RSV, Flu A&B, Covid) Anterior Nasal Swab     Status: None   Collection Time: 08/21/23  8:30 PM   Specimen: Anterior Nasal Swab  Result Value Ref Range Status   SARS Coronavirus 2 by RT PCR NEGATIVE NEGATIVE Final    Comment: (NOTE) SARS-CoV-2 target nucleic acids are NOT DETECTED.  The SARS-CoV-2 RNA is generally detectable in upper respiratory specimens during the acute phase of infection. The lowest concentration of SARS-CoV-2 viral copies this assay can detect is 138 copies/mL. A negative result does not preclude SARS-Cov-2 infection and should not be used as the sole basis for treatment or other patient management decisions. A negative result may occur with  improper specimen collection/handling, submission of specimen other than nasopharyngeal swab, presence of viral mutation(s) within the areas targeted by this assay, and inadequate number of viral copies(<138  copies/mL). A negative result must be combined with clinical observations, patient history, and epidemiological information. The expected result is Negative.  Fact Sheet for Patients:  BloggerCourse.com  Fact Sheet for Healthcare Providers:  SeriousBroker.it  This test is no t yet approved or cleared by the [place] FDA and  has been authorized for detection and/or diagnosis of SARS-CoV-2 by FDA under an Emergency Use Authorization (EUA). This EUA will remain  in effect (meaning this test can be used) for the duration of the COVID-19 declaration under Section 564(b)(1) of the Act, 21 U.S.C.section 360bbb-3(b)(1), unless the authorization is terminated  or revoked sooner.       Influenza A by PCR NEGATIVE NEGATIVE Final   Influenza B by PCR NEGATIVE NEGATIVE Final    Comment: (NOTE) The Xpert Xpress SARS-CoV-2/FLU/RSV plus assay is intended as an aid in the diagnosis of influenza from Nasopharyngeal swab specimens and should not be used as a sole basis for treatment. Nasal washings and aspirates are unacceptable for Xpert Xpress SARS-CoV-2/FLU/RSV testing.  Fact Sheet for Patients: BloggerCourse.com  Fact Sheet for Healthcare Providers: SeriousBroker.it  This test is not yet approved or cleared by the [place] FDA and has been authorized for detection and/or diagnosis of SARS-CoV-2 by FDA under an Emergency Use Authorization (EUA). This EUA will remain in effect (meaning this test can be used) for the duration of the COVID-19 declaration under Section 564(b)(1) of the Act, 21 U.S.C. section 360bbb-3(b)(1), unless the authorization is terminated or revoked.     Resp Syncytial Virus by PCR NEGATIVE NEGATIVE Final  Comment: (NOTE) Fact Sheet for Patients: BloggerCourse.com  Fact Sheet for Healthcare  Providers: SeriousBroker.it  This test is not yet approved or cleared by the [place] FDA and has been authorized for detection and/or diagnosis of SARS-CoV-2 by FDA under an Emergency Use Authorization (EUA). This EUA will remain in effect (meaning this test can be used) for the duration of the COVID-19 declaration under Section 564(b)(1) of the Act, 21 U.S.C. section 360bbb-3(b)(1), unless the authorization is terminated or revoked.  Performed at [facility], [address] [place], [place] [address]   MRSA Next Gen by PCR, Nasal     Status: None   Collection Time: 08/22/23 12:18 AM   Specimen: Nasal Mucosa; Nasal Swab  Result Value Ref Range Status   MRSA by PCR Next Gen NOT DETECTED NOT DETECTED Final    Comment: (NOTE) The GeneXpert MRSA Assay (FDA approved for NASAL specimens only), is one component of a comprehensive MRSA colonization surveillance program. It is not intended to diagnose MRSA infection nor to guide or monitor treatment for MRSA infections. Test performance is not FDA approved in patients less than 7 years old. Performed at [facility], [address], [place], [place] [address]          Radiology Studies: CT Angio Chest Pulmonary Embolism (PE) W or WO Contrast  Result Date: 08/22/2023 CLINICAL DATA:  Not feeling Better from a few days ago. Continued fatigued and weakness. Pulmonary embolism suspected. EXAM: CT ANGIOGRAPHY CHEST CT ABDOMEN AND PELVIS WITH CONTRAST TECHNIQUE: Multidetector CT imaging of the chest was performed using the standard protocol during bolus administration of intravenous contrast. Multiplanar CT image reconstructions and MIPs were obtained to evaluate the vascular anatomy. Multidetector CT imaging of the abdomen and pelvis was performed using the standard protocol during bolus administration of intravenous contrast. RADIATION DOSE REDUCTION: This exam was  performed according to the departmental dose-optimization program which includes automated exposure control, adjustment of the mA and/or kV according to patient size and/or use of iterative reconstruction technique. CONTRAST:  OMNIPAQUE IOHEXOL 350 MG/ML SOLN COMPARISON:  Abdominal radiograph 08/21/2023; chest radiograph 08/21/2023 FINDINGS: CTA CHEST FINDINGS Cardiovascular: Negative for acute pulmonary embolism. Cardiomegaly. No pericardial effusion. Aortic and coronary artery atherosclerotic calcification. Mediastinum/Nodes: Trachea is unremarkable. Large hiatal hernia with intrathoracic stomach. Prominent AP window node is likely reactive. Lungs/Pleura: Small right-greater-than-left pleural effusions and associated atelectasis. Peribronchovascular consolidation in the lower lobes. Posterior ground-glass opacities in the lingula. Scattered mucous plugging no pneumothorax. Musculoskeletal: No acute fracture. Review of the MIP images confirms the above findings. CT ABDOMEN and PELVIS FINDINGS Hepatobiliary: Hepatic cysts including a large cyst in the posterior right hepatic lobe. Mild gallbladder wall hyperemia. Trace pericholecystic fluid. No biliary dilation or radiopaque stone. Pancreas: Unremarkable. Spleen: Unremarkable. Adrenals/Urinary Tract: Unremarkable adrenal glands. No urinary calculi or hydronephrosis. Unremarkable bladder. Stomach/Bowel: Normal caliber large and small bowel. Colonic diverticulosis without diverticulitis. Diverticulosis of the terminal ileum. Normal appendix. Large hiatal hernia with intrathoracic stomach. Vascular/Lymphatic: Aortic atherosclerosis. No enlarged abdominal or pelvic lymph nodes. Reproductive: Enlarged prostate. Other: No free intraperitoneal air. Musculoskeletal: No acute fracture. Review of the MIP images confirms the above findings. IMPRESSION: 1. Negative for acute pulmonary embolism. 2. Pulmonary parenchymal findings suspicious for bronchopneumonia and/or  aspiration. 3. Small right-greater-than-left pleural effusions and associated atelectasis. 4. Trace pericholecystic fluid may be related to volume status/CHF. If there is concern for cholecystitis consider right upper quadrant ultrasound. 5. Large hiatal hernia with intrathoracic stomach. Aortic Atherosclerosis (ICD10-I70.0).  Electronically Signed   By: Minerva Fester M.D.   On: 08/22/2023 02:15   CT ABDOMEN PELVIS W CONTRAST  Result Date: 08/22/2023 CLINICAL DATA:  Not feeling Better from a few days ago. Continued fatigued and weakness. Pulmonary embolism suspected. EXAM: CT ANGIOGRAPHY CHEST CT ABDOMEN AND PELVIS WITH CONTRAST TECHNIQUE: Multidetector CT imaging of the chest was performed using the standard protocol during bolus administration of intravenous contrast. Multiplanar CT image reconstructions and MIPs were obtained to evaluate the vascular anatomy. Multidetector CT imaging of the abdomen and pelvis was performed using the standard protocol during bolus administration of intravenous contrast. RADIATION DOSE REDUCTION: This exam was performed according to the departmental dose-optimization program which includes automated exposure control, adjustment of the mA and/or kV according to patient size and/or use of iterative reconstruction technique. CONTRAST:  OMNIPAQUE IOHEXOL 350 MG/ML SOLN COMPARISON:  Abdominal radiograph 08/21/2023; chest radiograph 08/21/2023 FINDINGS: CTA CHEST FINDINGS Cardiovascular: Negative for acute pulmonary embolism. Cardiomegaly. No pericardial effusion. Aortic and coronary artery atherosclerotic calcification. Mediastinum/Nodes: Trachea is unremarkable. Large hiatal hernia with intrathoracic stomach. Prominent AP window node is likely reactive. Lungs/Pleura: Small right-greater-than-left pleural effusions and associated atelectasis. Peribronchovascular consolidation in the lower lobes. Posterior ground-glass opacities in the lingula. Scattered mucous plugging no  pneumothorax. Musculoskeletal: No acute fracture. Review of the MIP images confirms the above findings. CT ABDOMEN and PELVIS FINDINGS Hepatobiliary: Hepatic cysts including a large cyst in the posterior right hepatic lobe. Mild gallbladder wall hyperemia. Trace pericholecystic fluid. No biliary dilation or radiopaque stone. Pancreas: Unremarkable. Spleen: Unremarkable. Adrenals/Urinary Tract: Unremarkable adrenal glands. No urinary calculi or hydronephrosis. Unremarkable bladder. Stomach/Bowel: Normal caliber large and small bowel. Colonic diverticulosis without diverticulitis. Diverticulosis of the terminal ileum. Normal appendix. Large hiatal hernia with intrathoracic stomach. Vascular/Lymphatic: Aortic atherosclerosis. No enlarged abdominal or pelvic lymph nodes. Reproductive: Enlarged prostate. Other: No free intraperitoneal air. Musculoskeletal: No acute fracture. Review of the MIP images confirms the above findings. IMPRESSION: 1. Negative for acute pulmonary embolism. 2. Pulmonary parenchymal findings suspicious for bronchopneumonia and/or aspiration. 3. Small right-greater-than-left pleural effusions and associated atelectasis. 4. Trace pericholecystic fluid may be related to volume status/CHF. If there is concern for cholecystitis consider right upper quadrant ultrasound. 5. Large hiatal hernia with intrathoracic stomach. Aortic Atherosclerosis (ICD10-I70.0). Electronically Signed   By: Minerva Fester M.D.   On: 08/22/2023 02:15   DG Abd 1 View  Result Date: 08/22/2023 CLINICAL DATA:  Constipation EXAM: ABDOMEN - 1 VIEW COMPARISON:  None Available. FINDINGS: The bowel gas pattern is normal. No radio-opaque calculi or other significant radiographic abnormality are seen. No excessive stool burden. IMPRESSION: No acute findings. Electronically Signed   By: Charlett Nose M.D.   On: 08/22/2023 00:16   CT HEAD WO CONTRAST ( )  Result Date: 08/22/2023 CLINICAL DATA:  Altered mental status. EXAM: CT HEAD  WITHOUT CONTRAST TECHNIQUE: Contiguous axial images were obtained from the base of the skull through the vertex without intravenous contrast. RADIATION DOSE REDUCTION: This exam was performed according to the departmental dose-optimization program which includes automated exposure control, adjustment of the mA and/or kV according to patient size and/or use of iterative reconstruction technique. COMPARISON:  08/12/2023 FINDINGS: Brain: Patchy low-density in the deep white matter, most pronounced in the frontal lobes, stable. No acute intracranial abnormality. Specifically, no hemorrhage, hydrocephalus, mass lesion, acute infarction, or significant intracranial injury. Vascular: No hyperdense vessel or unexpected calcification. Skull: No acute calvarial abnormality. Sinuses/Orbits: No acute findings Other: None IMPRESSION: No acute intracranial abnormality. Electronically Signed  By: Charlett Nose M.D.   On: 08/22/2023 00:16   DG Chest Port 1 View  Result Date: 08/21/2023 CLINICAL DATA:  Possible sepsis.  Continued weakness and fatigue. EXAM: PORTABLE CHEST 1 VIEW COMPARISON:  08/17/2023. FINDINGS: Heart is enlarged and the mediastinal contour stable. A density containing air is noted in the retrocardiac space on the right, suggesting hiatal hernia. There is atherosclerotic calcification of the aorta. Patchy airspace disease is present at the right lung base. The left lung base is excluded from the field of view. There is a small right pleural effusion. No pneumothorax is seen. No acute osseous abnormality. IMPRESSION: 1. Small right pleural effusion with atelectasis or infiltrate. 2. Left lung base is excluded from the field of view. 3. Cardiomegaly. 4. Hiatal hernia. Electronically Signed   By: Thornell Sartorius M.D.   On: 08/21/2023 21:40        Scheduled Meds:  apixaban  5 mg Oral BID   Chlorhexidine Gluconate Cloth  6 each Topical Daily   latanoprost  1 drop Both Eyes QPM   midodrine  5 mg Oral TID WC    Continuous Infusions:  ceFEPime (MAXIPIME) IV Stopped (08/22/23 0147)   lactated ringers 75 mL/hr at 08/22/23 1000   [START ON 08/23/2023] vancomycin       LOS: 0 days    Time spent: 55 minutes    Berton Mount, MD  [facility] Pager #: [phone] 7PM-7AM contact night coverage as above

## 2023-08-22 NOTE — Evaluation (Addendum)
Occupational Therapy Evaluation Patient Details Name: Barry Burgess MRN: [id number] DOB: 12-07-1926 Today's Date: 08/22/2023   History of Present Illness Patient is a 89 year old male who presented with hypotension, weakness, and fatigue. Patient was admitted with hypothermia. Recent d/c from hospital on 8/28 for UTI, CAP and sepsis. PMH:HLD, BPH, bilateral lower extremity edema, a fib   Clinical Impression   Patient is a 89 year old male who was admitted for above. Patient was living at home with family support at [facility] level prior to admission. Patient was noted to have decreased functional activity tolerance, decreased endurance, decreased standing balance, decreased safety awareness, and decreased knowledge of AD/AE impacting participation in ADLs. Patient does have a history of learned helplessness behaviors during previous admission. Patient's plan is to d/c home with family support at time of d/c. Patient would continue to benefit from skilled OT services at this time while admitted and after d/c to address noted deficits in order to improve overall safety and independence in ADLs.           If plan is discharge home, recommend the following: A lot of help with bathing/dressing/bathroom;Assistance with cooking/housework;Assist for transportation;Help with stairs or ramp for entrance;A little help with walking and/or transfers    Functional Status Assessment  Patient has had a recent decline in their functional status and demonstrates the ability to make significant improvements in function in a reasonable and predictable amount of time.  Equipment Recommendations  None recommended by OT    Recommendations for Other Services       Precautions / Restrictions Precautions Precautions: Fall Restrictions Weight Bearing Restrictions: No      Mobility Bed Mobility Overal bed mobility: Needs Assistance Bed Mobility: Supine to Sit     Supine to sit: Supervision, HOB elevated, Used  rails     General bed mobility comments: patient reported "i cant" but was able to progress to EOB with no physical A with increased time.          Balance Overall balance assessment: Needs assistance Sitting-balance support: No upper extremity supported, Feet unsupported Sitting balance-Leahy Scale: Good     Standing balance support: Bilateral upper extremity supported, During functional activity, Reliant on assistive device for balance Standing balance-Leahy Scale: Poor           ADL either performed or assessed with clinical judgement   ADL Overall ADL's : Needs assistance/impaired Eating/Feeding: Supervision/ safety;Sitting Eating/Feeding Details (indicate cue type and reason): patients nurse in SDU educated on putting patients feet down so he can eat. see treatment note from this therapist during last hosptialization for more information on this issue. Grooming: Sitting;Supervision/safety   Upper Body Bathing: Set up;Sitting   Lower Body Bathing: Cueing for back precautions;Sitting/lateral leans   Upper Body Dressing : Minimal assistance;Sitting   Lower Body Dressing: Maximal assistance;Sit to/from stand Lower Body Dressing Details (indicate cue type and reason): patient declined to attempt to don/doff socks reporting " my son does that at home" Toilet Transfer: Contact guard assist;Ambulation;Rolling walker (2 wheels) Toilet Transfer Details (indicate cue type and reason): to recliner in room with increased time. patient reported " i cant do this" multiple tiems during session but noted to be able to complete without increased A that patient is waiting for. Toileting- Clothing Manipulation and Hygiene: Sit to/from stand;Maximal assistance               Vision Baseline Vision/History: 1 Wears glasses Vision Assessment?: Wears glasses for reading;Wears glasses for  driving Additional Comments: strabismus r eye , needed cues to scan for items in room.             Pertinent Vitals/Pain Pain Assessment Pain Assessment: No/denies pain     Extremity/Trunk Assessment Upper Extremity Assessment Upper Extremity Assessment: Overall WFL for tasks assessed (patient has full ROM of BUE with noted edema pooling around elbows bilaterally. this was also present during last treatment as well per notes)   Lower Extremity Assessment RLE Deficits / Details: edema noted BLEs   Cervical / Trunk Assessment Cervical / Trunk Assessment: Kyphotic   Communication Communication Communication: Hearing impairment   Cognition Arousal: Alert Behavior During Therapy: WFL for tasks assessed/performed Overall Cognitive Status: Difficult to assess Area of Impairment: Safety/judgement       General Comments: patients daughter in law's brother is present in room during session. patient HOH impacting cog assessment as well.     General Comments  preexisting spot on patients R shin anterior was noted to start weeping with standing. nurse in room at this time and made aware.            Home Living Family/patient expects to be discharged to:: Private residence Living Arrangements: Children Available Help at Discharge: Family;Available 24 hours/day Type of Home: House Home Access: Ramped entrance     Home Layout: One level     Bathroom Shower/Tub: Producer, television/film/video: Standard     Home Equipment: Agricultural consultant (2 wheels);Shower seat;Wheelchair - manual;Toilet riser;Grab bars - tub/shower          Prior Functioning/Environment Prior Level of Function : Needs assist             Mobility Comments: uses RW, no falls in past 6 months ADLs Comments: min assist for for ADLs from son, patient reporting son gets socks on for him at home        OT Problem List: Decreased strength;Decreased activity tolerance;Impaired balance (sitting and/or standing);Decreased safety awareness      OT Treatment/Interventions: Self-care/ADL  training;Therapeutic exercise;DME and/or AE instruction;Therapeutic activities;Patient/family education;Balance training    OT Goals(Current goals can be found in the care plan section) Acute Rehab OT Goals Patient Stated Goal: none stated OT Goal Formulation: With patient Time For Goal Achievement: 09/05/23 Potential to Achieve Goals: Fair  OT Frequency: Min 1X/week    Co-evaluation PT/OT/SLP Co-Evaluation/Treatment: Yes Reason for Co-Treatment: To address functional/ADL transfers;Necessary to address cognition/behavior during functional activity PT goals addressed during session: Mobility/safety with mobility OT goals addressed during session: ADL's and self-care      AM-PAC OT "6 Clicks" Daily Activity     Outcome Measure Help from another person eating meals?: None Help from another person taking care of personal grooming?: A Little Help from another person toileting, which includes using toliet, bedpan, or urinal?: A Lot Help from another person bathing (including washing, rinsing, drying)?: A Lot Help from another person to put on and taking off regular upper body clothing?: A Little Help from another person to put on and taking off regular lower body clothing?: A Lot 6 Click Score: 16   End of Session Equipment Utilized During Treatment: Rolling walker (2 wheels);Gait belt Nurse Communication: Mobility status  Activity Tolerance: Patient tolerated treatment well Patient left: in chair;with call bell/phone within reach;with chair alarm set;with family/visitor present  OT Visit Diagnosis: Unsteadiness on feet (R26.81);Other abnormalities of gait and mobility (R26.89);Muscle weakness (generalized) (M62.81)  Time: 0981-1914 OT Time Calculation (min): 26 min Charges:  OT General Charges $OT Visit: 1 Visit OT Evaluation $OT Eval Moderate Complexity: 1 Mod  Lonette Stevison OTR/L, MS Acute Rehabilitation Department Office# [phone]   Selinda Flavin 08/22/2023, 1:30  PM

## 2023-08-23 DIAGNOSIS — T68XXXA Hypothermia, initial encounter: Secondary | ICD-10-CM | POA: Diagnosis not present

## 2023-08-23 DIAGNOSIS — R6520 Severe sepsis without septic shock: Secondary | ICD-10-CM | POA: Diagnosis not present

## 2023-08-23 DIAGNOSIS — Z66 Do not resuscitate: Secondary | ICD-10-CM | POA: Diagnosis present

## 2023-08-23 DIAGNOSIS — H409 Unspecified glaucoma: Secondary | ICD-10-CM | POA: Diagnosis present

## 2023-08-23 DIAGNOSIS — K449 Diaphragmatic hernia without obstruction or gangrene: Secondary | ICD-10-CM | POA: Diagnosis present

## 2023-08-23 DIAGNOSIS — I5032 Chronic diastolic (congestive) heart failure: Secondary | ICD-10-CM | POA: Diagnosis not present

## 2023-08-23 DIAGNOSIS — R680 Hypothermia, not associated with low environmental temperature: Secondary | ICD-10-CM | POA: Diagnosis present

## 2023-08-23 DIAGNOSIS — R531 Weakness: Secondary | ICD-10-CM | POA: Diagnosis present

## 2023-08-23 DIAGNOSIS — Z7901 Long term (current) use of anticoagulants: Secondary | ICD-10-CM | POA: Diagnosis not present

## 2023-08-23 DIAGNOSIS — Z1152 Encounter for screening for COVID-19: Secondary | ICD-10-CM | POA: Diagnosis not present

## 2023-08-23 DIAGNOSIS — J180 Bronchopneumonia, unspecified organism: Secondary | ICD-10-CM | POA: Diagnosis present

## 2023-08-23 DIAGNOSIS — R6510 Systemic inflammatory response syndrome (SIRS) of non-infectious origin without acute organ dysfunction: Secondary | ICD-10-CM

## 2023-08-23 DIAGNOSIS — R251 Tremor, unspecified: Secondary | ICD-10-CM | POA: Diagnosis present

## 2023-08-23 DIAGNOSIS — R630 Anorexia: Secondary | ICD-10-CM | POA: Diagnosis present

## 2023-08-23 DIAGNOSIS — E538 Deficiency of other specified B group vitamins: Secondary | ICD-10-CM | POA: Diagnosis present

## 2023-08-23 DIAGNOSIS — R262 Difficulty in walking, not elsewhere classified: Secondary | ICD-10-CM | POA: Diagnosis not present

## 2023-08-23 DIAGNOSIS — J9811 Atelectasis: Secondary | ICD-10-CM | POA: Diagnosis present

## 2023-08-23 DIAGNOSIS — I10 Essential (primary) hypertension: Secondary | ICD-10-CM | POA: Diagnosis not present

## 2023-08-23 DIAGNOSIS — I4819 Other persistent atrial fibrillation: Secondary | ICD-10-CM | POA: Diagnosis not present

## 2023-08-23 DIAGNOSIS — I700 Atherosclerosis of aorta: Secondary | ICD-10-CM | POA: Diagnosis present

## 2023-08-23 DIAGNOSIS — Z8701 Personal history of pneumonia (recurrent): Secondary | ICD-10-CM | POA: Diagnosis not present

## 2023-08-23 DIAGNOSIS — Z87440 Personal history of urinary (tract) infections: Secondary | ICD-10-CM | POA: Diagnosis not present

## 2023-08-23 DIAGNOSIS — Z87891 Personal history of nicotine dependence: Secondary | ICD-10-CM | POA: Diagnosis not present

## 2023-08-23 DIAGNOSIS — A419 Sepsis, unspecified organism: Secondary | ICD-10-CM | POA: Diagnosis not present

## 2023-08-23 DIAGNOSIS — N400 Enlarged prostate without lower urinary tract symptoms: Secondary | ICD-10-CM | POA: Diagnosis present

## 2023-08-23 DIAGNOSIS — I083 Combined rheumatic disorders of mitral, aortic and tricuspid valves: Secondary | ICD-10-CM | POA: Diagnosis present

## 2023-08-23 DIAGNOSIS — E785 Hyperlipidemia, unspecified: Secondary | ICD-10-CM | POA: Diagnosis present

## 2023-08-23 DIAGNOSIS — Z6825 Body mass index (BMI) 25.0-25.9, adult: Secondary | ICD-10-CM | POA: Diagnosis not present

## 2023-08-23 DIAGNOSIS — I110 Hypertensive heart disease with heart failure: Secondary | ICD-10-CM | POA: Diagnosis present

## 2023-08-23 LAB — CBC WITH DIFFERENTIAL/PLATELET
Abs Immature Granulocytes: 0.03 10*3/uL (ref 0.00–0.07)
Basophils Absolute: 0 10*3/uL (ref 0.0–0.1)
Basophils Relative: 1 %
Eosinophils Absolute: 0.1 10*3/uL (ref 0.0–0.5)
Eosinophils Relative: 3 %
HCT: 34.4 % — ABNORMAL LOW (ref 39.0–52.0)
Hemoglobin: 11.1 g/dL — ABNORMAL LOW (ref 13.0–17.0)
Immature Granulocytes: 1 %
Lymphocytes Relative: 22 %
Lymphs Abs: 0.9 10*3/uL (ref 0.7–4.0)
MCH: 30.9 pg (ref 26.0–34.0)
MCHC: 32.3 g/dL (ref 30.0–36.0)
MCV: 95.8 fL (ref 80.0–100.0)
Monocytes Absolute: 0.4 10*3/uL (ref 0.1–1.0)
Monocytes Relative: 9 %
Neutro Abs: 2.5 10*3/uL (ref 1.7–7.7)
Neutrophils Relative %: 64 %
Platelets: 150 10*3/uL (ref 150–400)
RBC: 3.59 MIL/uL — ABNORMAL LOW (ref 4.22–5.81)
RDW: 16.4 % — ABNORMAL HIGH (ref 11.5–15.5)
WBC: 4 10*3/uL (ref 4.0–10.5)
nRBC: 0 % (ref 0.0–0.2)

## 2023-08-23 LAB — COMPREHENSIVE METABOLIC PANEL
ALT: 44 U/L (ref 0–44)
AST: 38 U/L (ref 15–41)
Albumin: 3.2 g/dL — ABNORMAL LOW (ref 3.5–5.0)
Alkaline Phosphatase: 63 U/L (ref 38–126)
Anion gap: 9 (ref 5–15)
BUN: 24 mg/dL — ABNORMAL HIGH (ref 8–23)
CO2: 25 mmol/L (ref 22–32)
Calcium: 8.4 mg/dL — ABNORMAL LOW (ref 8.9–10.3)
Chloride: 106 mmol/L (ref 98–111)
Creatinine, Ser: 1.05 mg/dL (ref 0.61–1.24)
GFR, Estimated: >60 mL/min (ref >60)
Glucose, Bld: 104 mg/dL — ABNORMAL HIGH (ref 70–99)
Potassium: 4.1 mmol/L (ref 3.5–5.1)
Sodium: 140 mmol/L (ref 135–145)
Total Bilirubin: 0.7 mg/dL (ref 0.3–1.2)
Total Protein: 6.1 g/dL — ABNORMAL LOW (ref 6.5–8.1)

## 2023-08-23 LAB — ACTH STIMULATION, 3 TIME POINTS
Cortisol, 30 Min: 32.6 ug/dL
Cortisol, 60 Min: 30.5 ug/dL
Cortisol, Base: 20 ug/dL

## 2023-08-23 LAB — T3: T3, Total: 79 ng/dL (ref 71–180)

## 2023-08-23 LAB — MAGNESIUM: Magnesium: 2.1 mg/dL (ref 1.7–2.4)

## 2023-08-23 LAB — PHOSPHORUS: Phosphorus: 3.3 mg/dL (ref 2.5–4.6)

## 2023-08-23 LAB — PROCALCITONIN: Procalcitonin: <0.1 ng/mL

## 2023-08-23 MED ORDER — HYDROCODONE-ACETAMINOPHEN 5-325 MG PO TABS: 1.0000 | ORAL_TABLET | Freq: Four times a day (QID) | ORAL | Status: DC | PRN

## 2023-08-23 MED ORDER — TIMOLOL MALEATE 0.5 % OP SOLN
1.0000 [drp] | Freq: Two times a day (BID) | OPHTHALMIC | Status: DC
  Administered 2023-08-23 – 2023-08-25 (×5): 1 [drp] via OPHTHALMIC
  Filled 2023-08-23: qty 5

## 2023-08-23 MED ORDER — SODIUM CHLORIDE 0.9 % IV SOLN: INTRAVENOUS | Status: DC | PRN

## 2023-08-23 MED ORDER — FUROSEMIDE 40 MG PO TABS
40.0000 mg | ORAL_TABLET | Freq: Two times a day (BID) | ORAL | Status: DC
  Administered 2023-08-23 – 2023-08-25 (×4): 40 mg via ORAL
  Filled 2023-08-23 (×4): qty 1

## 2023-08-23 MED ORDER — FOLIC ACID 1 MG PO TABS
1.0000 mg | ORAL_TABLET | Freq: Every day | ORAL | Status: DC
  Administered 2023-08-23 – 2023-08-25 (×3): 1 mg via ORAL
  Filled 2023-08-23 (×3): qty 1

## 2023-08-23 MED ORDER — DORZOLAMIDE HCL-TIMOLOL MAL 2-0.5 % OP SOLN: 1.0000 [drp] | Freq: Two times a day (BID) | OPHTHALMIC | Status: DC

## 2023-08-23 MED ORDER — FERROUS SULFATE 325 (65 FE) MG PO TABS
325.0000 mg | ORAL_TABLET | Freq: Two times a day (BID) | ORAL | Status: DC
  Administered 2023-08-23 – 2023-08-25 (×4): 325 mg via ORAL
  Filled 2023-08-23 (×4): qty 1

## 2023-08-23 MED ORDER — BOOST PLUS PO LIQD
237.0000 mL | ORAL | Status: DC
  Administered 2023-08-24: 237 mL via ORAL
  Filled 2023-08-23 (×2): qty 237

## 2023-08-23 MED ORDER — SODIUM CHLORIDE 0.9 % IV SOLN
2.0000 g | INTRAVENOUS | Status: DC
  Administered 2023-08-23: 2 g via INTRAVENOUS
  Filled 2023-08-23: qty 20

## 2023-08-23 MED ORDER — DORZOLAMIDE HCL 2 % OP SOLN
1.0000 [drp] | Freq: Two times a day (BID) | OPHTHALMIC | Status: DC
  Administered 2023-08-23 – 2023-08-25 (×5): 1 [drp] via OPHTHALMIC
  Filled 2023-08-23: qty 10

## 2023-08-23 MED ORDER — FINASTERIDE 5 MG PO TABS
5.0000 mg | ORAL_TABLET | Freq: Every day | ORAL | Status: DC
  Administered 2023-08-23 – 2023-08-25 (×3): 5 mg via ORAL
  Filled 2023-08-23 (×3): qty 1

## 2023-08-23 MED ORDER — POLYETHYLENE GLYCOL 3350 17 G PO PACK: 17.0000 g | PACK | Freq: Two times a day (BID) | ORAL | Status: DC | PRN

## 2023-08-23 MED ORDER — METOPROLOL TARTRATE 5 MG/5ML IV SOLN: 2.5000 mg | INTRAVENOUS | Status: DC | PRN

## 2023-08-23 MED ORDER — METRONIDAZOLE 500 MG/100ML IV SOLN
500.0000 mg | Freq: Two times a day (BID) | INTRAVENOUS | Status: DC
  Administered 2023-08-23 (×2): 500 mg via INTRAVENOUS
  Filled 2023-08-23 (×2): qty 100

## 2023-08-23 MED ORDER — DOXAZOSIN MESYLATE 2 MG PO TABS
2.0000 mg | ORAL_TABLET | Freq: Every day | ORAL | Status: DC
  Administered 2023-08-23 – 2023-08-25 (×3): 2 mg via ORAL
  Filled 2023-08-23 (×2): qty 1
  Filled 2023-08-23: qty 2

## 2023-08-23 MED ORDER — SENNOSIDES-DOCUSATE SODIUM 8.6-50 MG PO TABS: 1.0000 | ORAL_TABLET | Freq: Two times a day (BID) | ORAL | Status: DC | PRN

## 2023-08-23 MED ORDER — PRAVASTATIN SODIUM 40 MG PO TABS
40.0000 mg | ORAL_TABLET | Freq: Every day | ORAL | Status: DC
  Administered 2023-08-23 – 2023-08-25 (×3): 40 mg via ORAL
  Filled 2023-08-23: qty 2
  Filled 2023-08-23 (×2): qty 1

## 2023-08-23 NOTE — Evaluation (Signed)
Clinical/Bedside Swallow Evaluation Patient Details  Name: Barry Burgess MRN: [id number] Date of Birth: [birth year]  Today's Date: 08/23/2023 Time: SLP Start Time (ACUTE ONLY): 1200 SLP Stop Time (ACUTE ONLY): 1220 SLP Time Calculation (min) (ACUTE ONLY): 20 min  Past Medical History:  Past Medical History:  Diagnosis Date   Aortic insufficiency    Atrial fibrillation with RVR (HCC) 10/14/2015   BPH (benign prostatic hyperplasia)    Glaucoma    Hyperlipidemia    Hypertension    Mitral regurgitation    PAF (paroxysmal atrial fibrillation) (HCC)    Tricuspid regurgitation    Past Surgical History:  Past Surgical History:  Procedure Laterality Date   CARDIOVERSION N/A 11/13/2015   Procedure: CARDIOVERSION;  Surgeon: Wendall Stade, MD;  Location: [facility] ENDOSCOPY;  Service: Cardiovascular;  Laterality: N/A;   EYE SURGERY     bilateral cataracts   PILONIDAL CYST / SINUS EXCISION     HPI:  Pt is a 89 year-old male who presented with fatigue, weakness, hyptotension, and hypothermia on 09/01. CTA revealed large hital hernia.  Recent discharged from hosptial stay on 08/28 for UTI, CAP, and sepsis. Significant PMH includes CHF, A-fib, HTN, BPH, HLD, and amublatory dysfunction.    Assessment / Plan / Recommendation  Clinical Impression  Pt was seen by SLP for bedside swallow evaluation to rule out risk of aspiration. Pt did not complain of any pain or discomfort related to swallowing or any recent changes in swallow function. He reported occasional sx of reflux. Pt told SLP he recently underwent tooth extraction that relieved previous pain/discomfort associated with chewing. Dentition appeared adequate for sufficent mastication. Pt self-fed the following consistencies: thin liquid (straw and cup), solid. No clinical s/sx of dysphagia or aspiration observed. No SLP f/u reccommended at this time. Reccommend pt continues to follow general reflux precautions. SLP Visit Diagnosis: Dysphagia,  unspecified (R13.10)    Aspiration Risk  No limitations    Diet Recommendation Thin liquid;Regular    Liquid Administration via: Spoon;Cup;Straw Medication Administration: Whole meds with liquid Supervision: Patient able to self feed Compensations: Slow rate;Small sips/bites Postural Changes: Remain upright for at least 30 minutes after po intake    Other  Recommendations Oral Care Recommendations: Oral care BID    Recommendations for follow up therapy are one component of a multi-disciplinary discharge planning process, led by the attending physician.  Recommendations may be updated based on patient status, additional functional criteria and insurance authorization.  Follow up Recommendations No SLP follow up      Assistance Recommended at Discharge    Functional Status Assessment Patient has not had a recent decline in their functional status  Frequency and Duration            Prognosis        Swallow Study   General Date of Onset: 08/22/23 HPI: Pt is a 89 year-old male who presented with fatigue, weakness, hyptotension, and hypothermia on 09/01. CTA revealed large hital hernia.  Recent discharged from hosptial stay on 08/28 for UTI, CAP, and sepsis. Significant PMH includes CHF, A-fib, HTN, BPH, HLD, and amublatory dysfunction. Type of Study: Bedside Swallow Evaluation Previous Swallow Assessment: n/a Diet Prior to this Study: Regular;Thin liquids (Level 0) Temperature Spikes Noted: No Respiratory Status: Room air History of Recent Intubation: No Behavior/Cognition: Alert;Cooperative;Pleasant mood Oral Cavity Assessment: Within Functional Limits Oral Care Completed by SLP: No Oral Cavity - Dentition: Missing dentition;Adequate natural dentition Vision: Functional for self-feeding Self-Feeding Abilities: Able to feed self Baseline Vocal  Quality: Normal Volitional Swallow: Able to elicit    Oral/Motor/Sensory Function Overall Oral Motor/Sensory Function: Within  functional limits   Ice Chips Ice chips: Not tested   Thin Liquid Thin Liquid: Within functional limits Presentation: Cup;Straw;Self Fed    Nectar Thick Nectar Thick Liquid: Not tested   Honey Thick Honey Thick Liquid: Not tested   Puree Puree: Not tested   Solid     Solid: Within functional limits Presentation: Self Fed      Marline Backbone, B.S., Speech Therapy Student

## 2023-08-23 NOTE — Care Management Obs Status (Signed)
MEDICARE OBSERVATION STATUS NOTIFICATION   Patient Details  Name: Barry Burgess MRN: [id number] Date of Birth: [birth year]   Medicare Observation Status Notification Given:  Yes    Beckie Busing, RN 08/23/2023, 10:25 AM

## 2023-08-23 NOTE — Progress Notes (Signed)
Occupational Therapy Treatment Patient Details Name: Barry Burgess MRN: [id number] DOB: 1926/09/29 Today's Date: 08/23/2023   History of present illness Patient is a 89 year old male who presented with hypotension, weakness, and fatigue. Patient was admitted with hypothermia. Recent d/c from hospital on 8/28 for UTI, CAP and sepsis. PMH:HLD, BPH, bilateral lower extremity edema, a fib   OT comments  Patient's family member was in room feeding patient breakfast this AM upon entry to room. Patient and family were educated on importance of self feeding and other light ADL tasks for patient to complete himself especially when he was more than physically able to. Patient and family verbalized understanding. Patient was educated on sitting up in recliner for better positioning for all meals. Patient and nurse verbalized understanding. Patient was CGA with RW to transfer to recliner in room with cues to stop turning at appropriate time. Patient appears to be reliant on cues to complete tasks and will allow others to complete things for him. VERY HIGH risk of learned helplessness. Patient's discharge plan remains appropriate at this time. OT will continue to follow acutely.        If plan is discharge home, recommend the following:  A lot of help with bathing/dressing/bathroom;Assistance with cooking/housework;Assist for transportation;Help with stairs or ramp for entrance;A little help with walking and/or transfers   Equipment Recommendations  None recommended by OT       Precautions / Restrictions Precautions Precautions: Fall Restrictions Weight Bearing Restrictions: No       Mobility Bed Mobility Overal bed mobility: Needs Assistance Bed Mobility: Supine to Sit     Supine to sit: Supervision, HOB elevated, Used rails     General bed mobility comments: able to progress to EOB with no physical A with increased time.         Balance Overall balance assessment: Needs  assistance Sitting-balance support: No upper extremity supported, Feet unsupported Sitting balance-Leahy Scale: Good     Standing balance support: Bilateral upper extremity supported, During functional activity, Reliant on assistive device for balance Standing balance-Leahy Scale: Poor       ADL either performed or assessed with clinical judgement   ADL Overall ADL's : Needs assistance/impaired Eating/Feeding: Supervision/ safety;Sitting Eating/Feeding Details (indicate cue type and reason): patient was sitting in bed with hands under covers while daughter in law feeds him breakfast. therapist entered room and asked why he was being fed. patients daughter in law "Barry Burgess" reported that his arms were swollen so she was feeding him. patient and family were educated on importance of self feeding to maintain independence and prevent learned helplessness. patients daughter in law reported " i knew i was going to get caught". patients nurse made aware of recommendations for patient to be out of bed for all meals. nurse verbalized understanding. patient once sitting in recliner was able to participate in self feeding with supervision and increased time.         General ADL Comments: patient was CGA to transfer from bed to recliner in room with cues for touching back of chair and not to turn too far past chair. patient very dependent on cues for all tasks to be complete but able to complete with limited A.      Cognition Arousal: Alert Behavior During Therapy: WFL for tasks assessed/performed       General Comments: patient is HOH but cooperative. HIGH risk of learned helplessness.  Pertinent Vitals/ Pain       Pain Assessment Pain Assessment: No/denies pain         Frequency  Min 1X/week        Progress Toward Goals  OT Goals(current goals can now be found in the care plan section)  Progress towards OT goals: Progressing toward goals     Plan          AM-PAC OT "6 Clicks" Daily Activity     Outcome Measure   Help from another person eating meals?: None Help from another person taking care of personal grooming?: A Little Help from another person toileting, which includes using toliet, bedpan, or urinal?: A Lot Help from another person bathing (including washing, rinsing, drying)?: A Lot Help from another person to put on and taking off regular upper body clothing?: A Little Help from another person to put on and taking off regular lower body clothing?: A Lot 6 Click Score: 16    End of Session Equipment Utilized During Treatment: Rolling walker (2 wheels);Gait belt  OT Visit Diagnosis: Unsteadiness on feet (R26.81);Other abnormalities of gait and mobility (R26.89);Muscle weakness (generalized) (M62.81)   Activity Tolerance Patient tolerated treatment well   Patient Left in chair;with call bell/phone within reach;with family/visitor present   Nurse Communication Mobility status        Time: 1610-9604 OT Time Calculation (min): 16 min  Charges: OT General Charges $OT Visit: 1 Visit OT Treatments $Self Care/Home Management : 8-22 mins  Rosalio Loud, MS Acute Rehabilitation Department Office# [phone]   Selinda Flavin 08/23/2023, 11:44 AM

## 2023-08-23 NOTE — Progress Notes (Signed)
PROGRESS NOTE  CHRSITOPHER Burgess FAO:[id number] DOB: Jan 08, 1926   PCP: [name], MD  Patient is from: Home.  DOA: 08/21/2023 LOS: 0  Chief complaints Chief Complaint  Patient presents with   Fatigue     Brief Narrative / Interim history: 89 year old M with PMH of diastolic CHF, A-fib, HTN, BPH, HLD, ambulatory dysfunction and recent hospitalization for pneumonia and UTI returning with excessive fatigue and admitted with working diagnosis of SIRS, hypotension and hypothermia to 92.3 F.  UA not suggesting UTI.  Cultures obtained.  Started on broad-spectrum antibiotics.  CTA chest negative for PE but large hiatal hernia and pulmonary parenchymal findings suspicious for bronchopneumonia and/or aspiration and trace cholecystic fluid.   Blood cultures NGTD.  Antibiotics de-escalated.  Hypothermia and hypotension resolved.  Clinically improved.   Subjective: Seen and examined earlier this morning.  No major events overnight of this morning.  Feels much better.  Feels stronger.  Denies chest pain, shortness of breath, GI or UTI symptoms.  Daughter-in-law at bedside.  Objective: Vitals:   08/23/23 0800 08/23/23 0900 08/23/23 1000 08/23/23 1120  BP: (!) 141/52 134/80 138/60 (!) 127/51  Pulse: (!) 59 71 73   Resp: 12 14 16    Temp:      TempSrc:      SpO2: 96% 97% 95%     Examination:  GENERAL: No apparent distress.  Nontoxic. HEENT: MMM.  Vision and hearing grossly intact.  NECK: Supple.  No apparent JVD.  RESP:  No IWOB.  Fair aeration bilaterally. CVS:  RRR. Heart sounds normal.  ABD/GI/GU: BS+. Abd soft, NTND.  MSK/EXT:  Moves extremities. No apparent deformity.  2+ BLE edema.  Some erythema bilaterally. SKIN: Mild BLE skin erythema bilaterally but no increased warmth to touch. NEURO: Awake, alert and oriented appropriately.  No apparent focal neuro deficit.  Noted tremor. PSYCH: Calm. Normal affect.   Procedures:  None  Microbiology summarized: COVID-19, influenza and  RSV PCR nonreactive Blood cultures NGTD  Assessment and plan: Principal Problem:   SIRS (systemic inflammatory response syndrome) (HCC) Active Problems:   HTN (hypertension)   HLD (hyperlipidemia)   BPH (benign prostatic hyperplasia)   Persistent atrial fibrillation (HCC)   Hypothermia   Ambulatory dysfunction   Chronic diastolic CHF (congestive heart failure) (HCC)  Hypotension/hypothermia/SIRS: CT angio chest negative for PE but raises concern for bronchopneumonia/aspiration.  He has large hiatal hernia which could increase his risk of aspiration.  Sepsis physiology resolved.  Feels better today. -De-escalate antibiotic to ceftriaxone and Flagyl -SLP eval and aspiration precaution -Follow ACTH stimulation test but low suspicion. -Wean off midodrine as able.    Persistent atrial fibrillation: Rate controlled without medications. -Continue home Eliquis for anticoagulation -IV metoprolol as needed -Optimize electrolytes  Chronic diastolic CHF: TTE with LVEF of 50 to 55%, indeterminate DD, severe LAE and RAE, mild to moderate TR. appears euvolemic except for BLE edema which seems to be chronic.  Blood pressure improved. -Resume home Lasix -Strict intake and output, daily weights and renal functions  Normocytic anemia: Anemia panel with some degree of iron deficiency and folate deficiency.  Stable. Recent Labs    06/17/23 0933 08/12/23 2228 08/13/23 0432 08/15/23 0249 08/16/23 0307 08/18/23 0906 08/21/23 2015 08/22/23 0357 08/23/23 0939  HGB 14.5 12.1* 10.7* 11.0* 10.9* 10.9* 10.9* 9.7* 11.1*  -Iron and folic acid supplementation -Continue monitoring.      BPH without LUTS -Continue home Proscar -Resume home Cardura at lower dose   Ambulatory dysfunction: Patient reports gait instability -PT/OT.  Family interested in short term rehab.  TOC and therapy notified.   There is no height or weight on file to calculate BMI.  Pressure skin injury: POA Pressure Injury  08/13/23 Buttocks Right Unstageable - Full thickness tissue loss in which the base of the injury is covered by slough (yellow, tan, gray, green or brown) and/or eschar (tan, brown or black) in the wound bed. 1cm x 2 cm (Active)  08/13/23 1000  Location: Buttocks  Location Orientation: Right  Staging: Unstageable - Full thickness tissue loss in which the base of the injury is covered by slough (yellow, tan, gray, green or brown) and/or eschar (tan, brown or black) in the wound bed.  Wound Description (Comments): 1cm x 2 cm  Present on Admission: Yes  Dressing Type Foam - Lift dressing to assess site every shift 08/23/23 0800   DVT prophylaxis:   apixaban (ELIQUIS) tablet 5 mg  Code Status: DNR/DNI Family Communication: Updated patient's daughter-in-law at bedside Level of care: Telemetry Status is: Observation The patient will require care spanning > 2 midnights and should be moved to inpatient because: SIRS and possible bronchopneumonia/aspiration pneumonia   Final disposition: SNF Consultants:  None  35 minutes with more than 50% spent in reviewing records, counseling patient/family and coordinating care.   Sch Meds:  Scheduled Meds:  apixaban  5 mg Oral BID   Chlorhexidine Gluconate Cloth  6 each Topical Daily   dorzolamide  1 drop Both Eyes BID   And   timolol  1 drop Both Eyes BID   doxazosin  2 mg Oral Daily   ferrous sulfate  325 mg Oral BID WC   finasteride  5 mg Oral Daily   folic acid  1 mg Oral Daily   furosemide  40 mg Oral BID   latanoprost  1 drop Both Eyes QPM   midodrine  5 mg Oral TID WC   pravastatin  40 mg Oral Daily   Continuous Infusions:  sodium chloride     cefTRIAXone (ROCEPHIN)  IV Stopped (08/23/23 0940)   metronidazole 500 mg (08/23/23 1040)   PRN Meds:.sodium chloride, acetaminophen **OR** acetaminophen, HYDROcodone-acetaminophen, metoprolol tartrate, ondansetron **OR** ondansetron (ZOFRAN) IV, polyethylene glycol,  senna-docusate  Antimicrobials: Anti-infectives (From admission, onward)    Start     Dose/Rate Route Frequency Ordered Stop   08/23/23 1000  cefTRIAXone (ROCEPHIN) 2 g in sodium chloride 0.9 % 100 mL IVPB        2 g 200 mL/hr over 30 Minutes Intravenous Every 24 hours 08/23/23 0754 08/26/23 0959   08/23/23 0900  metroNIDAZOLE (FLAGYL) IVPB 500 mg        500 mg 100 mL/hr over 60 Minutes Intravenous Every 12 hours 08/23/23 0754 08/28/23 0859   08/23/23 0100  vancomycin (VANCOREADY) IVPB 1250 mg/250 mL  Status:  Discontinued        1,250 mg 166.7 mL/hr over 90 Minutes Intravenous Every 24 hours 08/22/23 0028 08/23/23 0754   08/22/23 0100  ceFEPIme (MAXIPIME) 2 g in sodium chloride 0.9 % 100 mL IVPB  Status:  Discontinued        2 g 200 mL/hr over 30 Minutes Intravenous Every 12 hours 08/22/23 0023 08/23/23 0754   08/22/23 0030  vancomycin (VANCOREADY) IVPB 1750 mg/350 mL        1,750 mg 175 mL/hr over 120 Minutes Intravenous  Once 08/22/23 0026 08/22/23 0247   08/21/23 2000  cefTRIAXone (ROCEPHIN) 2 g in sodium chloride 0.9 % 100 mL IVPB  Status:  Discontinued        2 g 200 mL/hr over 30 Minutes Intravenous Every 24 hours 08/21/23 1952 08/22/23 0023        I have personally reviewed the following labs and images: CBC: Recent Labs  Lab 08/18/23 0906 08/21/23 2015 08/22/23 0357 08/23/23 0939  WBC 4.1 3.7* 3.8* 4.0  NEUTROABS 2.8 2.8  --  2.5  HGB 10.9* 10.9* 9.7* 11.1*  HCT 34.4* 33.6* 29.8* 34.4*  MCV 96.6 94.1 93.1 95.8  PLT 110* 122* 112* 150   BMP &GFR Recent Labs  Lab 08/18/23 0906 08/21/23 2015 08/22/23 0055 08/22/23 0357 08/23/23 0939  NA 143 137  --  139 140  K 4.0 3.8  --  3.9 4.1  CL 112* 102  --  104 106  CO2 25 26  --  26 25  GLUCOSE 103* 120*  --  85 104*  BUN 32* 23  --  21 24*  CREATININE 1.17 0.98  --  1.01 1.05  CALCIUM 8.8* 8.5*  --  8.3* 8.4*  MG  --   --  1.7 1.8 2.1  PHOS  --   --  3.4 3.5 3.3   Estimated Creatinine Clearance: 46.2  mL/min (by C-G formula based on SCr of 1.05 mg/dL). Liver & Pancreas: Recent Labs  Lab 08/21/23 2015 08/22/23 0357 08/23/23 0939  AST 49* 46* 38  ALT 47* 44 44  ALKPHOS 66 60 63  BILITOT 0.4 0.7 0.7  PROT 6.2* 5.6* 6.1*  ALBUMIN 3.5 3.0* 3.2*   No results for input(s): "LIPASE", "AMYLASE" in the last 168 hours. Recent Labs  Lab 08/22/23 0055  AMMONIA 24   Diabetic: No results for input(s): "HGBA1C" in the last 72 hours. No results for input(s): "GLUCAP" in the last 168 hours. Cardiac Enzymes: Recent Labs  Lab 08/22/23 0055  CKTOTAL 68   No results for input(s): "PROBNP" in the last 8760 hours. Coagulation Profile: Recent Labs  Lab 08/21/23 2015  INR 1.3*   Thyroid Function Tests: Recent Labs    08/22/23 0100 08/22/23 0357  TSH  --  5.140*  FREET4 1.11  --    Lipid Profile: No results for input(s): "CHOL", "HDL", "LDLCALC", "TRIG", "CHOLHDL", "LDLDIRECT" in the last 72 hours. Anemia Panel: Recent Labs    08/22/23 0100 08/22/23 0357  VITAMINB12  --  495  FOLATE  --  4.2*  FERRITIN 47  --   TIBC 307  --   IRON 43*  --   RETICCTPCT  --  1.8   Urine analysis:    Component Value Date/Time   COLORURINE STRAW (A) 08/21/2023 1953   APPEARANCEUR CLEAR 08/21/2023 1953   LABSPEC 1.005 08/21/2023 1953   PHURINE 5.0 08/21/2023 1953   GLUCOSEU NEGATIVE 08/21/2023 1953   HGBUR MODERATE (A) 08/21/2023 1953   BILIRUBINUR NEGATIVE 08/21/2023 1953   KETONESUR NEGATIVE 08/21/2023 1953   PROTEINUR NEGATIVE 08/21/2023 1953   NITRITE NEGATIVE 08/21/2023 1953   LEUKOCYTESUR NEGATIVE 08/21/2023 1953   Sepsis Labs: Invalid input(s): "PROCALCITONIN", "LACTICIDVEN"  Microbiology: Recent Results (from the past 240 hour(s))  Blood Culture (routine x 2)     Status: None (Preliminary result)   Collection Time: 08/21/23  7:52 PM   Specimen: BLOOD LEFT ARM  Result Value Ref Range Status   Specimen Description   Final    BLOOD LEFT ARM Performed at [facility], [address] [place], [place] [address]    Special Requests   Final  BOTTLES DRAWN AEROBIC AND ANAEROBIC Blood Culture adequate volume Performed at [facility], [address], [place], [place] [address]    Culture   Final    NO GROWTH 2 DAYS Performed at [facility], [address] [place], [place] [address]    Report Status PENDING  Incomplete  Blood Culture (routine x 2)     Status: None (Preliminary result)   Collection Time: 08/21/23  8:15 PM   Specimen: Right Antecubital; Blood  Result Value Ref Range Status   Specimen Description   Final    RIGHT ANTECUBITAL Performed at [facility], [address] [place], [place] [address]    Special Requests   Final    BOTTLES DRAWN AEROBIC AND ANAEROBIC Blood Culture adequate volume Performed at [facility], [address] [place], [place] [address]    Culture   Final    NO GROWTH 2 DAYS Performed at [facility], [address], [place], [place] [address]    Report Status PENDING  Incomplete  Resp panel by RT-PCR (RSV, Flu A&B, Covid) Anterior Nasal Swab     Status: None   Collection Time: 08/21/23  8:30 PM   Specimen: Anterior Nasal Swab  Result Value Ref Range Status   SARS Coronavirus 2 by RT PCR NEGATIVE NEGATIVE Final    Comment: (NOTE) SARS-CoV-2 target nucleic acids are NOT DETECTED.  The SARS-CoV-2 RNA is generally detectable in upper respiratory specimens during the acute phase of infection. The lowest concentration of SARS-CoV-2 viral copies this assay can detect is 138 copies/mL. A negative result does not preclude SARS-Cov-2 infection and should not be used as the sole basis for treatment or other patient management decisions. A negative result may occur with  improper specimen collection/handling, submission of specimen other than nasopharyngeal swab, presence of viral mutation(s) within the areas targeted by this  assay, and inadequate number of viral copies(<138 copies/mL). A negative result must be combined with clinical observations, patient history, and epidemiological information. The expected result is Negative.  Fact Sheet for Patients:  BloggerCourse.com  Fact Sheet for Healthcare Providers:  SeriousBroker.it  This test is no t yet approved or cleared by the [place] FDA and  has been authorized for detection and/or diagnosis of SARS-CoV-2 by FDA under an Emergency Use Authorization (EUA). This EUA will remain  in effect (meaning this test can be used) for the duration of the COVID-19 declaration under Section 564(b)(1) of the Act, 21 U.S.C.section 360bbb-3(b)(1), unless the authorization is terminated  or revoked sooner.       Influenza A by PCR NEGATIVE NEGATIVE Final   Influenza B by PCR NEGATIVE NEGATIVE Final    Comment: (NOTE) The Xpert Xpress SARS-CoV-2/FLU/RSV plus assay is intended as an aid in the diagnosis of influenza from Nasopharyngeal swab specimens and should not be used as a sole basis for treatment. Nasal washings and aspirates are unacceptable for Xpert Xpress SARS-CoV-2/FLU/RSV testing.  Fact Sheet for Patients: BloggerCourse.com  Fact Sheet for Healthcare Providers: SeriousBroker.it  This test is not yet approved or cleared by the [place] FDA and has been authorized for detection and/or diagnosis of SARS-CoV-2 by FDA under an Emergency Use Authorization (EUA). This EUA will remain in effect (meaning this test can be used) for the duration of the COVID-19 declaration under Section 564(b)(1) of the Act, 21 U.S.C. section 360bbb-3(b)(1), unless the authorization is terminated or revoked.     Resp Syncytial Virus by  PCR NEGATIVE NEGATIVE Final    Comment: (NOTE) Fact Sheet for Patients: BloggerCourse.com  Fact Sheet for  Healthcare Providers: SeriousBroker.it  This test is not yet approved or cleared by the [place] FDA and has been authorized for detection and/or diagnosis of SARS-CoV-2 by FDA under an Emergency Use Authorization (EUA). This EUA will remain in effect (meaning this test can be used) for the duration of the COVID-19 declaration under Section 564(b)(1) of the Act, 21 U.S.C. section 360bbb-3(b)(1), unless the authorization is terminated or revoked.  Performed at [facility], [address], [place], [place] [address]   MRSA Next Gen by PCR, Nasal     Status: None   Collection Time: 08/22/23 12:18 AM   Specimen: Nasal Mucosa; Nasal Swab  Result Value Ref Range Status   MRSA by PCR Next Gen NOT DETECTED NOT DETECTED Final    Comment: (NOTE) The GeneXpert MRSA Assay (FDA approved for NASAL specimens only), is one component of a comprehensive MRSA colonization surveillance program. It is not intended to diagnose MRSA infection nor to guide or monitor treatment for MRSA infections. Test performance is not FDA approved in patients less than 82 years old. Performed at [facility], [address], [place], [place] [address]     Radiology Studies: No results found.    Hindy Perrault T. Dionisios Ricci [facility]  If 7PM-7AM, please contact night-coverage [email or web address] 08/23/2023, 12:03 PM

## 2023-08-23 NOTE — Progress Notes (Signed)
Several attempts were made to patient temperature. Oral temp was 94.5 then taken rectally 94.7. NP notified. No new orders at this time, but did recommend to increase temp in the room and place warm blankest on patient [place] in place charge nurse made aware.

## 2023-08-23 NOTE — Progress Notes (Signed)
Initial Nutrition Assessment  DOCUMENTATION CODES:   Not applicable  INTERVENTION:  - Liberalize to 2g sodium diet to allow more options given advanced age. - Boost Plus once daily, provides 360 kcal and 14 grams of protein. - Magic cup BID with meals, each supplement provides 290 kcal and 9 grams of protein - Encourage intake at all meals. - Monitor weight trends.   - No weight taken this admission, ordered.  NUTRITION DIAGNOSIS:   Increased nutrient needs related to acute illness as evidenced by estimated needs.  GOAL:   Patient will meet greater than or equal to 90% of their needs  MONITOR:   PO intake, Supplement acceptance, Weight trends, I & O's  REASON FOR ASSESSMENT:   Consult Assessment of nutrition requirement/status  ASSESSMENT:   89 y.o. male with PMH significant of BPH, HTN, HLD, and afib who presented with fatigue, weakness, and hypothermia.   Met with patient and daughter-in-law at bedside this AM.   Patient reports a UBW of 170# and denies any changes in weight over the past year until just recently when he has had weight gain from fluid. Per EMR, weight without significant changes over the past year. No weight taken this admission, ordered weight.   He endorses eating well at home with 3 meals a day and good appetite. Does not like Ensure as he reports it gives him diarrhea and doesn't drink any other supplements at home.   States his current appetite is good and patient documented to have had 100% of breakfast yesterday. No meal intakes documented since. RD observed breakfast tray in room of which patient ate around 75%. He is agreeable to try Boost Plus and Magic Cup during admission to support intake.   Patient previously ordered a Heart Healthy diet. Per discussion with MD, can liberalize to 2g sodium to allow more options given advanced age.   Of note, patient had SLP eval today and recommended regular diet with thin liquids. SLP signed  off.   Medications reviewed and include: 1mg  folic acid  Labs reviewed:  -   NUTRITION - FOCUSED PHYSICAL EXAM:  Flowsheet Row Most Recent Value  Orbital Region Mild depletion  Upper Arm Region No depletion  Thoracic and Lumbar Region No depletion  Buccal Region Mild depletion  Temple Region Mild depletion  Clavicle Bone Region Mild depletion  Clavicle and Acromion Bone Region Mild depletion  Scapular Bone Region Unable to assess  Dorsal Hand No depletion  Patellar Region No depletion  [edema]  Anterior Thigh Region No depletion  [edema]  Posterior Calf Region No depletion  [edema]  Edema (RD Assessment) Moderate  Hair Reviewed  Eyes Reviewed  Mouth Reviewed  Skin Reviewed  Nails Reviewed       Diet Order:   Diet Order             Diet 2 gram sodium Room service appropriate? Yes; Fluid consistency: Thin  Diet effective now                   EDUCATION NEEDS:  Education needs have been addressed  Skin:  Skin Assessment: Skin Integrity Issues: Skin Integrity Issues:: Unstageable Unstageable: Right buttocks  Last BM:  9/1  Height:  Ht Readings from Last 1 Encounters:  08/13/23 6' (1.829 m)   Weight:  Wt Readings from Last 1 Encounters:  08/13/23 81.7 kg   Ideal Body Weight:  80.91 kg  BMI:  There is no height or weight on file to calculate BMI.  Estimated Nutritional Needs:  Kcal:  1650-1800 kcals Protein:  75-90 grams Fluid:  >/= 1.7L    Shelle Iron RD, LDN For contact information, refer to [facility].

## 2023-08-23 NOTE — Progress Notes (Signed)
  Daily Progress Note   Patient Name: Barry Burgess       Date: 08/23/2023 DOB: 10-20-1926  Age: 89 y.o. MRN#: [id number] Attending Physician: Almon Hercules, MD Primary Care Physician: [name], MD Admit Date: 08/21/2023 Length of Stay: 0 days  Discussed care with primary hospitalist today. New PMT consult placed to assist with complex medical decision making. After EMR review and discussion, appears patient medically improving and plan is for patient to attend SNF for rehab. As goals for medical care are currently determined, will cancel inpatient PMT consult at this time. Will placed TOC consult to assist with outpatient home palliative medicine referral. Please reach out if acute PMT needs arise in the future. Thank you.   Palliative Care Provider PMT # [phone]

## 2023-08-24 DIAGNOSIS — R6510 Systemic inflammatory response syndrome (SIRS) of non-infectious origin without acute organ dysfunction: Secondary | ICD-10-CM | POA: Diagnosis not present

## 2023-08-24 DIAGNOSIS — T68XXXA Hypothermia, initial encounter: Secondary | ICD-10-CM | POA: Diagnosis not present

## 2023-08-24 DIAGNOSIS — R262 Difficulty in walking, not elsewhere classified: Secondary | ICD-10-CM | POA: Diagnosis not present

## 2023-08-24 DIAGNOSIS — I4819 Other persistent atrial fibrillation: Secondary | ICD-10-CM | POA: Diagnosis not present

## 2023-08-24 DIAGNOSIS — J180 Bronchopneumonia, unspecified organism: Secondary | ICD-10-CM | POA: Insufficient documentation

## 2023-08-24 LAB — RENAL FUNCTION PANEL
Albumin: 3 g/dL — ABNORMAL LOW (ref 3.5–5.0)
Anion gap: 8 (ref 5–15)
BUN: 26 mg/dL — ABNORMAL HIGH (ref 8–23)
CO2: 24 mmol/L (ref 22–32)
Calcium: 8.4 mg/dL — ABNORMAL LOW (ref 8.9–10.3)
Chloride: 107 mmol/L (ref 98–111)
Creatinine, Ser: 0.99 mg/dL (ref 0.61–1.24)
GFR, Estimated: >60 mL/min (ref >60)
Glucose, Bld: 116 mg/dL — ABNORMAL HIGH (ref 70–99)
Phosphorus: 3.2 mg/dL (ref 2.5–4.6)
Potassium: 3.6 mmol/L (ref 3.5–5.1)
Sodium: 139 mmol/L (ref 135–145)

## 2023-08-24 LAB — CBC
HCT: 35 % — ABNORMAL LOW (ref 39.0–52.0)
Hemoglobin: 11.1 g/dL — ABNORMAL LOW (ref 13.0–17.0)
MCH: 30.8 pg (ref 26.0–34.0)
MCHC: 31.7 g/dL (ref 30.0–36.0)
MCV: 97.2 fL (ref 80.0–100.0)
Platelets: 142 10*3/uL — ABNORMAL LOW (ref 150–400)
RBC: 3.6 MIL/uL — ABNORMAL LOW (ref 4.22–5.81)
RDW: 16.3 % — ABNORMAL HIGH (ref 11.5–15.5)
WBC: 4.1 10*3/uL (ref 4.0–10.5)
nRBC: 0 % (ref 0.0–0.2)

## 2023-08-24 LAB — MAGNESIUM: Magnesium: 2.1 mg/dL (ref 1.7–2.4)

## 2023-08-24 LAB — ACTH: C206 ACTH: 38 pg/mL (ref 7.2–63.3)

## 2023-08-24 MED ORDER — AMOXICILLIN-POT CLAVULANATE 875-125 MG PO TABS
1.0000 | ORAL_TABLET | Freq: Two times a day (BID) | ORAL | Status: DC
  Administered 2023-08-24 – 2023-08-25 (×3): 1 via ORAL
  Filled 2023-08-24 (×3): qty 1

## 2023-08-24 NOTE — Progress Notes (Signed)
Physical Therapy Treatment Patient Details Name: KHYON SEWER MRN: [id number] DOB: 28-Mar-1926 Today's Date: 08/24/2023   History of Present Illness Patient is a 89 year old male who presented with hypotension, weakness, and fatigue. Patient was admitted with hypothermia. Recent d/c from hospital on 8/28 for UTI, CAP and sepsis. PMH:HLD, BPH, bilateral lower extremity edema, a fib    PT Comments   Pt admitted with above diagnosis.  Pt currently with functional limitations due to the deficits listed below (see PT Problem List). PT returned in pm. Pt in bed and family had left hospital. Pt agreeable to therapy intervention. Pt required increased time and encouragement for performing supine to sit as independently as possible with CGA and cues, CGA for sit to stand  from elevated EOB, gait tasks in hallway 125 feet with RW and CGA, flexed posture and heavy reliance on B UE support with pt reporting he did not know he was capable of amb that far. No reports of pain, dizziness or SOB. Pt left seated in recliner and all needs in place. Pts family requesting SNF for short term rehab, however pt able to participate with PT in pm and demonstrated good progress toward personal and PT goals and required limited assist for all mobility tasks and will benefit from ongoing family support and [facility] services. Pt will benefit from acute skilled PT to increase their independence and safety with mobility to allow discharge.      If plan is discharge home, recommend the following: A little help with walking and/or transfers;A little help with bathing/dressing/bathroom;Assistance with cooking/housework;Assist for transportation;Help with stairs or ramp for entrance   Can travel by private vehicle     Yes  Equipment Recommendations  None recommended by PT    Recommendations for Other Services       Precautions / Restrictions Precautions Precautions: Fall Restrictions Weight Bearing Restrictions: No      Mobility  Bed Mobility Overal bed mobility: Needs Assistance Bed Mobility: Supine to Sit     Supine to sit: HOB elevated, Used rails, Contact guard     General bed mobility comments: increased time and cues    Transfers Overall transfer level: Needs assistance Equipment used: Rolling walker (2 wheels) Transfers: Sit to/from Stand Sit to Stand: Contact guard assist           General transfer comment: CGA for safety and lines, no physical assist required    Ambulation/Gait Ambulation/Gait assistance: Contact guard assist Gait Distance (Feet): 125 Feet Assistive device: Rolling walker (2 wheels) Gait Pattern/deviations: Decreased stride length, Trunk flexed, Step-to pattern Gait velocity: decresaed     General Gait Details: pt indicated he was suprised by how he was able to amb today, no reports of SOB, noted heavy reliance on B UE support at RW with B LE edema   Stairs             Wheelchair Mobility     Tilt Bed    Modified Rankin (Stroke Patients Only)       Balance Overall balance assessment: Needs assistance Sitting-balance support: Feet supported Sitting balance-Leahy Scale: Good     Standing balance support: Bilateral upper extremity supported, During functional activity, Reliant on assistive device for balance Standing balance-Leahy Scale: Poor                              Cognition Arousal: Alert Behavior During Therapy: WFL for tasks assessed/performed Overall Cognitive Status:  Within Functional Limits for tasks assessed                                          Exercises      General Comments        Pertinent Vitals/Pain Pain Assessment Pain Assessment: No/denies pain    Home Living Family/patient expects to be discharged to:: Private residence Living Arrangements: Children Available Help at Discharge: Family;Available 24 hours/day Type of Home: House Home Access: Ramped entrance        Home Layout: One level Home Equipment: Agricultural consultant (2 wheels);Shower seat;Wheelchair - manual;Toilet riser;Grab bars - tub/shower      Prior Function            PT Goals (current goals can now be found in the care plan section) Acute Rehab PT Goals Patient Stated Goal: to get back to walking PT Goal Formulation: With patient Time For Goal Achievement: 09/05/23 Potential to Achieve Goals: Fair Progress towards PT goals: Progressing toward goals    Frequency    Min 1X/week      PT Plan      Co-evaluation              AM-PAC PT "6 Clicks" Mobility   Outcome Measure  Help needed turning from your back to your side while in a flat bed without using bedrails?: None Help needed moving from lying on your back to sitting on the side of a flat bed without using bedrails?: A Little Help needed moving to and from a bed to a chair (including a wheelchair)?: A Little Help needed standing up from a chair using your arms (e.g., wheelchair or bedside chair)?: A Little Help needed to walk in hospital room?: A Little Help needed climbing 3-5 steps with a railing? : A Lot 6 Click Score: 18    End of Session Equipment Utilized During Treatment: Gait belt Activity Tolerance: Patient tolerated treatment well Patient left: in chair;with call bell/phone within reach;with chair alarm set Nurse Communication: Mobility status PT Visit Diagnosis: Adult, failure to thrive (R62.7);Muscle weakness (generalized) (M62.81);Difficulty in walking, not elsewhere classified (R26.2)     Time: 1610-9604 PT Time Calculation (min) (ACUTE ONLY): 32 min  Charges:    $Gait Training: 8-22 mins $Therapeutic Activity: 8-22 mins PT General Charges $$ ACUTE PT VISIT: 1 Visit                     Johnny Bridge, PT Acute Rehab    Jacqualyn Posey 08/24/2023, 4:47 PM

## 2023-08-24 NOTE — Progress Notes (Signed)
PT Cancellation Note  Patient Details Name: Barry DECLEENE MRN: [id number] DOB: 12-28-1925   Cancelled Treatment:    Reason Eval/Treat Not Completed: Fatigue/lethargy limiting ability to participate. Pt reports not having a good night last night. Family present in room and requested discussion with PT in hallway in regards to d/c planning. Pt previously in hospital and declined d/c to skilled < 3 hr/day and currently family is requesting for pt to transition to SNF for short term rehab. PT to return later in the day if schedule allows to engage pt with therapy intervention and will continue to follow acutely to further assess d/c recommendation.   Johnny Bridge, PT Acute Rehab   Jacqualyn Posey 08/24/2023, 11:33 AM

## 2023-08-24 NOTE — Progress Notes (Addendum)
PROGRESS NOTE  Barry Burgess XLK:[id number] DOB: 1926/07/30   PCP: [name], MD  Patient is from: Home.  DOA: 08/21/2023 LOS: 1  Chief complaints Chief Complaint  Patient presents with   Fatigue     Brief Narrative / Interim history: 89 year old M with PMH of diastolic CHF, A-fib, HTN, BPH, HLD, ambulatory dysfunction and recent hospitalization for pneumonia and UTI returning with excessive fatigue and admitted with working diagnosis of SIRS, hypotension and hypothermia to 92.3 F.  UA not suggesting UTI.  Cultures obtained.  Started on broad-spectrum antibiotics.  CTA chest negative for PE but large hiatal hernia and pulmonary parenchymal findings suspicious for bronchopneumonia and/or aspiration and trace cholecystic fluid.   Blood cultures NGTD.  Hypotension resolved.  Clinically improved.  Antibiotics de-escalated to p.o. Augmentin.  Had recurrence of mild hypothermia the night of 9/2-3.  Can be discharged home with home health on 9/4 if no further hypothermia.   Subjective: Seen and examined earlier this morning.  Had recurrence of mild hypothermia to 94.7 overnight.  Resolved with warm blankets and room temperature adjustment.  Feels tired.  Also reports poor appetite.  Reports urinating a lot after p.o. Lasix.  Patient's son and daughter-in-law at bedside.  Objective: Vitals:   08/24/23 0316 08/24/23 0500 08/24/23 0744 08/24/23 1303  BP: 125/65  109/64 112/60  Pulse: (!) 55  62 83  Resp: 18  20   Temp: (!) 97.4 F (36.3 C)  98 F (36.7 C) 97.7 F (36.5 C)  TempSrc: Oral  Oral Oral  SpO2: 99%  97% 96%  Weight:  86.2 kg      Examination:  GENERAL: No apparent distress.  Nontoxic. HEENT: MMM.  Vision and hearing grossly intact.  NECK: Supple.  No apparent JVD.  RESP:  No IWOB.  Fair aeration bilaterally. CVS: Irregular rhythm.  Normal rate.  Heart sounds normal.  ABD/GI/GU: BS+. Abd soft, NTND.  MSK/EXT:  Moves extremities. No apparent deformity.  2+ BLE edema.   Some erythema bilaterally. SKIN: Mild BLE skin erythema bilaterally but no increased warmth to touch. NEURO: Awake, alert and oriented appropriately.  No apparent focal neuro deficit.  Noted tremor. PSYCH: Calm. Normal affect.   Procedures:  None  Microbiology summarized: COVID-19, influenza and RSV PCR nonreactive Blood cultures NGTD  Assessment and plan: Principal Problem:   Severe sepsis (HCC) Active Problems:   HTN (hypertension)   HLD (hyperlipidemia)   BPH (benign prostatic hyperplasia)   Persistent atrial fibrillation (HCC)   Hypothermia   Ambulatory dysfunction   Chronic diastolic CHF (congestive heart failure) (HCC)   Bronchopneumonia  Severe sepsis with bronchopneumonia, hypotension and hypothermia: POA.  CT angio chest negative for PE but raises concern for bronchopneumonia/aspiration.  He has large hiatal hernia which could increase his risk of aspiration.  Sepsis physiology resolved except for mild recurrent hypothermia.  Unclear if his hypothermia is due to infection or age or autonomic dysregulation.  He has tremors but no diagnosis of Parkinson's.  He is cortisol level is normal.  I do not see contributing meds. -Change antibiotic to p.o. Augmentin -Wean off midodrine as able    Persistent atrial fibrillation: Rate controlled without medications. -Continue home Eliquis for anticoagulation -IV metoprolol as needed -Optimize electrolytes  Chronic diastolic CHF: TTE with LVEF of 50 to 55%, indeterminate DD, severe LAE and RAE, mild to moderate TR. appears euvolemic except for BLE edema which seems to be chronic.  Blood pressure improved.  Lasix resumed.  Reports excellent urine output.  Edema improving. -Continue home Lasix. -Strict intake and output, daily weights and renal functions  Normocytic anemia: Anemia panel with some degree of iron deficiency and folate deficiency.  Stable. Recent Labs    06/17/23 0933 08/12/23 2228 08/13/23 0432 08/15/23 0249  08/16/23 0307 08/18/23 0906 08/21/23 2015 08/22/23 0357 08/23/23 0939 08/24/23 0514  HGB 14.5 12.1* 10.7* 11.0* 10.9* 10.9* 10.9* 9.7* 11.1* 11.1*  -Iron and folic acid supplementation -Continue monitoring.  BPH without LUTS -Continue home Proscar -Continue home Cardura at reduced dose.   Ambulatory dysfunction: Patient reports gait instability -PT/OT recommend home health.  Increased nutrient needs Body mass index is 25.77 kg/m. Nutrition Problem: Increased nutrient needs Etiology: acute illness Signs/Symptoms: estimated needs Interventions: Refer to RD note for recommendations, Boost Plus, Magic cup, Liberalize Diet    DVT prophylaxis:   apixaban (ELIQUIS) tablet 5 mg  Code Status: DNR/DNI Family Communication: Updated patient's son and daughter-in-law at bedside Level of care: Telemetry Status is: Inpatient The patient will remain inpatient because: SIRS and possible bronchopneumonia/aspiration pneumonia   Final disposition: Home with home health in the next 24 to 48 hours. Consultants:  None  35 minutes with more than 50% spent in reviewing records, counseling patient/family and coordinating care.   Sch Meds:  Scheduled Meds:  amoxicillin-clavulanate  1 tablet Oral Q12H   apixaban  5 mg Oral BID   dorzolamide  1 drop Both Eyes BID   And   timolol  1 drop Both Eyes BID   doxazosin  2 mg Oral Daily   ferrous sulfate  325 mg Oral BID WC   finasteride  5 mg Oral Daily   folic acid  1 mg Oral Daily   furosemide  40 mg Oral BID   lactose free nutrition  237 mL Oral Q24H   latanoprost  1 drop Both Eyes QPM   midodrine  5 mg Oral TID WC   pravastatin  40 mg Oral Daily   Continuous Infusions:  sodium chloride     PRN Meds:.sodium chloride, acetaminophen **OR** acetaminophen, HYDROcodone-acetaminophen, metoprolol tartrate, ondansetron **OR** ondansetron (ZOFRAN) IV, polyethylene glycol, senna-docusate  Antimicrobials: Anti-infectives (From admission,  onward)    Start     Dose/Rate Route Frequency Ordered Stop   08/24/23 1000  amoxicillin-clavulanate (AUGMENTIN) 875-125 MG per tablet 1 tablet        1 tablet Oral Every 12 hours 08/24/23 0822 08/28/23 0959   08/23/23 1000  cefTRIAXone (ROCEPHIN) 2 g in sodium chloride 0.9 % 100 mL IVPB  Status:  Discontinued        2 g 200 mL/hr over 30 Minutes Intravenous Every 24 hours 08/23/23 0754 08/24/23 0822   08/23/23 0900  metroNIDAZOLE (FLAGYL) IVPB 500 mg  Status:  Discontinued        500 mg 100 mL/hr over 60 Minutes Intravenous Every 12 hours 08/23/23 0754 08/24/23 0822   08/23/23 0100  vancomycin (VANCOREADY) IVPB 1250 mg/250 mL  Status:  Discontinued        1,250 mg 166.7 mL/hr over 90 Minutes Intravenous Every 24 hours 08/22/23 0028 08/23/23 0754   08/22/23 0100  ceFEPIme (MAXIPIME) 2 g in sodium chloride 0.9 % 100 mL IVPB  Status:  Discontinued        2 g 200 mL/hr over 30 Minutes Intravenous Every 12 hours 08/22/23 0023 08/23/23 0754   08/22/23 0030  vancomycin (VANCOREADY) IVPB 1750 mg/350 mL        1,750 mg 175 mL/hr over 120 Minutes Intravenous  Once 08/22/23  4010 08/22/23 0247   08/21/23 2000  cefTRIAXone (ROCEPHIN) 2 g in sodium chloride 0.9 % 100 mL IVPB  Status:  Discontinued        2 g 200 mL/hr over 30 Minutes Intravenous Every 24 hours 08/21/23 1952 08/22/23 0023        I have personally reviewed the following labs and images: CBC: Recent Labs  Lab 08/18/23 0906 08/21/23 2015 08/22/23 0357 08/23/23 0939 08/24/23 0514  WBC 4.1 3.7* 3.8* 4.0 4.1  NEUTROABS 2.8 2.8  --  2.5  --   HGB 10.9* 10.9* 9.7* 11.1* 11.1*  HCT 34.4* 33.6* 29.8* 34.4* 35.0*  MCV 96.6 94.1 93.1 95.8 97.2  PLT 110* 122* 112* 150 142*   BMP &GFR Recent Labs  Lab 08/18/23 0906 08/21/23 2015 08/22/23 0055 08/22/23 0357 08/23/23 0939 08/24/23 0514  NA 143 137  --  139 140 139  K 4.0 3.8  --  3.9 4.1 3.6  CL 112* 102  --  104 106 107  CO2 25 26  --  26 25 24   GLUCOSE 103* 120*  --  85  104* 116*  BUN 32* 23  --  21 24* 26*  CREATININE 1.17 0.98  --  1.01 1.05 0.99  CALCIUM 8.8* 8.5*  --  8.3* 8.4* 8.4*  MG  --   --  1.7 1.8 2.1 2.1  PHOS  --   --  3.4 3.5 3.3 3.2   Estimated Creatinine Clearance: 49 mL/min (by C-G formula based on SCr of 0.99 mg/dL). Liver & Pancreas: Recent Labs  Lab 08/21/23 2015 08/22/23 0357 08/23/23 0939 08/24/23 0514  AST 49* 46* 38  --   ALT 47* 44 44  --   ALKPHOS 66 60 63  --   BILITOT 0.4 0.7 0.7  --   PROT 6.2* 5.6* 6.1*  --   ALBUMIN 3.5 3.0* 3.2* 3.0*   No results for input(s): "LIPASE", "AMYLASE" in the last 168 hours. Recent Labs  Lab 08/22/23 0055  AMMONIA 24   Diabetic: No results for input(s): "HGBA1C" in the last 72 hours. No results for input(s): "GLUCAP" in the last 168 hours. Cardiac Enzymes: Recent Labs  Lab 08/22/23 0055  CKTOTAL 68   No results for input(s): "PROBNP" in the last 8760 hours. Coagulation Profile: Recent Labs  Lab 08/21/23 2015  INR 1.3*   Thyroid Function Tests: Recent Labs    08/22/23 0100 08/22/23 0357  TSH  --  5.140*  FREET4 1.11  --    Lipid Profile: No results for input(s): "CHOL", "HDL", "LDLCALC", "TRIG", "CHOLHDL", "LDLDIRECT" in the last 72 hours. Anemia Panel: Recent Labs    08/22/23 0100 08/22/23 0357  VITAMINB12  --  495  FOLATE  --  4.2*  FERRITIN 47  --   TIBC 307  --   IRON 43*  --   RETICCTPCT  --  1.8   Urine analysis:    Component Value Date/Time   COLORURINE STRAW (A) 08/21/2023 1953   APPEARANCEUR CLEAR 08/21/2023 1953   LABSPEC 1.005 08/21/2023 1953   PHURINE 5.0 08/21/2023 1953   GLUCOSEU NEGATIVE 08/21/2023 1953   HGBUR MODERATE (A) 08/21/2023 1953   BILIRUBINUR NEGATIVE 08/21/2023 1953   KETONESUR NEGATIVE 08/21/2023 1953   PROTEINUR NEGATIVE 08/21/2023 1953   NITRITE NEGATIVE 08/21/2023 1953   LEUKOCYTESUR NEGATIVE 08/21/2023 1953   Sepsis Labs: Invalid input(s): "PROCALCITONIN", "LACTICIDVEN"  Microbiology: Recent Results (from the  past 240 hour(s))  Blood Culture (routine x 2)  Status: None (Preliminary result)   Collection Time: 08/21/23  7:52 PM   Specimen: BLOOD LEFT ARM  Result Value Ref Range Status   Specimen Description   Final    BLOOD LEFT ARM Performed at [facility], [address], [place], [place] [address]    Special Requests   Final    BOTTLES DRAWN AEROBIC AND ANAEROBIC Blood Culture adequate volume Performed at [facility], [address], [place], [place] [address]    Culture   Final    NO GROWTH 3 DAYS Performed at [facility], [address], [place], [place] [address]    Report Status PENDING  Incomplete  Blood Culture (routine x 2)     Status: None (Preliminary result)   Collection Time: 08/21/23  8:15 PM   Specimen: Right Antecubital; Blood  Result Value Ref Range Status   Specimen Description   Final    RIGHT ANTECUBITAL Performed at [facility], [address], [place], [place] [address]    Special Requests   Final    BOTTLES DRAWN AEROBIC AND ANAEROBIC Blood Culture adequate volume Performed at [facility], [address], [place], [place] [address]    Culture   Final    NO GROWTH 3 DAYS Performed at [facility], [address] [place], [place] [address]    Report Status PENDING  Incomplete  Resp panel by RT-PCR (RSV, Flu A&B, Covid) Anterior Nasal Swab     Status: None   Collection Time: 08/21/23  8:30 PM   Specimen: Anterior Nasal Swab  Result Value Ref Range Status   SARS Coronavirus 2 by RT PCR NEGATIVE NEGATIVE Final    Comment: (NOTE) SARS-CoV-2 target nucleic acids are NOT DETECTED.  The SARS-CoV-2 RNA is generally detectable in upper respiratory specimens during the acute phase of infection. The lowest concentration of SARS-CoV-2 viral copies this assay can detect is 138 copies/mL. A negative result does not preclude SARS-Cov-2 infection and should not be used as the  sole basis for treatment or other patient management decisions. A negative result may occur with  improper specimen collection/handling, submission of specimen other than nasopharyngeal swab, presence of viral mutation(s) within the areas targeted by this assay, and inadequate number of viral copies(<138 copies/mL). A negative result must be combined with clinical observations, patient history, and epidemiological information. The expected result is Negative.  Fact Sheet for Patients:  BloggerCourse.com  Fact Sheet for Healthcare Providers:  SeriousBroker.it  This test is no t yet approved or cleared by the [place] FDA and  has been authorized for detection and/or diagnosis of SARS-CoV-2 by FDA under an Emergency Use Authorization (EUA). This EUA will remain  in effect (meaning this test can be used) for the duration of the COVID-19 declaration under Section 564(b)(1) of the Act, 21 U.S.C.section 360bbb-3(b)(1), unless the authorization is terminated  or revoked sooner.       Influenza A by PCR NEGATIVE NEGATIVE Final   Influenza B by PCR NEGATIVE NEGATIVE Final    Comment: (NOTE) The Xpert Xpress SARS-CoV-2/FLU/RSV plus assay is intended as an aid in the diagnosis of influenza from Nasopharyngeal swab specimens and should not be used as a sole basis for treatment. Nasal washings and aspirates are unacceptable for Xpert Xpress SARS-CoV-2/FLU/RSV testing.  Fact Sheet for Patients: BloggerCourse.com  Fact Sheet for Healthcare Providers: SeriousBroker.it  This test is not yet approved or cleared by the [place] and  has been authorized for detection and/or diagnosis of SARS-CoV-2 by FDA under an Emergency Use Authorization (EUA). This EUA will remain in effect (meaning this test can be used) for the duration of the COVID-19 declaration under Section 564(b)(1) of the  Act, 21 U.S.C. section 360bbb-3(b)(1), unless the authorization is terminated or revoked.     Resp Syncytial Virus by PCR NEGATIVE NEGATIVE Final    Comment: (NOTE) Fact Sheet for Patients: BloggerCourse.com  Fact Sheet for Healthcare Providers: SeriousBroker.it  This test is not yet approved or cleared by the [place] FDA and has been authorized for detection and/or diagnosis of SARS-CoV-2 by FDA under an Emergency Use Authorization (EUA). This EUA will remain in effect (meaning this test can be used) for the duration of the COVID-19 declaration under Section 564(b)(1) of the Act, 21 U.S.C. section 360bbb-3(b)(1), unless the authorization is terminated or revoked.  Performed at [facility], [address], [place], [place] [address]   MRSA Next Gen by PCR, Nasal     Status: None   Collection Time: 08/22/23 12:18 AM   Specimen: Nasal Mucosa; Nasal Swab  Result Value Ref Range Status   MRSA by PCR Next Gen NOT DETECTED NOT DETECTED Final    Comment: (NOTE) The GeneXpert MRSA Assay (FDA approved for NASAL specimens only), is one component of a comprehensive MRSA colonization surveillance program. It is not intended to diagnose MRSA infection nor to guide or monitor treatment for MRSA infections. Test performance is not FDA approved in patients less than 53 years old. Performed at [facility], [address] [place], [place] [address]     Radiology Studies: No results found.    Niklaus Mamaril T. Milka Windholz [facility]  If 7PM-7AM, please contact night-coverage [email or web address] 08/24/2023, 7:32 PM

## 2023-08-25 DIAGNOSIS — R6520 Severe sepsis without septic shock: Secondary | ICD-10-CM | POA: Diagnosis not present

## 2023-08-25 DIAGNOSIS — A419 Sepsis, unspecified organism: Secondary | ICD-10-CM

## 2023-08-25 LAB — RENAL FUNCTION PANEL
Albumin: 2.9 g/dL — ABNORMAL LOW (ref 3.5–5.0)
Anion gap: 10 (ref 5–15)
BUN: 29 mg/dL — ABNORMAL HIGH (ref 8–23)
CO2: 27 mmol/L (ref 22–32)
Calcium: 7.9 mg/dL — ABNORMAL LOW (ref 8.9–10.3)
Chloride: 101 mmol/L (ref 98–111)
Creatinine, Ser: 1.4 mg/dL — ABNORMAL HIGH (ref 0.61–1.24)
GFR, Estimated: 46 mL/min — ABNORMAL LOW (ref >60)
Glucose, Bld: 103 mg/dL — ABNORMAL HIGH (ref 70–99)
Phosphorus: 3.7 mg/dL (ref 2.5–4.6)
Potassium: 3.6 mmol/L (ref 3.5–5.1)
Sodium: 138 mmol/L (ref 135–145)

## 2023-08-25 LAB — CBC
HCT: 30.9 % — ABNORMAL LOW (ref 39.0–52.0)
Hemoglobin: 10 g/dL — ABNORMAL LOW (ref 13.0–17.0)
MCH: 30.6 pg (ref 26.0–34.0)
MCHC: 32.4 g/dL (ref 30.0–36.0)
MCV: 94.5 fL (ref 80.0–100.0)
Platelets: 142 10*3/uL — ABNORMAL LOW (ref 150–400)
RBC: 3.27 MIL/uL — ABNORMAL LOW (ref 4.22–5.81)
RDW: 16.5 % — ABNORMAL HIGH (ref 11.5–15.5)
WBC: 4.3 10*3/uL (ref 4.0–10.5)
nRBC: 0 % (ref 0.0–0.2)

## 2023-08-25 LAB — MAGNESIUM: Magnesium: 2.1 mg/dL (ref 1.7–2.4)

## 2023-08-25 MED ORDER — FERROUS SULFATE 325 (65 FE) MG PO TABS: 325.0000 mg | ORAL_TABLET | Freq: Two times a day (BID) | ORAL | 0 refills | Status: DC

## 2023-08-25 MED ORDER — FOLIC ACID 1 MG PO TABS: 1.0000 mg | ORAL_TABLET | Freq: Every day | ORAL | 0 refills | Status: DC

## 2023-08-25 MED ORDER — ACETAMINOPHEN 325 MG PO TABS: 650.0000 mg | ORAL_TABLET | Freq: Four times a day (QID) | ORAL | 0 refills | Status: DC | PRN

## 2023-08-25 MED ORDER — AMOXICILLIN-POT CLAVULANATE 875-125 MG PO TABS: 1.0000 | ORAL_TABLET | Freq: Two times a day (BID) | ORAL | 0 refills | Status: DC

## 2023-08-25 MED ORDER — MIDODRINE HCL 5 MG PO TABS: 5.0000 mg | ORAL_TABLET | Freq: Three times a day (TID) | ORAL | 0 refills | Status: DC

## 2023-08-25 NOTE — Discharge Summary (Signed)
Physician Discharge Summary  Barry Burgess:[id number] DOB: March 19, 1926 DOA: 08/21/2023  PCP: Barry Burgess  Admit date: 08/21/2023 Discharge date: 08/25/2023  Admitted From: Home Disposition:  Home  Recommendations for Outpatient Follow-up:  Follow up with PCP in 1-2 weeks Please obtain BMP/CBC in one week Please follow up on the following pending results:  Home Health:PT  Equipment/Devices:None  Discharge Condition:Stable  CODE STATUS: DNR  Diet recommendation: Regular   Brief/Interim Summary: 89 year old M with PMH of diastolic CHF, A-fib, HTN, BPH, HLD, ambulatory dysfunction and recent hospitalization for pneumonia and UTI returning with excessive fatigue and admitted with working diagnosis of SIRS given hypotension and hypothermia to 92.3 F.  UA not suggesting UTI.  Cultures obtained.  Started on broad-spectrum antibiotics.  CTA chest negative for PE but large hiatal hernia and pulmonary parenchymal findings suspicious for bronchopneumonia and/or aspiration and trace cholecystic fluid.    Blood cultures NGTD.  Hypotension resolved.  Clinically improved.  Antibiotics de-escalated to p.o. Augmentin.  Had recurrence of mild hypothermia the night of 9/2-3 but continues to be without overt symptoms. Discharged home with home health on 9/4 given no further hypothermia.  Discharge Diagnoses:  Principal Problem:   Severe sepsis (HCC) Active Problems:   HTN (hypertension)   HLD (hyperlipidemia)   BPH (benign prostatic hyperplasia)   Persistent atrial fibrillation (HCC)   Hypothermia   Ambulatory dysfunction   Chronic diastolic CHF (congestive heart failure) (HCC)   Bronchopneumonia  Discharge Instructions   Allergies as of 08/25/2023   No Known Allergies      Medication List     TAKE these medications    acetaminophen 325 MG tablet Commonly known as: TYLENOL Take 2 tablets (650 mg total) by mouth every 6 (six) hours as needed for mild pain (or Fever >/= 101).    amoxicillin-clavulanate 875-125 MG tablet Commonly known as: AUGMENTIN Take 1 tablet by mouth every 12 (twelve) hours.   dorzolamide-timolol 2-0.5 % ophthalmic solution Commonly known as: COSOPT Place 1 drop into both eyes 2 (two) times daily.   doxazosin 4 MG tablet Commonly known as: CARDURA Take 4 mg by mouth daily.   Eliquis 5 MG Tabs tablet Generic drug: apixaban TAKE 1 TABLET BY MOUTH TWICE  DAILY   ferrous sulfate 325 (65 FE) MG tablet Take 1 tablet (325 mg total) by mouth 2 (two) times daily with a meal.   finasteride 5 MG tablet Commonly known as: PROSCAR Take 5 mg by mouth daily.   folic acid 1 MG tablet Commonly known as: FOLVITE Take 1 tablet (1 mg total) by mouth daily. Start taking on: August 26, 2023   furosemide 40 MG tablet Commonly known as: LASIX Take 1 tablet (40 mg total) by mouth 2 (two) times daily.   latanoprost 0.005 % ophthalmic solution Commonly known as: XALATAN Place 1 drop into both eyes every evening.   midodrine 5 MG tablet Commonly known as: PROAMATINE Take 1 tablet (5 mg total) by mouth 3 (three) times daily with meals.   pravastatin 40 MG tablet Commonly known as: PRAVACHOL Take 40 mg by mouth daily.        Follow-up Information     [facility] Follow up.   Why: [facility] will contact you to set up the visit for home health physical therapy, occupational therapy and a Child psychotherapist.  Bjorn Loser [phone] is the the nurse for any admissions questions.  The office number is [phone] if you need anything else.  No Known Allergies  Consultations: None   Procedures/Studies: ECHOCARDIOGRAM COMPLETE  Result Date: 08/22/2023    ECHOCARDIOGRAM REPORT   Patient Name:   Barry Burgess Date of Exam: 08/22/2023 Medical Rec #:  [id number]         Height:       72.0 in Accession #:    [id number]        Weight:       180.1 lb Date of Birth:  [birth year]          BSA:          2.038 m Patient Age:     89 years          BP:           105/52 mmHg Patient Gender: M                 HR:           65 bpm. Exam Location:  Inpatient Procedure: 2D Echo, Color Doppler and Cardiac Doppler Indications:    Cardiomegaly  History:        Patient has prior history of Echocardiogram examinations, most                 recent 05/12/2022. CHF and Cardiomegaly, Arrythmias:Atrial                 Fibrillation; Risk Factors:Hypertension and Dyslipidemia.  Sonographer:    Milbert Coulter Referring Phys: [id number] Barry Burgess IMPRESSIONS  1. Left ventricular ejection fraction, by estimation, is 50 to 55%. The left ventricle has low normal function. The left ventricle has no regional wall motion abnormalities. Indeterminate diastolic filling due to E-A fusion.  2. Right ventricular systolic function is mildly reduced. The right ventricular size is moderately enlarged.  3. Left atrial size was severely dilated.  4. Right atrial size was severely dilated.  5. The mitral valve was not well visualized. Mild mitral valve regurgitation.  6. Tricuspid valve regurgitation is mild to moderate.  7. Aortic valve regurgitation is mild. Aortic valve sclerosis is present, with no evidence of aortic valve stenosis. Comparison(s): No significant change from prior study. FINDINGS  Left Ventricle: Left ventricular ejection fraction, by estimation, is 50 to 55%. The left ventricle has low normal function. The left ventricle has no regional wall motion abnormalities. The left ventricular internal cavity size was normal in size. There is no left ventricular hypertrophy. Indeterminate diastolic filling due to E-A fusion. Right Ventricle: The right ventricular size is moderately enlarged. Right ventricular systolic function is mildly reduced. Left Atrium: Left atrial size was severely dilated. Right Atrium: Right atrial size was severely dilated. Pericardium: Trivial pericardial effusion is present. Mitral Valve: The mitral valve was not well visualized. Mild  mitral valve regurgitation. Tricuspid Valve: Tricuspid valve regurgitation is mild to moderate. Aortic Valve: Aortic valve regurgitation is mild. Aortic valve sclerosis is present, with no evidence of aortic valve stenosis. Aortic valve mean gradient measures 3.0 mmHg. Aortic valve peak gradient measures 4.8 mmHg. Aortic valve area, by VTI measures  1.63 cm. Pulmonic Valve: Pulmonic valve regurgitation is not visualized. Aorta: The aortic root and ascending aorta are structurally normal, with no evidence of dilitation. IAS/Shunts: No atrial level shunt detected by color flow Doppler.  LEFT VENTRICLE PLAX 2D LVIDd:         4.10 cm   Diastology LVIDs:         2.80 cm   LV e' medial:    10.20 cm/s LV  PW:         1.10 cm   LV E/e' medial:  10.3 LV IVS:        1.00 cm   LV e' lateral:   8.49 cm/s LVOT diam:     1.90 cm   LV E/e' lateral: 12.4 LV SV:         43 LV SV Index:   21 LVOT Area:     2.84 cm  RIGHT VENTRICLE RV Basal diam:  4.00 cm RV Mid diam:    3.20 cm RV S prime:     7.83 cm/s TAPSE (M-mode): 1.4 cm LEFT ATRIUM             Index        RIGHT ATRIUM           Index LA diam:        5.40 cm 2.65 cm/m   RA Area:     28.60 cm LA Vol (A2C):   92.7 ml 45.49 ml/m  RA Volume:   101.00 ml 49.56 ml/m LA Vol (A4C):   98.2 ml 48.19 ml/m LA Biplane Vol: 97.9 ml 48.04 ml/m  AORTIC VALVE AV Area (Vmax):    1.71 cm AV Area (Vmean):   1.69 cm AV Area (VTI):     1.63 cm AV Vmax:           109.00 cm/s AV Vmean:          73.300 cm/s AV VTI:            0.261 m AV Peak Grad:      4.8 mmHg AV Mean Grad:      3.0 mmHg LVOT Vmax:         65.60 cm/s LVOT Vmean:        43.700 cm/s LVOT VTI:          0.150 m LVOT/AV VTI ratio: 0.57  AORTA Ao Root diam: 3.20 cm Ao Asc diam:  3.30 cm MITRAL VALVE                TRICUSPID VALVE MV Area (PHT): 5.02 cm     TR Peak grad:   24.6 mmHg MV Decel Time: 151 msec     TR Vmax:        248.00 cm/s MV E velocity: 105.00 cm/s MV A velocity: 48.40 cm/s   SHUNTS MV E/A ratio:  2.17          Systemic VTI:  0.15 m                             Systemic Diam: 1.90 cm Carolan Clines Electronically signed by Carolan Clines Signature Date/Time: 08/22/2023/12:26:25 PM    Final    CT Angio Chest Pulmonary Embolism (PE) W or WO Contrast  Result Date: 08/22/2023 CLINICAL DATA:  Not feeling Better from a few days ago. Continued fatigued and weakness. Pulmonary embolism suspected. EXAM: CT ANGIOGRAPHY CHEST CT ABDOMEN AND PELVIS WITH CONTRAST TECHNIQUE: Multidetector CT imaging of the chest was performed using the standard protocol during bolus administration of intravenous contrast. Multiplanar CT image reconstructions and MIPs were obtained to evaluate the vascular anatomy. Multidetector CT imaging of the abdomen and pelvis was performed using the standard protocol during bolus administration of intravenous contrast. RADIATION DOSE REDUCTION: This exam was performed according to the departmental dose-optimization program which includes automated exposure control, adjustment of the mA and/or kV according to patient size  and/or use of iterative reconstruction technique. CONTRAST:  OMNIPAQUE IOHEXOL 350 MG/ML SOLN COMPARISON:  Abdominal radiograph 08/21/2023; chest radiograph 08/21/2023 FINDINGS: CTA CHEST FINDINGS Cardiovascular: Negative for acute pulmonary embolism. Cardiomegaly. No pericardial effusion. Aortic and coronary artery atherosclerotic calcification. Mediastinum/Nodes: Trachea is unremarkable. Large hiatal hernia with intrathoracic stomach. Prominent AP window node is likely reactive. Lungs/Pleura: Small right-greater-than-left pleural effusions and associated atelectasis. Peribronchovascular consolidation in the lower lobes. Posterior ground-glass opacities in the lingula. Scattered mucous plugging no pneumothorax. Musculoskeletal: No acute fracture. Review of the MIP images confirms the above findings. CT ABDOMEN and PELVIS FINDINGS Hepatobiliary: Hepatic cysts including a large cyst in the posterior  right hepatic lobe. Mild gallbladder wall hyperemia. Trace pericholecystic fluid. No biliary dilation or radiopaque stone. Pancreas: Unremarkable. Spleen: Unremarkable. Adrenals/Urinary Tract: Unremarkable adrenal glands. No urinary calculi or hydronephrosis. Unremarkable bladder. Stomach/Bowel: Normal caliber large and small bowel. Colonic diverticulosis without diverticulitis. Diverticulosis of the terminal ileum. Normal appendix. Large hiatal hernia with intrathoracic stomach. Vascular/Lymphatic: Aortic atherosclerosis. No enlarged abdominal or pelvic lymph nodes. Reproductive: Enlarged prostate. Other: No free intraperitoneal air. Musculoskeletal: No acute fracture. Review of the MIP images confirms the above findings. IMPRESSION: 1. Negative for acute pulmonary embolism. 2. Pulmonary parenchymal findings suspicious for bronchopneumonia and/or aspiration. 3. Small right-greater-than-left pleural effusions and associated atelectasis. 4. Trace pericholecystic fluid may be related to volume status/CHF. If there is concern for cholecystitis consider right upper quadrant ultrasound. 5. Large hiatal hernia with intrathoracic stomach. Aortic Atherosclerosis (ICD10-I70.0). Electronically Signed   By: Minerva Fester M.D.   On: 08/22/2023 02:15   CT ABDOMEN PELVIS W CONTRAST  Result Date: 08/22/2023 CLINICAL DATA:  Not feeling Better from a few days ago. Continued fatigued and weakness. Pulmonary embolism suspected. EXAM: CT ANGIOGRAPHY CHEST CT ABDOMEN AND PELVIS WITH CONTRAST TECHNIQUE: Multidetector CT imaging of the chest was performed using the standard protocol during bolus administration of intravenous contrast. Multiplanar CT image reconstructions and MIPs were obtained to evaluate the vascular anatomy. Multidetector CT imaging of the abdomen and pelvis was performed using the standard protocol during bolus administration of intravenous contrast. RADIATION DOSE REDUCTION: This exam was performed according to  the departmental dose-optimization program which includes automated exposure control, adjustment of the mA and/or kV according to patient size and/or use of iterative reconstruction technique. CONTRAST:  OMNIPAQUE IOHEXOL 350 MG/ML SOLN COMPARISON:  Abdominal radiograph 08/21/2023; chest radiograph 08/21/2023 FINDINGS: CTA CHEST FINDINGS Cardiovascular: Negative for acute pulmonary embolism. Cardiomegaly. No pericardial effusion. Aortic and coronary artery atherosclerotic calcification. Mediastinum/Nodes: Trachea is unremarkable. Large hiatal hernia with intrathoracic stomach. Prominent AP window node is likely reactive. Lungs/Pleura: Small right-greater-than-left pleural effusions and associated atelectasis. Peribronchovascular consolidation in the lower lobes. Posterior ground-glass opacities in the lingula. Scattered mucous plugging no pneumothorax. Musculoskeletal: No acute fracture. Review of the MIP images confirms the above findings. CT ABDOMEN and PELVIS FINDINGS Hepatobiliary: Hepatic cysts including a large cyst in the posterior right hepatic lobe. Mild gallbladder wall hyperemia. Trace pericholecystic fluid. No biliary dilation or radiopaque stone. Pancreas: Unremarkable. Spleen: Unremarkable. Adrenals/Urinary Tract: Unremarkable adrenal glands. No urinary calculi or hydronephrosis. Unremarkable bladder. Stomach/Bowel: Normal caliber large and small bowel. Colonic diverticulosis without diverticulitis. Diverticulosis of the terminal ileum. Normal appendix. Large hiatal hernia with intrathoracic stomach. Vascular/Lymphatic: Aortic atherosclerosis. No enlarged abdominal or pelvic lymph nodes. Reproductive: Enlarged prostate. Other: No free intraperitoneal air. Musculoskeletal: No acute fracture. Review of the MIP images confirms the above findings. IMPRESSION: 1. Negative for acute pulmonary embolism. 2. Pulmonary parenchymal findings suspicious  for bronchopneumonia and/or aspiration. 3. Small  right-greater-than-left pleural effusions and associated atelectasis. 4. Trace pericholecystic fluid may be related to volume status/CHF. If there is concern for cholecystitis consider right upper quadrant ultrasound. 5. Large hiatal hernia with intrathoracic stomach. Aortic Atherosclerosis (ICD10-I70.0). Electronically Signed   By: Minerva Fester M.D.   On: 08/22/2023 02:15   DG Abd 1 View  Result Date: 08/22/2023 CLINICAL DATA:  Constipation EXAM: ABDOMEN - 1 VIEW COMPARISON:  None Available. FINDINGS: The bowel gas pattern is normal. No radio-opaque calculi or other significant radiographic abnormality are seen. No excessive stool burden. IMPRESSION: No acute findings. Electronically Signed   By: Charlett Nose M.D.   On: 08/22/2023 00:16   CT HEAD WO CONTRAST ( )  Result Date: 08/22/2023 CLINICAL DATA:  Altered mental status. EXAM: CT HEAD WITHOUT CONTRAST TECHNIQUE: Contiguous axial images were obtained from the base of the skull through the vertex without intravenous contrast. RADIATION DOSE REDUCTION: This exam was performed according to the departmental dose-optimization program which includes automated exposure control, adjustment of the mA and/or kV according to patient size and/or use of iterative reconstruction technique. COMPARISON:  08/12/2023 FINDINGS: Brain: Patchy low-density in the deep white matter, most pronounced in the frontal lobes, stable. No acute intracranial abnormality. Specifically, no hemorrhage, hydrocephalus, mass lesion, acute infarction, or significant intracranial injury. Vascular: No hyperdense vessel or unexpected calcification. Skull: No acute calvarial abnormality. Sinuses/Orbits: No acute findings Other: None IMPRESSION: No acute intracranial abnormality. Electronically Signed   By: Charlett Nose M.D.   On: 08/22/2023 00:16   DG Chest Port 1 View  Result Date: 08/21/2023 CLINICAL DATA:  Possible sepsis.  Continued weakness and fatigue. EXAM: PORTABLE CHEST 1 VIEW  COMPARISON:  08/17/2023. FINDINGS: Heart is enlarged and the mediastinal contour stable. A density containing air is noted in the retrocardiac space on the right, suggesting hiatal hernia. There is atherosclerotic calcification of the aorta. Patchy airspace disease is present at the right lung base. The left lung base is excluded from the field of view. There is a small right pleural effusion. No pneumothorax is seen. No acute osseous abnormality. IMPRESSION: 1. Small right pleural effusion with atelectasis or infiltrate. 2. Left lung base is excluded from the field of view. 3. Cardiomegaly. 4. Hiatal hernia. Electronically Signed   By: Thornell Sartorius M.D.   On: 08/21/2023 21:40   DG Chest 2 View  Result Date: 08/17/2023 CLINICAL DATA:  Follow-up pneumonia. Shortness of breath and weakness. EXAM: CHEST - 2 VIEW COMPARISON:  08/12/2023. FINDINGS: Examination is limited due to patient related factors. Redemonstration of left retrocardiac airspace opacity obscuring the left hemidiaphragm, descending thoracic aorta and blunting the left lateral costophrenic angle suggesting combination of left lower lobe atelectasis and/or consolidation with pleural effusion. No significant interval change. Bilateral lung fields are otherwise clear. There is small right pleural effusion, slightly increased since the prior study. Stable cardio-mediastinal silhouette. No acute osseous abnormalities. The soft tissues are within normal limits. IMPRESSION: 1. No significant interval change in left lower lobe atelectasis and/or consolidation with pleural effusion. 2. Small right pleural effusion, slightly increased since the prior study. Electronically Signed   By: Jules Schick M.D.   On: 08/17/2023 11:44   CT Head Wo Contrast  Result Date: 08/12/2023 CLINICAL DATA:  Mental status change, unknown cause EXAM: CT HEAD WITHOUT CONTRAST TECHNIQUE: Contiguous axial images were obtained from the base of the skull through the vertex without  intravenous contrast. RADIATION DOSE REDUCTION: This exam was performed according  to the departmental dose-optimization program which includes automated exposure control, adjustment of the mA and/or kV according to patient size and/or use of iterative reconstruction technique. COMPARISON:  None Available. FINDINGS: Brain: Patchy chronic small vessel disease throughout the deep white matter. No acute intracranial abnormality. Specifically, no hemorrhage, hydrocephalus, mass lesion, acute infarction, or significant intracranial injury. Vascular: No hyperdense vessel or unexpected calcification. Skull: No acute calvarial abnormality. Sinuses/Orbits: No acute findings Other: None IMPRESSION: No acute intracranial abnormality. Patchy chronic small vessel disease. Electronically Signed   By: Charlett Nose M.D.   On: 08/12/2023 23:33   DG Chest Port 1 View  Result Date: 08/12/2023 CLINICAL DATA:  Questionable sepsis EXAM: PORTABLE CHEST 1 VIEW COMPARISON:  Chest x-ray 05/11/2022 FINDINGS: Large hiatal hernia is again seen. The heart is enlarged, unchanged. There are small bilateral pleural effusions, left greater than right. There some infiltrates in the left lung base. Pneumothorax or acute fracture. IMPRESSION: 1. Cardiomegaly. 2. Small bilateral pleural effusions, left greater than right. 3. Infiltrate in the left lung base. 4. Large hiatal hernia. Electronically Signed   By: Darliss Cheney M.D.   On: 08/12/2023 23:28     Subjective: No acute issues/events overnight  Discharge Exam: Vitals:   08/25/23 0941 08/25/23 1147  BP: 114/66 105/67  Pulse:  60  Resp:  18  Temp:  98 F (36.7 C)  SpO2:  98%   Vitals:   08/24/23 2205 08/25/23 0541 08/25/23 0941 08/25/23 1147  BP: 125/67 116/69 114/66 105/67  Pulse: 64 75  60  Resp: 18 16  18   Temp: (!) 97.4 F (36.3 C) 97.6 F (36.4 C)  98 F (36.7 C)  TempSrc: Oral Oral    SpO2: 99% 95%  98%  Weight:        General: Pt is alert, awake, not in acute  distress Cardiovascular: RRR, S1/S2 +, no rubs, no gallops Respiratory: CTA bilaterally, no wheezing, no rhonchi Abdominal: Soft, NT, ND, bowel sounds + Extremities: no edema, no cyanosis    The results of significant diagnostics from this hospitalization (including imaging, microbiology, ancillary and laboratory) are listed below for reference.     Microbiology: Recent Results (from the past 240 hour(s))  Blood Culture (routine x 2)     Status: None (Preliminary result)   Collection Time: 08/21/23  7:52 PM   Specimen: BLOOD LEFT ARM  Result Value Ref Range Status   Specimen Description   Final    BLOOD LEFT ARM Performed at [facility], [address], [place], [place] [address]    Special Requests   Final    BOTTLES DRAWN AEROBIC AND ANAEROBIC Blood Culture adequate volume Performed at [facility], [address], [place], [place] [address]    Culture   Final    NO GROWTH 4 DAYS Performed at [facility], [address], [place], [place] [address]    Report Status PENDING  Incomplete  Blood Culture (routine x 2)     Status: None (Preliminary result)   Collection Time: 08/21/23  8:15 PM   Specimen: Right Antecubital; Blood  Result Value Ref Range Status   Specimen Description   Final    RIGHT ANTECUBITAL Performed at [facility], [address] [place], [place] [address]    Special Requests   Final    BOTTLES DRAWN AEROBIC AND ANAEROBIC Blood Culture adequate volume Performed at [facility], [address], [place], [place] [address]  Culture   Final    NO GROWTH 4 DAYS Performed at [facility], [address], [place], [place] [address]    Report Status PENDING  Incomplete  Resp panel by RT-PCR (RSV, Flu A&B, Covid) Anterior Nasal Swab     Status: None   Collection Time: 08/21/23  8:30 PM   Specimen: Anterior Nasal Swab  Result Value Ref Range Status   SARS Coronavirus  2 by RT PCR NEGATIVE NEGATIVE Final    Comment: (NOTE) SARS-CoV-2 target nucleic acids are NOT DETECTED.  The SARS-CoV-2 RNA is generally detectable in upper respiratory specimens during the acute phase of infection. The lowest concentration of SARS-CoV-2 viral copies this assay can detect is 138 copies/mL. A negative result does not preclude SARS-Cov-2 infection and should not be used as the sole basis for treatment or other patient management decisions. A negative result may occur with  improper specimen collection/handling, submission of specimen other than nasopharyngeal swab, presence of viral mutation(s) within the areas targeted by this assay, and inadequate number of viral copies(<138 copies/mL). A negative result must be combined with clinical observations, patient history, and epidemiological information. The expected result is Negative.  Fact Sheet for Patients:  BloggerCourse.com  Fact Sheet for Healthcare Providers:  SeriousBroker.it  This test is no t yet approved or cleared by the [place] FDA and  has been authorized for detection and/or diagnosis of SARS-CoV-2 by FDA under an Emergency Use Authorization (EUA). This EUA will remain  in effect (meaning this test can be used) for the duration of the COVID-19 declaration under Section 564(b)(1) of the Act, 21 U.S.C.section 360bbb-3(b)(1), unless the authorization is terminated  or revoked sooner.       Influenza A by PCR NEGATIVE NEGATIVE Final   Influenza B by PCR NEGATIVE NEGATIVE Final    Comment: (NOTE) The Xpert Xpress SARS-CoV-2/FLU/RSV plus assay is intended as an aid in the diagnosis of influenza from Nasopharyngeal swab specimens and should not be used as a sole basis for treatment. Nasal washings and aspirates are unacceptable for Xpert Xpress SARS-CoV-2/FLU/RSV testing.  Fact Sheet for Patients: BloggerCourse.com  Fact  Sheet for Healthcare Providers: SeriousBroker.it  This test is not yet approved or cleared by the [place] FDA and has been authorized for detection and/or diagnosis of SARS-CoV-2 by FDA under an Emergency Use Authorization (EUA). This EUA will remain in effect (meaning this test can be used) for the duration of the COVID-19 declaration under Section 564(b)(1) of the Act, 21 U.S.C. section 360bbb-3(b)(1), unless the authorization is terminated or revoked.     Resp Syncytial Virus by PCR NEGATIVE NEGATIVE Final    Comment: (NOTE) Fact Sheet for Patients: BloggerCourse.com  Fact Sheet for Healthcare Providers: SeriousBroker.it  This test is not yet approved or cleared by the [place] FDA and has been authorized for detection and/or diagnosis of SARS-CoV-2 by FDA under an Emergency Use Authorization (EUA). This EUA will remain in effect (meaning this test can be used) for the duration of the COVID-19 declaration under Section 564(b)(1) of the Act, 21 U.S.C. section 360bbb-3(b)(1), unless the authorization is terminated or revoked.  Performed at [facility], [address], [place], [place] [address]   MRSA Next Gen by PCR, Nasal     Status: None   Collection Time: 08/22/23 12:18 AM   Specimen: Nasal Mucosa; Nasal Swab  Result Value Ref Range Status   MRSA by PCR Next Gen NOT DETECTED NOT DETECTED Final  Comment: (NOTE) The GeneXpert MRSA Assay (FDA approved for NASAL specimens only), is one component of a comprehensive MRSA colonization surveillance program. It is not intended to diagnose MRSA infection nor to guide or monitor treatment for MRSA infections. Test performance is not FDA approved in patients less than 28 years old. Performed at [facility], [address] [place], [place] [address]      Labs: BNP (last 3 results) Recent Labs     08/12/23 2228 08/21/23 2015  BNP 217.0* 301.9*   Basic Metabolic Panel: Recent Labs  Lab 08/21/23 2015 08/22/23 0055 08/22/23 0357 08/23/23 0939 08/24/23 0514 08/25/23 0534  NA 137  --  139 140 139 138  K 3.8  --  3.9 4.1 3.6 3.6  CL 102  --  104 106 107 101  CO2 26  --  26 25 24 27   GLUCOSE 120*  --  85 104* 116* 103*  BUN 23  --  21 24* 26* 29*  CREATININE 0.98  --  1.01 1.05 0.99 1.40*  CALCIUM 8.5*  --  8.3* 8.4* 8.4* 7.9*  MG  --  1.7 1.8 2.1 2.1 2.1  PHOS  --  3.4 3.5 3.3 3.2 3.7   Liver Function Tests: Recent Labs  Lab 08/21/23 2015 08/22/23 0357 08/23/23 0939 08/24/23 0514 08/25/23 0534  AST 49* 46* 38  --   --   ALT 47* 44 44  --   --   ALKPHOS 66 60 63  --   --   BILITOT 0.4 0.7 0.7  --   --   PROT 6.2* 5.6* 6.1*  --   --   ALBUMIN 3.5 3.0* 3.2* 3.0* 2.9*   No results for input(s): "LIPASE", "AMYLASE" in the last 168 hours. Recent Labs  Lab 08/22/23 0055  AMMONIA 24   CBC: Recent Labs  Lab 08/21/23 2015 08/22/23 0357 08/23/23 0939 08/24/23 0514 08/25/23 0534  WBC 3.7* 3.8* 4.0 4.1 4.3  NEUTROABS 2.8  --  2.5  --   --   HGB 10.9* 9.7* 11.1* 11.1* 10.0*  HCT 33.6* 29.8* 34.4* 35.0* 30.9*  MCV 94.1 93.1 95.8 97.2 94.5  PLT 122* 112* 150 142* 142*   Cardiac Enzymes: Recent Labs  Lab 08/22/23 0055  CKTOTAL 68   BNP: Invalid input(s): "POCBNP" CBG: No results for input(s): "GLUCAP" in the last 168 hours. D-Dimer No results for input(s): "DDIMER" in the last 72 hours. Hgb A1c No results for input(s): "HGBA1C" in the last 72 hours. Lipid Profile No results for input(s): "CHOL", "HDL", "LDLCALC", "TRIG", "CHOLHDL", "LDLDIRECT" in the last 72 hours. Thyroid function studies No results for input(s): "TSH", "T4TOTAL", "T3FREE", "THYROIDAB" in the last 72 hours.  Invalid input(s): "FREET3" Anemia work up No results for input(s): "VITAMINB12", "FOLATE", "FERRITIN", "TIBC", "IRON", "RETICCTPCT" in the last 72 hours. Urinalysis     Component Value Date/Time   COLORURINE STRAW (A) 08/21/2023 1953   APPEARANCEUR CLEAR 08/21/2023 1953   LABSPEC 1.005 08/21/2023 1953   PHURINE 5.0 08/21/2023 1953   GLUCOSEU NEGATIVE 08/21/2023 1953   HGBUR MODERATE (A) 08/21/2023 1953   BILIRUBINUR NEGATIVE 08/21/2023 1953   KETONESUR NEGATIVE 08/21/2023 1953   PROTEINUR NEGATIVE 08/21/2023 1953   NITRITE NEGATIVE 08/21/2023 1953   LEUKOCYTESUR NEGATIVE 08/21/2023 1953   Sepsis Labs Recent Labs  Lab 08/22/23 0357 08/23/23 0939 08/24/23 0514 08/25/23 0534  WBC 3.8* 4.0 4.1 4.3   Microbiology Recent Results (from the past 240 hour(s))  Blood Culture (routine x 2)  Status: None (Preliminary result)   Collection Time: 08/21/23  7:52 PM   Specimen: BLOOD LEFT ARM  Result Value Ref Range Status   Specimen Description   Final    BLOOD LEFT ARM Performed at [facility], [address], [place], [place] [address]    Special Requests   Final    BOTTLES DRAWN AEROBIC AND ANAEROBIC Blood Culture adequate volume Performed at [facility], [address] [place], [place] [address]    Culture   Final    NO GROWTH 4 DAYS Performed at [facility], [address] [place], [place] [address]    Report Status PENDING  Incomplete  Blood Culture (routine x 2)     Status: None (Preliminary result)   Collection Time: 08/21/23  8:15 PM   Specimen: Right Antecubital; Blood  Result Value Ref Range Status   Specimen Description   Final    RIGHT ANTECUBITAL Performed at [facility], [address] [place], [place] [address]    Special Requests   Final    BOTTLES DRAWN AEROBIC AND ANAEROBIC Blood Culture adequate volume Performed at [facility], [address], [place], [place] [address]    Culture   Final    NO GROWTH 4 DAYS Performed at [facility], [address], [place], [place] [address]    Report Status PENDING  Incomplete  Resp  panel by RT-PCR (RSV, Flu A&B, Covid) Anterior Nasal Swab     Status: None   Collection Time: 08/21/23  8:30 PM   Specimen: Anterior Nasal Swab  Result Value Ref Range Status   SARS Coronavirus 2 by RT PCR NEGATIVE NEGATIVE Final    Comment: (NOTE) SARS-CoV-2 target nucleic acids are NOT DETECTED.  The SARS-CoV-2 RNA is generally detectable in upper respiratory specimens during the acute phase of infection. The lowest concentration of SARS-CoV-2 viral copies this assay can detect is 138 copies/mL. A negative result does not preclude SARS-Cov-2 infection and should not be used as the sole basis for treatment or other patient management decisions. A negative result may occur with  improper specimen collection/handling, submission of specimen other than nasopharyngeal swab, presence of viral mutation(s) within the areas targeted by this assay, and inadequate number of viral copies(<138 copies/mL). A negative result must be combined with clinical observations, patient history, and epidemiological information. The expected result is Negative.  Fact Sheet for Patients:  BloggerCourse.com  Fact Sheet for Healthcare Providers:  SeriousBroker.it  This test is no t yet approved or cleared by the [place] FDA and  has been authorized for detection and/or diagnosis of SARS-CoV-2 by FDA under an Emergency Use Authorization (EUA). This EUA will remain  in effect (meaning this test can be used) for the duration of the COVID-19 declaration under Section 564(b)(1) of the Act, 21 U.S.C.section 360bbb-3(b)(1), unless the authorization is terminated  or revoked sooner.       Influenza A by PCR NEGATIVE NEGATIVE Final   Influenza B by PCR NEGATIVE NEGATIVE Final    Comment: (NOTE) The Xpert Xpress SARS-CoV-2/FLU/RSV plus assay is intended as an aid in the diagnosis of influenza from Nasopharyngeal swab specimens and should not be used as a  sole basis for treatment. Nasal washings and aspirates are unacceptable for Xpert Xpress SARS-CoV-2/FLU/RSV testing.  Fact Sheet for Patients: BloggerCourse.com  Fact Sheet for Healthcare Providers: SeriousBroker.it  This test is not yet approved or cleared by the [place] FDA  and has been authorized for detection and/or diagnosis of SARS-CoV-2 by FDA under an Emergency Use Authorization (EUA). This EUA will remain in effect (meaning this test can be used) for the duration of the COVID-19 declaration under Section 564(b)(1) of the Act, 21 U.S.C. section 360bbb-3(b)(1), unless the authorization is terminated or revoked.     Resp Syncytial Virus by PCR NEGATIVE NEGATIVE Final    Comment: (NOTE) Fact Sheet for Patients: BloggerCourse.com  Fact Sheet for Healthcare Providers: SeriousBroker.it  This test is not yet approved or cleared by the [place] FDA and has been authorized for detection and/or diagnosis of SARS-CoV-2 by FDA under an Emergency Use Authorization (EUA). This EUA will remain in effect (meaning this test can be used) for the duration of the COVID-19 declaration under Section 564(b)(1) of the Act, 21 U.S.C. section 360bbb-3(b)(1), unless the authorization is terminated or revoked.  Performed at [facility], [address] [place], [place] [address]   MRSA Next Gen by PCR, Nasal     Status: None   Collection Time: 08/22/23 12:18 AM   Specimen: Nasal Mucosa; Nasal Swab  Result Value Ref Range Status   MRSA by PCR Next Gen NOT DETECTED NOT DETECTED Final    Comment: (NOTE) The GeneXpert MRSA Assay (FDA approved for NASAL specimens only), is one component of a comprehensive MRSA colonization surveillance program. It is not intended to diagnose MRSA infection nor to guide or monitor treatment for MRSA infections. Test performance is not  FDA approved in patients less than 34 years old. Performed at [facility], [address], [place], [place] [address]      Time coordinating discharge: Over 30 minutes  SIGNED:   Azucena Fallen, DO [facility] 08/25/2023, 4:00 PM Pager   If 7PM-7AM, please contact night-coverage [email or web address]

## 2023-08-25 NOTE — Progress Notes (Signed)
Occupational Therapy Treatment Patient Details Name: Barry Burgess MRN: [id number] DOB: 08/22/1926 Today's Date: 08/25/2023   History of present illness Patient is a 89 year old male who presented with hypotension, weakness, and fatigue. Patient was admitted with hypothermia. Recent d/c from hospital on 8/28 for UTI, CAP and sepsis. PMH:HLD, BPH, bilateral lower extremity edema, a fib   OT comments  Patient continues to make progress towards goals with patient standing at sink to complete hygiene tasks for bottom with supervision. Patient needed CGA when completing bilateral hand tasks with no UE support with mild unsteadiness. Patients son was present during session. Patients son endorsed that patient was at a level that they could support at home. Nurse and MD made aware via secure chat. Patient would continue to benefit from skilled OT services at this time while admitted and after d/c to address noted deficits in order to improve overall safety and independence in ADLs. Patient's discharge plan remains appropriate at this time. OT will continue to follow acutely.         If plan is discharge home, recommend the following:  Assistance with cooking/housework;Assist for transportation;Help with stairs or ramp for entrance;A little help with walking and/or transfers;A little help with bathing/dressing/bathroom;Direct supervision/assist for financial management;Direct supervision/assist for medications management   Equipment Recommendations  None recommended by OT       Precautions / Restrictions Precautions Precautions: Fall Restrictions Weight Bearing Restrictions: No       Mobility Bed Mobility Overal bed mobility: Needs Assistance Bed Mobility: Supine to Sit     Supine to sit: Supervision     General bed mobility comments: increased time and cues              Balance Overall balance assessment: Needs assistance Sitting-balance support: Feet supported Sitting  balance-Leahy Scale: Good     Standing balance support: No upper extremity supported, During functional activity Standing balance-Leahy Scale: Fair                             ADL either performed or assessed with clinical judgement   ADL Overall ADL's : Needs assistance/impaired     Grooming: Wash/dry hands;Contact guard assist;Standing Grooming Details (indicate cue type and reason): at sink with incresed time. minor LOB with CGA to maintain standing. son present during session.                 Toilet Transfer: Ambulation;Rolling walker (2 wheels);Supervision/safety   Toileting- Architect and Hygiene: Supervision/safety;Sit to/from stand Toileting - Clothing Manipulation Details (indicate cue type and reason): patient was able to complete hygiene tasks standing at sink in room with cuse to keep washing bottom.       General ADL Comments: son stepped out into hallway at end of session reporting that he has a bed at [facility]". per PT note yesterday and OT notes since admission patient has been recommended HH with continued 24/7 support with family. patients son asked if patient needed SNF rehab? patients son was educated that this therapist believed that patient was at a level that could be support at home with stated level of support as noted above. patients son verbalized understanding.      Cognition Arousal: Alert Behavior During Therapy: WFL for tasks assessed/performed Overall Cognitive Status: Within Functional Limits for tasks assessed         General Comments: patient is HOH but coopertive. patient appeared to be in better spirits  today. HIGH risk of learned helplessness.                   Pertinent Vitals/ Pain       Pain Assessment Pain Assessment: No/denies pain         Frequency           Progress Toward Goals  OT Goals(current goals can now be found in the care plan section)  Progress towards OT goals:  Progressing toward goals     Plan         AM-PAC OT "6 Clicks" Daily Activity     Outcome Measure   Help from another person eating meals?: None Help from another person taking care of personal grooming?: A Little Help from another person toileting, which includes using toliet, bedpan, or urinal?: A Little Help from another person bathing (including washing, rinsing, drying)?: A Little Help from another person to put on and taking off regular upper body clothing?: A Little Help from another person to put on and taking off regular lower body clothing?: A Lot 6 Click Score: 18    End of Session Equipment Utilized During Treatment: Rolling walker (2 wheels);Gait belt  OT Visit Diagnosis: Unsteadiness on feet (R26.81);Other abnormalities of gait and mobility (R26.89);Muscle weakness (generalized) (M62.81)   Activity Tolerance Patient tolerated treatment well   Patient Left in chair;with call bell/phone within reach;with family/visitor present   Nurse Communication Mobility status        Time: 0102-7253 OT Time Calculation (min): 34 min  Charges: OT General Charges $OT Visit: 1 Visit OT Treatments $Self Care/Home Management : 23-37 mins  Rosalio Loud, MS Acute Rehabilitation Department Office# [phone]   Selinda Flavin 08/25/2023, 9:37 AM

## 2023-08-26 LAB — CULTURE, BLOOD (ROUTINE X 2)
Culture: NO GROWTH
Culture: NO GROWTH
Special Requests: ADEQUATE
Special Requests: ADEQUATE

## 2023-08-30 LAB — VITAMIN B1: Vitamin B1 (Thiamine): 67.6 nmol/L (ref 66.5–200.0)

## 2023-08-31 DIAGNOSIS — I5032 Chronic diastolic (congestive) heart failure: Secondary | ICD-10-CM | POA: Diagnosis not present

## 2023-08-31 DIAGNOSIS — N1831 Chronic kidney disease, stage 3a: Secondary | ICD-10-CM | POA: Diagnosis not present

## 2023-08-31 DIAGNOSIS — E782 Mixed hyperlipidemia: Secondary | ICD-10-CM | POA: Diagnosis not present

## 2023-08-31 DIAGNOSIS — E1122 Type 2 diabetes mellitus with diabetic chronic kidney disease: Secondary | ICD-10-CM | POA: Diagnosis not present

## 2023-08-31 DIAGNOSIS — I959 Hypotension, unspecified: Secondary | ICD-10-CM | POA: Diagnosis not present

## 2023-08-31 DIAGNOSIS — I130 Hypertensive heart and chronic kidney disease with heart failure and stage 1 through stage 4 chronic kidney disease, or unspecified chronic kidney disease: Secondary | ICD-10-CM | POA: Diagnosis not present

## 2023-08-31 DIAGNOSIS — I480 Paroxysmal atrial fibrillation: Secondary | ICD-10-CM | POA: Diagnosis not present

## 2023-08-31 DIAGNOSIS — D6869 Other thrombophilia: Secondary | ICD-10-CM | POA: Diagnosis not present

## 2023-10-22 DIAGNOSIS — H524 Presbyopia: Secondary | ICD-10-CM | POA: Diagnosis not present

## 2023-10-22 DIAGNOSIS — H401134 Primary open-angle glaucoma, bilateral, indeterminate stage: Secondary | ICD-10-CM | POA: Diagnosis not present

## 2023-10-22 DIAGNOSIS — H5212 Myopia, left eye: Secondary | ICD-10-CM | POA: Diagnosis not present

## 2023-10-22 DIAGNOSIS — H04123 Dry eye syndrome of bilateral lacrimal glands: Secondary | ICD-10-CM | POA: Diagnosis not present

## 2023-10-22 DIAGNOSIS — H52222 Regular astigmatism, left eye: Secondary | ICD-10-CM | POA: Diagnosis not present

## 2023-10-22 DIAGNOSIS — H353131 Nonexudative age-related macular degeneration, bilateral, early dry stage: Secondary | ICD-10-CM | POA: Diagnosis not present

## 2023-11-15 NOTE — Progress Notes (Unsigned)
Date:  11/15/2023   ID:  Barry Burgess, DOB Jun 03, 1926, MRN [id number]  PCP:  [name], MD  Cardiologist:   Eden Emms Electrophysiologist:  None   Evaluation Performed:  Follow-Up Visit  Chief Complaint:  Afib  History of Present Illness:    89 y.o. y.o. history of HLD, HTN, and PAF. CHA2DVASC 3 on eliquis for anticoagulation. Last [facility] 10/2015 Still watching "[employer] " and has several history degrees. 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 He was massively volume Overloaded. Diuresed 30 lbs and cardizem held for slow afib Una boots applied to LEls  Had gone done hill quite a bit and not out of house much due to COVID Losartan held for low BP Cardizem stopped due to slow afib D/c with lasix 40 mg bid and Cr 1.3 at D/c    Living with Barry Burgess now At this point with age and debility should be transitioning to palliative type care  New address is [address] [place] [facility] [place] [facility]   Exercising now Walking with dog Psychologist, educational who is a Adult nurse exercise bike indoors Seen in ED 10/24/22 with right lower leg pain after foot slipped off pedal Venous duplex negative DVT   Long talk about his upbringing in [place]   Lasix decreased last visit as PO intake was poor Hospitalized with SIRS in September 2024 ? Pneumonia with aspiration   ***  Past Medical History:  Diagnosis Date   Aortic insufficiency    Atrial fibrillation with RVR (HCC) 10/14/2015   BPH (benign prostatic hyperplasia)    Glaucoma    Hyperlipidemia    Hypertension    Mitral regurgitation    PAF (paroxysmal atrial fibrillation) (HCC)    Tricuspid regurgitation    Past Surgical History:  Procedure Laterality Date   CARDIOVERSION N/A 11/13/2015   Procedure: CARDIOVERSION;  Surgeon: Wendall Stade, MD;  Location: [facility] ENDOSCOPY;  Service: Cardiovascular;  Laterality: N/A;   EYE  SURGERY     bilateral cataracts   PILONIDAL CYST / SINUS EXCISION       No outpatient medications have been marked as taking for the 11/29/23 encounter (Appointment) with Wendall Stade, MD.     Allergies:   Patient has no known allergies.   Social History   Tobacco Use   Smoking status: Former    Types: Pipe, Cigars    Quit date: 10/13/1962    Years since quitting: 61.1   Smokeless tobacco: Never  Vaping Use   Vaping status: Never Used  Substance Use Topics   Alcohol use: Not Currently   Drug use: No     Family Hx: The patient's family history includes Stroke (age of onset: 28) in his father.  ROS:   Please see the history of present illness.     All other systems reviewed and are negative.   Prior CV studies:   The following studies were reviewed today:  Echo 05/12/22 : IMPRESSIONS     1. Left ventricular ejection fraction, by estimation, is 50 to 55%. The  left ventricle has low normal function. The left ventricle demonstrates  global hypokinesis. Left ventricular diastolic parameters are  indeterminate.   2. Right ventricular systolic function is normal. The right ventricular  size is severely enlarged. Mildly increased right ventricular wall  thickness.   3. Left atrial size was severely dilated.  4. Right atrial size was severely dilated.   5. The mitral valve is grossly normal. No evidence of mitral valve  regurgitation. No evidence of mitral stenosis.   6. Tricuspid valve regurgitation is mild to moderate and atrial  functional in nature.   7. The aortic valve is tricuspid. There is moderate calcification of the  aortic valve. There is mild thickening of the aortic valve. Aortic valve  regurgitation is trivial. Aortic valve sclerosis is present, with no  evidence of aortic valve stenosis.   Comparison(s): Unable to access 2018 study, further atrial dilation from  prior report.     Labs/Other Tests and Data Reviewed:    EKG:   afib rate 92  nonspecific ST changes 2018  Recent Labs: 08/21/2023: B Natriuretic Peptide 301.9 08/22/2023: TSH 5.140 08/23/2023: ALT 44 08/25/2023: BUN 29; Creatinine, Ser 1.40; Hemoglobin 10.0; Magnesium 2.1; Platelets 142; Potassium 3.6; Sodium 138   Recent Lipid Panel Lab Results  Component Value Date/Time   CHOL 93 10/15/2015 02:14 AM   TRIG 27 10/15/2015 02:14 AM   HDL 53 10/15/2015 02:14 AM   CHOLHDL 1.8 10/15/2015 02:14 AM   LDLCALC 35 10/15/2015 02:14 AM    Wt Readings from Last 3 Encounters:  08/24/23 190 lb 0.6 oz (86.2 kg)  08/13/23 180 lb 1.9 oz (81.7 kg)  06/17/23 166 lb 6.4 oz (75.5 kg)     Objective:    Vital Signs:  There were no vitals taken for this visit.   Affect appropriate Frail elderly male  HEENT: normal Neck supple with no adenopathy JVP normal no bruits no thyromegaly Lungs clear with no wheezing and good diaphragmatic motion Heart:  S1/S2 no murmur, no rub, gallop or click PMI normal Abdomen: benighn, BS positve, no tenderness, no AAA no bruit.  No HSM or HJR Distal pulses intact with no bruits Plus one bilateral  edema Neuro non-focal Skin warm and dry No muscular weakness   ASSESSMENT & PLAN:    1. PAF - asymptomatic continue anticoagulation with eliquis  GFR is 50 and age will reduce dose to 2.5 mg bid    2. HTN: losartan d/c    3. AR:  Trivial by most recent echo 05/12/22     4. Diastolic Dysfunction/Right heart failure :  he appears dry decrease lasix to daily  5. Weakness:  Hct 30.9, some azotemia before lasix d/c ACTH stim test normal   6. Dental:  hold eliquis 2 days before dental procedure he is on antibiotics   ***  Disposition:  Follow up in 6 months    Signed, Charlton Haws, MD  11/15/2023 8:02 AM    [facility]

## 2023-11-24 ENCOUNTER — Telehealth: Payer: Self-pay | Admitting: Cardiovascular Disease

## 2023-11-24 NOTE — Telephone Encounter (Signed)
Left voicemail to return call to office.

## 2023-11-24 NOTE — Telephone Encounter (Signed)
Patient is currently living in an independent living facility. RN Doreene Burke at hospice would like to know if the patient would still need to come to the appointment on 12/09 considering where he is currently living. Please advise.

## 2023-11-24 NOTE — Telephone Encounter (Signed)
Called Hospice nurse and informed her that appointment is not necessary.

## 2023-11-29 ENCOUNTER — Ambulatory Visit: Payer: Medicare Other | Admitting: Cardiovascular Disease

## 2024-02-02 ENCOUNTER — Encounter: Payer: Self-pay | Admitting: Family Medicine

## 2024-03-03 ENCOUNTER — Emergency Department (HOSPITAL_COMMUNITY)
Admission: EM | Admit: 2024-03-03 | Discharge: 2024-03-03 | Disposition: A | Discharge status: Home / Self Care | Attending: Emergency Medicine | Admitting: Emergency Medicine

## 2024-03-03 ENCOUNTER — Emergency Department (HOSPITAL_COMMUNITY)

## 2024-03-03 ENCOUNTER — Encounter (HOSPITAL_COMMUNITY): Payer: Self-pay | Admitting: Emergency Medicine

## 2024-03-03 DIAGNOSIS — R059 Cough, unspecified: Secondary | ICD-10-CM | POA: Diagnosis not present

## 2024-03-03 DIAGNOSIS — I110 Hypertensive heart disease with heart failure: Secondary | ICD-10-CM | POA: Diagnosis not present

## 2024-03-03 DIAGNOSIS — R531 Weakness: Secondary | ICD-10-CM | POA: Insufficient documentation

## 2024-03-03 DIAGNOSIS — R300 Dysuria: Secondary | ICD-10-CM | POA: Diagnosis not present

## 2024-03-03 DIAGNOSIS — R0989 Other specified symptoms and signs involving the circulatory and respiratory systems: Secondary | ICD-10-CM | POA: Diagnosis not present

## 2024-03-03 DIAGNOSIS — Z87891 Personal history of nicotine dependence: Secondary | ICD-10-CM | POA: Insufficient documentation

## 2024-03-03 DIAGNOSIS — Z7901 Long term (current) use of anticoagulants: Secondary | ICD-10-CM | POA: Diagnosis not present

## 2024-03-03 DIAGNOSIS — I5032 Chronic diastolic (congestive) heart failure: Secondary | ICD-10-CM | POA: Insufficient documentation

## 2024-03-03 HISTORY — DX: Heart failure, unspecified: I50.9

## 2024-03-03 LAB — URINALYSIS, ROUTINE W REFLEX MICROSCOPIC
Bilirubin Urine: NEGATIVE
Glucose, UA: NEGATIVE mg/dL
Hgb urine dipstick: NEGATIVE
Ketones, ur: 5 mg/dL — AB
Leukocytes,Ua: NEGATIVE
Nitrite: NEGATIVE
Protein, ur: NEGATIVE mg/dL
Specific Gravity, Urine: 1.008 (ref 1.005–1.030)
pH: 6 (ref 5.0–8.0)

## 2024-03-03 LAB — COMPREHENSIVE METABOLIC PANEL
ALT: 26 U/L (ref 0–44)
AST: 34 U/L (ref 15–41)
Albumin: 3.4 g/dL — ABNORMAL LOW (ref 3.5–5.0)
Alkaline Phosphatase: 67 U/L (ref 38–126)
Anion gap: 14 (ref 5–15)
BUN: 45 mg/dL — ABNORMAL HIGH (ref 8–23)
CO2: 24 mmol/L (ref 22–32)
Calcium: 8.6 mg/dL — ABNORMAL LOW (ref 8.9–10.3)
Chloride: 101 mmol/L (ref 98–111)
Creatinine, Ser: 1.67 mg/dL — ABNORMAL HIGH (ref 0.61–1.24)
GFR, Estimated: 37 mL/min — ABNORMAL LOW (ref >60)
Glucose, Bld: 67 mg/dL — ABNORMAL LOW (ref 70–99)
Potassium: 3.6 mmol/L (ref 3.5–5.1)
Sodium: 139 mmol/L (ref 135–145)
Total Bilirubin: 1.2 mg/dL (ref 0.0–1.2)
Total Protein: 6 g/dL — ABNORMAL LOW (ref 6.5–8.1)

## 2024-03-03 LAB — CBC WITH DIFFERENTIAL/PLATELET
Abs Immature Granulocytes: 0.04 10*3/uL (ref 0.00–0.07)
Basophils Absolute: 0 10*3/uL (ref 0.0–0.1)
Basophils Relative: 0 %
Eosinophils Absolute: 0 10*3/uL (ref 0.0–0.5)
Eosinophils Relative: 1 %
HCT: 32.3 % — ABNORMAL LOW (ref 39.0–52.0)
Hemoglobin: 10.1 g/dL — ABNORMAL LOW (ref 13.0–17.0)
Immature Granulocytes: 1 %
Lymphocytes Relative: 16 %
Lymphs Abs: 0.7 10*3/uL (ref 0.7–4.0)
MCH: 30.4 pg (ref 26.0–34.0)
MCHC: 31.3 g/dL (ref 30.0–36.0)
MCV: 97.3 fL (ref 80.0–100.0)
Monocytes Absolute: 0.5 10*3/uL (ref 0.1–1.0)
Monocytes Relative: 11 %
Neutro Abs: 3.1 10*3/uL (ref 1.7–7.7)
Neutrophils Relative %: 71 %
Platelets: 102 10*3/uL — ABNORMAL LOW (ref 150–400)
RBC: 3.32 MIL/uL — ABNORMAL LOW (ref 4.22–5.81)
RDW: 16.6 % — ABNORMAL HIGH (ref 11.5–15.5)
WBC: 4.4 10*3/uL (ref 4.0–10.5)
nRBC: 0 % (ref 0.0–0.2)

## 2024-03-03 LAB — TROPONIN I (HIGH SENSITIVITY): Troponin I (High Sensitivity): 33 ng/L — ABNORMAL HIGH (ref <18)

## 2024-03-03 LAB — BRAIN NATRIURETIC PEPTIDE: B Natriuretic Peptide: 470 pg/mL — ABNORMAL HIGH (ref 0.0–100.0)

## 2024-03-03 NOTE — ED Provider Notes (Signed)
 [place] EMERGENCY DEPARTMENT AT [facility] Provider Note  CSN: [id number] Arrival date & time: 03/03/24 1133  Chief Complaint(s) Weakness  HPI Barry Burgess is a 89 y.o. male who is here today for weakness.  Patient is a hospice patient at [facility], lives in independent living, but now has a a live-in caregiver.  Patient is here today for burning with urination, feeling weak over the last few days.  He is concerned that he has some extra fluid on him.   Past Medical History Past Medical History:  Diagnosis Date   Aortic insufficiency    Atrial fibrillation with RVR (HCC) 10/14/2015   BPH (benign prostatic hyperplasia)    CHF (congestive heart failure) (HCC)    Glaucoma    Hyperlipidemia    Hypertension    Mitral regurgitation    PAF (paroxysmal atrial fibrillation) (HCC)    Tricuspid regurgitation    Patient Active Problem List   Diagnosis Date Noted   Bronchopneumonia 08/24/2023   Severe sepsis (HCC) 08/21/2023   Chronic diastolic CHF (congestive heart failure) (HCC) 08/21/2023   Ambulatory dysfunction 08/17/2023   Acute metabolic encephalopathy 08/13/2023   Sepsis secondary to UTI (HCC) 08/13/2023   Hypothermia 08/13/2023   Persistent atrial fibrillation (HCC) 05/11/2022   CHF exacerbation (HCC) 05/11/2022   Atrial fibrillation with slow ventricular response (HCC) 05/11/2022   AKI (acute kidney injury) (HCC) 05/11/2022   HTN (hypertension) 10/14/2015   HLD (hyperlipidemia) 10/14/2015   BPH (benign prostatic hyperplasia)    Home Medication(s) Prior to Admission medications   Medication Sig Start Date End Date Taking? Authorizing Provider  acetaminophen (TYLENOL) 325 MG tablet Take 2 tablets (650 mg total) by mouth every 6 (six) hours as needed for mild pain (or Fever >/= 101). 08/25/23   Azucena Fallen, MD  amoxicillin-clavulanate (AUGMENTIN) 875-125 MG tablet Take 1 tablet by mouth every 12 (twelve) hours. 08/25/23   Azucena Fallen, MD   dorzolamide-timolol (COSOPT) 22.3-6.8 MG/ML ophthalmic solution Place 1 drop into both eyes 2 (two) times daily. 10/07/15   [provider]  doxazosin (CARDURA) 4 MG tablet Take 4 mg by mouth daily. 10/11/15   [provider]  ELIQUIS 5 MG TABS tablet TAKE 1 TABLET BY MOUTH TWICE  DAILY 04/26/23   Wendall Stade, MD  ferrous sulfate 325 (65 FE) MG tablet Take 1 tablet (325 mg total) by mouth 2 (two) times daily with a meal. 08/25/23   Azucena Fallen, MD  finasteride (PROSCAR) 5 MG tablet Take 5 mg by mouth daily. 08/14/15   [provider]  folic acid (FOLVITE) 1 MG tablet Take 1 tablet (1 mg total) by mouth daily. 08/26/23   Azucena Fallen, MD  furosemide (LASIX) 40 MG tablet Take 1 tablet (40 mg total) by mouth 2 (two) times daily. 08/04/23   Wendall Stade, MD  latanoprost (XALATAN) 0.005 % ophthalmic solution Place 1 drop into both eyes every evening. 10/07/15   [provider]  midodrine (PROAMATINE) 5 MG tablet Take 1 tablet (5 mg total) by mouth 3 (three) times daily with meals. 08/25/23   Azucena Fallen, MD  pravastatin (PRAVACHOL) 40 MG tablet Take 40 mg by mouth daily.    [provider]  Past Surgical History Past Surgical History:  Procedure Laterality Date   CARDIOVERSION N/A 11/13/2015   Procedure: CARDIOVERSION;  Surgeon: Wendall Stade, MD;  Location: [facility] ENDOSCOPY;  Service: Cardiovascular;  Laterality: N/A;   EYE SURGERY     bilateral cataracts   PILONIDAL CYST / SINUS EXCISION     Family History Family History  Problem Relation Age of Onset   Stroke Father 57    Social History Social History   Tobacco Use   Smoking status: Former    Types: Pipe, Cigars    Quit date: 10/13/1962    Years since quitting: 61.4   Smokeless tobacco: Never  Vaping Use   Vaping status: Never Used   Substance Use Topics   Alcohol use: Not Currently   Drug use: No   Allergies Patient has no known allergies.  Review of Systems Review of Systems  Physical Exam Vital Signs  I have reviewed the triage vital signs BP (!) 104/56   Pulse 61   Temp (!) 97.5 F (36.4 C)   Resp 13   Ht 6' (1.829 m)   Wt 85.3 kg   SpO2 94%   BMI 25.50 kg/m   Physical Exam Constitutional:      Appearance: He is not toxic-appearing.  Neurological:     Mental Status: He is alert.     ED Results and Treatments Labs (all labs ordered are listed, but only abnormal results are displayed) Labs Reviewed  COMPREHENSIVE METABOLIC PANEL - Abnormal; Notable for the following components:      Result Value   Glucose, Bld 67 (*)    BUN 45 (*)    Creatinine, Ser 1.67 (*)    Calcium 8.6 (*)    Total Protein 6.0 (*)    Albumin 3.4 (*)    GFR, Estimated 37 (*)    All other components within normal limits  CBC WITH DIFFERENTIAL/PLATELET - Abnormal; Notable for the following components:   RBC 3.32 (*)    Hemoglobin 10.1 (*)    HCT 32.3 (*)    RDW 16.6 (*)    Platelets 102 (*)    All other components within normal limits  URINALYSIS, ROUTINE W REFLEX MICROSCOPIC - Abnormal; Notable for the following components:   Color, Urine STRAW (*)    Ketones, ur 5 (*)    Bacteria, UA RARE (*)    All other components within normal limits  BRAIN NATRIURETIC PEPTIDE - Abnormal; Notable for the following components:   B Natriuretic Peptide 470.0 (*)    All other components within normal limits  TROPONIN I (HIGH SENSITIVITY) - Abnormal; Notable for the following components:   Troponin I (High Sensitivity) 33 (*)    All other components within normal limits                                                                                                                          Radiology No results found.  Pertinent labs & imaging  results that were available during my care of the patient were reviewed by me and  considered in my medical decision making (see MDM for details).  Medications Ordered in ED Medications - No data to display                                                                                                                                   Procedures Procedures  (including critical care time)  Medical Decision Making / ED Course   This patient presents to the ED for concern of weakness, this involves an extensive number of treatment options, and is a complaint that carries with it a high risk of complications and morbidity.  The differential diagnosis includes fluid overload, cystitis, underlying infection, less likely traumatic injury, anemia.  MDM: On exam of the patient, he overall looks well.  Patient is normotensive, afebrile, nontachycardic.  His O2 sat is 94% while asleep.  Triage note stated was a SpO2 of 88%, however in my entire time in the patient's room, his O2 saturation never dropped below 94% even with current conversation.  Patient has some mild lower extremity edema, this is consistent with a 16 year old patient who is in hospice for his CHF.  Reviewed the patient's medications, and concern with caregiver at bedside.  I was able to reach out to the patient's hospice team.  Discussed patient's care, and do believe he would benefit from some additional diuresis over the next couple of days.  They will help facilitate that, will check in on the patient.  Patient has close follow-up, now has a Surveyor, mining.  Patient is appropriate for discharge back to his facility.  With the patient being a hospice patient, his medical comorbidities would make him at greater risk to develop hospital acquired illness, so believe close follow-up is a safer disposition.  Patient sugar was on the low side at 67, however he was able to eat and drink p.o. in the emergency room without any difficulty.  His renal function is roughly at his baseline.  My dependent review the patient's chest  x-ray does show some mild pulmonary edema.     Additional history obtained: -Additional history obtained from hospice nurse, caregiver, family at bedside -External records from outside source obtained and reviewed including: Chart review including previous notes, labs, imaging, consultation notes   Lab Tests: -I ordered, reviewed, and interpreted labs.   The pertinent results include:   Labs Reviewed  COMPREHENSIVE METABOLIC PANEL - Abnormal; Notable for the following components:      Result Value   Glucose, Bld 67 (*)    BUN 45 (*)    Creatinine, Ser 1.67 (*)    Calcium 8.6 (*)    Total Protein 6.0 (*)    Albumin 3.4 (*)    GFR, Estimated 37 (*)    All other components within normal limits  CBC WITH DIFFERENTIAL/PLATELET - Abnormal; Notable for the following components:  RBC 3.32 (*)    Hemoglobin 10.1 (*)    HCT 32.3 (*)    RDW 16.6 (*)    Platelets 102 (*)    All other components within normal limits  URINALYSIS, ROUTINE W REFLEX MICROSCOPIC - Abnormal; Notable for the following components:   Color, Urine STRAW (*)    Ketones, ur 5 (*)    Bacteria, UA RARE (*)    All other components within normal limits  BRAIN NATRIURETIC PEPTIDE - Abnormal; Notable for the following components:   B Natriuretic Peptide 470.0 (*)    All other components within normal limits  TROPONIN I (HIGH SENSITIVITY) - Abnormal; Notable for the following components:   Troponin I (High Sensitivity) 33 (*)    All other components within normal limits      Imaging Studies ordered: I ordered imaging studies including chest x-ray I independently visualized and interpreted imaging. I agree with the radiologist interpretation   Medicines ordered and prescription drug management: No orders of the defined types were placed in this encounter.   -I have reviewed the patients home medicines and have made adjustments as needed    Cardiac Monitoring: The patient was maintained on a cardiac  monitor.  I personally viewed and interpreted the cardiac monitored which showed an underlying rhythm of: Normal sinus rhythm  Social Determinants of Health:  Factors impacting patients care include: Hospice patient   Reevaluation: After the interventions noted above, I reevaluated the patient and found that they have :improved  Co morbidities that complicate the patient evaluation  Past Medical History:  Diagnosis Date   Aortic insufficiency    Atrial fibrillation with RVR (HCC) 10/14/2015   BPH (benign prostatic hyperplasia)    CHF (congestive heart failure) (HCC)    Glaucoma    Hyperlipidemia    Hypertension    Mitral regurgitation    PAF (paroxysmal atrial fibrillation) (HCC)    Tricuspid regurgitation       Dispostion: I considered admission for this patient, however believe patient is more appropriate for outpatient follow-up with his hospice team.     Final Clinical Impression(s) / ED Diagnoses Final diagnoses:  Generalized weakness     @PCDICTATION @    Anders Simmonds T, DO 03/03/24 1456

## 2024-03-03 NOTE — Discharge Instructions (Signed)
 While Mr. Barry Burgess was in the emergency room he had blood work done that was overall normal for him.  His chest x-ray shows small amount of fluid on his lungs, but was not very different from previous x-rays.  His urine does not have infection.  I have made some changes to his medications.  I would like him to take 80 mg of Lasix once in the morning and once in the evening for the next 3 days.  After that, he may return to taking 40 mg once in the morning and once in the evening.  The hospice team will check up on you tomorrow.  Return to the emergency room if you develop any increased difficulty with your breathing or fever.

## 2024-03-03 NOTE — ED Notes (Signed)
 Son went home to change vehicles

## 2024-03-03 NOTE — Progress Notes (Signed)
 [facility] ED [facility]       This patient is a current hospice patient with [facility], admitted 9.12.24 with a terminal diagnosis of chronic diastolic heart failure   [facility] will continue to follow for any discharge planning needs and to coordinate continuation of hospice care.    Please don't hesitate to call with any Hospice related questions or concerns.    Henderson Newcomer, LPN [facility] Liaison [phone]

## 2024-03-03 NOTE — ED Triage Notes (Signed)
 Patient bib per hospice from [facility] independent living. Patient keeps calling family complaints of weakness and burning with urination,

## 2024-03-05 ENCOUNTER — Other Ambulatory Visit: Payer: Self-pay | Admitting: Cardiovascular Disease

## 2024-03-05 DIAGNOSIS — I4891 Unspecified atrial fibrillation: Secondary | ICD-10-CM

## 2024-03-06 NOTE — Telephone Encounter (Signed)
 Prescription refill request for Eliquis received. Indication:afib Last office visit:6/24 Scr:1.67  3/25 Age: 89 Weight:85.3  kg  Prescription refilled

## 2024-03-23 DIAGNOSIS — H6123 Impacted cerumen, bilateral: Secondary | ICD-10-CM | POA: Diagnosis not present

## 2024-05-29 DIAGNOSIS — I5032 Chronic diastolic (congestive) heart failure: Secondary | ICD-10-CM | POA: Diagnosis not present

## 2024-07-03 DIAGNOSIS — G8929 Other chronic pain: Secondary | ICD-10-CM | POA: Diagnosis not present

## 2024-07-11 DIAGNOSIS — I509 Heart failure, unspecified: Secondary | ICD-10-CM | POA: Diagnosis not present

## 2024-07-11 DIAGNOSIS — G8929 Other chronic pain: Secondary | ICD-10-CM | POA: Diagnosis not present

## 2024-07-11 DIAGNOSIS — Z515 Encounter for palliative care: Secondary | ICD-10-CM | POA: Diagnosis not present

## 2024-07-11 DIAGNOSIS — I4891 Unspecified atrial fibrillation: Secondary | ICD-10-CM | POA: Diagnosis not present

## 2024-07-11 DIAGNOSIS — R451 Restlessness and agitation: Secondary | ICD-10-CM | POA: Diagnosis not present

## 2024-07-11 DIAGNOSIS — I951 Orthostatic hypotension: Secondary | ICD-10-CM | POA: Diagnosis not present

## 2024-07-11 DIAGNOSIS — K5900 Constipation, unspecified: Secondary | ICD-10-CM | POA: Diagnosis not present

## 2024-07-11 DIAGNOSIS — E876 Hypokalemia: Secondary | ICD-10-CM | POA: Diagnosis not present

## 2024-07-24 DIAGNOSIS — G8929 Other chronic pain: Secondary | ICD-10-CM | POA: Diagnosis not present

## 2024-08-07 DIAGNOSIS — G8929 Other chronic pain: Secondary | ICD-10-CM | POA: Diagnosis not present

## 2024-08-21 DIAGNOSIS — Z515 Encounter for palliative care: Secondary | ICD-10-CM | POA: Diagnosis not present

## 2024-08-21 DIAGNOSIS — I509 Heart failure, unspecified: Secondary | ICD-10-CM | POA: Diagnosis not present

## 2024-08-21 DIAGNOSIS — I4891 Unspecified atrial fibrillation: Secondary | ICD-10-CM | POA: Diagnosis not present

## 2024-08-24 DIAGNOSIS — Z0000 Encounter for general adult medical examination without abnormal findings: Secondary | ICD-10-CM | POA: Diagnosis not present

## 2024-09-04 DIAGNOSIS — Z515 Encounter for palliative care: Secondary | ICD-10-CM | POA: Diagnosis not present

## 2024-09-04 DIAGNOSIS — G8929 Other chronic pain: Secondary | ICD-10-CM | POA: Diagnosis not present

## 2024-09-18 DIAGNOSIS — G8929 Other chronic pain: Secondary | ICD-10-CM | POA: Diagnosis not present

## 2024-09-18 DIAGNOSIS — Z515 Encounter for palliative care: Secondary | ICD-10-CM | POA: Diagnosis not present

## 2024-11-20 DEATH — deceased
